# Patient Record
Sex: Female | Born: 1965
Health system: Southern US, Community
[De-identification: ages and names within clinical notes are randomized; demographics above are authoritative.]

## PROBLEM LIST (undated history)

## (undated) DIAGNOSIS — Z8489 Family history of other specified conditions: Secondary | ICD-10-CM

## (undated) DIAGNOSIS — I2510 Atherosclerotic heart disease of native coronary artery without angina pectoris: Secondary | ICD-10-CM

## (undated) DIAGNOSIS — F32A Depression, unspecified: Secondary | ICD-10-CM

## (undated) DIAGNOSIS — E119 Type 2 diabetes mellitus without complications: Secondary | ICD-10-CM

## (undated) DIAGNOSIS — E785 Hyperlipidemia, unspecified: Secondary | ICD-10-CM

## (undated) DIAGNOSIS — F419 Anxiety disorder, unspecified: Secondary | ICD-10-CM

## (undated) DIAGNOSIS — I10 Essential (primary) hypertension: Secondary | ICD-10-CM

## (undated) DIAGNOSIS — I255 Ischemic cardiomyopathy: Secondary | ICD-10-CM

## (undated) DIAGNOSIS — I219 Acute myocardial infarction, unspecified: Secondary | ICD-10-CM

## (undated) DIAGNOSIS — M797 Fibromyalgia: Secondary | ICD-10-CM

## (undated) DIAGNOSIS — F329 Major depressive disorder, single episode, unspecified: Secondary | ICD-10-CM

## (undated) DIAGNOSIS — K219 Gastro-esophageal reflux disease without esophagitis: Secondary | ICD-10-CM

## (undated) HISTORY — DX: Essential (primary) hypertension: I10

## (undated) HISTORY — PX: TONSILLECTOMY: SUR1361

## (undated) HISTORY — DX: Fibromyalgia: M79.7

## (undated) HISTORY — DX: Type 2 diabetes mellitus without complications: E11.9

## (undated) HISTORY — PX: TUBAL LIGATION: SHX77

## (undated) HISTORY — PX: HERNIA REPAIR: SHX51

---

## 2013-06-13 DIAGNOSIS — R42 Dizziness and giddiness: Secondary | ICD-10-CM

## 2013-09-28 ENCOUNTER — Ambulatory Visit (HOSPITAL_COMMUNITY): Payer: Commercial Indemnity | Admitting: Psychiatry

## 2013-10-10 ENCOUNTER — Ambulatory Visit (INDEPENDENT_AMBULATORY_CARE_PROVIDER_SITE_OTHER): Payer: 59 | Admitting: Psychiatry

## 2013-10-10 ENCOUNTER — Encounter (HOSPITAL_COMMUNITY): Payer: Self-pay | Admitting: Psychiatry

## 2013-10-10 VITALS — BP 130/80 | Ht 58.75 in | Wt 160.0 lb

## 2013-10-10 DIAGNOSIS — F418 Other specified anxiety disorders: Secondary | ICD-10-CM | POA: Insufficient documentation

## 2013-10-10 DIAGNOSIS — F332 Major depressive disorder, recurrent severe without psychotic features: Secondary | ICD-10-CM

## 2013-10-10 DIAGNOSIS — F411 Generalized anxiety disorder: Secondary | ICD-10-CM

## 2013-10-10 MED ORDER — CLONAZEPAM 0.5 MG PO TABS: 0.5000 mg | ORAL_TABLET | Freq: Every day | ORAL | Status: DC

## 2013-10-10 MED ORDER — DULOXETINE HCL 60 MG PO CPEP: 60.0000 mg | ORAL_CAPSULE | Freq: Every day | ORAL | Status: DC

## 2013-10-10 MED ORDER — DULOXETINE HCL 30 MG PO CPEP: 30.0000 mg | ORAL_CAPSULE | Freq: Every day | ORAL | Status: DC

## 2013-10-10 MED ORDER — TOPIRAMATE 25 MG PO CPSP: ORAL_CAPSULE | ORAL | Status: DC

## 2013-10-10 MED ORDER — TRAZODONE HCL 150 MG PO TABS: 150.0000 mg | ORAL_TABLET | Freq: Every day | ORAL | Status: DC
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
## 2013-10-17 ENCOUNTER — Telehealth (HOSPITAL_COMMUNITY): Payer: Self-pay

## 2013-10-17 NOTE — Telephone Encounter (Signed)
Left message 10:30 am

## 2013-10-30 ENCOUNTER — Ambulatory Visit (HOSPITAL_COMMUNITY): Payer: Self-pay | Admitting: Psychiatry

## 2013-11-09 ENCOUNTER — Encounter (HOSPITAL_COMMUNITY): Payer: Self-pay | Admitting: Psychiatry

## 2013-11-09 ENCOUNTER — Ambulatory Visit (INDEPENDENT_AMBULATORY_CARE_PROVIDER_SITE_OTHER): Payer: 59 | Admitting: Psychiatry

## 2013-11-09 VITALS — BP 120/80 | Ht <= 58 in | Wt 156.0 lb

## 2013-11-09 DIAGNOSIS — F418 Other specified anxiety disorders: Secondary | ICD-10-CM

## 2013-11-09 DIAGNOSIS — F332 Major depressive disorder, recurrent severe without psychotic features: Secondary | ICD-10-CM

## 2013-11-09 DIAGNOSIS — F411 Generalized anxiety disorder: Secondary | ICD-10-CM

## 2013-11-09 MED ORDER — ALPRAZOLAM 1 MG PO TABS: 1.0000 mg | ORAL_TABLET | Freq: Three times a day (TID) | ORAL | Status: AC
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
## 2013-12-07 ENCOUNTER — Ambulatory Visit (HOSPITAL_COMMUNITY): Payer: Self-pay | Admitting: Psychiatry

## 2013-12-14 ENCOUNTER — Ambulatory Visit (HOSPITAL_COMMUNITY): Payer: Self-pay | Admitting: Psychiatry

## 2014-06-17 ENCOUNTER — Telehealth (HOSPITAL_COMMUNITY): Payer: Self-pay | Admitting: *Deleted

## 2014-06-17 NOTE — Telephone Encounter (Signed)
Refill for Xanax cannot be sent. Last appt 11/09/13. Nothing scheduled. Needs to be seen to receive medication

## 2014-08-29 ENCOUNTER — Other Ambulatory Visit (HOSPITAL_COMMUNITY): Payer: Self-pay | Admitting: *Deleted

## 2014-08-29 NOTE — Telephone Encounter (Signed)
Called Amy Snow and lmtcb to call our office due to her pharmacy requesting her Klonopin 0.5 mg. Amy Snow last saw Dr. Harrington Challenger 11-09-2013. Pharmacy is aware that Amy Snow have to make an appt to get more refills.

## 2015-07-05 ENCOUNTER — Inpatient Hospital Stay (HOSPITAL_COMMUNITY)
Admission: RE | Admit: 2015-07-05 | Discharge: 2015-07-09 | DRG: 246 | Disposition: A | Discharge status: Home / Self Care | Payer: Self-pay | Source: Ambulatory Visit | Attending: Cardiovascular Disease | Admitting: Cardiovascular Disease

## 2015-07-05 ENCOUNTER — Encounter (HOSPITAL_COMMUNITY): Payer: Self-pay

## 2015-07-05 ENCOUNTER — Encounter (HOSPITAL_COMMUNITY)
Admission: RE | Disposition: A | Discharge status: Home / Self Care | Payer: Self-pay | Source: Ambulatory Visit | Attending: Cardiovascular Disease

## 2015-07-05 DIAGNOSIS — I2129 ST elevation (STEMI) myocardial infarction involving other sites: Principal | ICD-10-CM | POA: Diagnosis present

## 2015-07-05 DIAGNOSIS — M797 Fibromyalgia: Secondary | ICD-10-CM

## 2015-07-05 DIAGNOSIS — E781 Pure hyperglyceridemia: Secondary | ICD-10-CM | POA: Diagnosis present

## 2015-07-05 DIAGNOSIS — I10 Essential (primary) hypertension: Secondary | ICD-10-CM

## 2015-07-05 DIAGNOSIS — E119 Type 2 diabetes mellitus without complications: Secondary | ICD-10-CM

## 2015-07-05 DIAGNOSIS — I2109 ST elevation (STEMI) myocardial infarction involving other coronary artery of anterior wall: Secondary | ICD-10-CM

## 2015-07-05 DIAGNOSIS — E1165 Type 2 diabetes mellitus with hyperglycemia: Secondary | ICD-10-CM | POA: Diagnosis present

## 2015-07-05 DIAGNOSIS — F418 Other specified anxiety disorders: Secondary | ICD-10-CM | POA: Diagnosis present

## 2015-07-05 DIAGNOSIS — I509 Heart failure, unspecified: Secondary | ICD-10-CM

## 2015-07-05 DIAGNOSIS — I5021 Acute systolic (congestive) heart failure: Secondary | ICD-10-CM

## 2015-07-05 DIAGNOSIS — E785 Hyperlipidemia, unspecified: Secondary | ICD-10-CM

## 2015-07-05 DIAGNOSIS — I2119 ST elevation (STEMI) myocardial infarction involving other coronary artery of inferior wall: Secondary | ICD-10-CM | POA: Diagnosis present

## 2015-07-05 DIAGNOSIS — Z79899 Other long term (current) drug therapy: Secondary | ICD-10-CM

## 2015-07-05 DIAGNOSIS — K219 Gastro-esophageal reflux disease without esophagitis: Secondary | ICD-10-CM | POA: Diagnosis present

## 2015-07-05 DIAGNOSIS — I2510 Atherosclerotic heart disease of native coronary artery without angina pectoris: Secondary | ICD-10-CM

## 2015-07-05 DIAGNOSIS — I5181 Takotsubo syndrome: Secondary | ICD-10-CM | POA: Diagnosis present

## 2015-07-05 DIAGNOSIS — I2111 ST elevation (STEMI) myocardial infarction involving right coronary artery: Secondary | ICD-10-CM

## 2015-07-05 HISTORY — DX: Anxiety disorder, unspecified: F41.9

## 2015-07-05 HISTORY — DX: Gastro-esophageal reflux disease without esophagitis: K21.9

## 2015-07-05 HISTORY — DX: Hyperlipidemia, unspecified: E78.5

## 2015-07-05 HISTORY — DX: Depression, unspecified: F32.A

## 2015-07-05 HISTORY — PX: CARDIAC CATHETERIZATION: SHX172

## 2015-07-05 HISTORY — DX: Major depressive disorder, single episode, unspecified: F32.9

## 2015-07-05 SURGERY — LEFT HEART CATH AND CORONARY ANGIOGRAPHY

## 2015-07-05 MED ORDER — ONDANSETRON HCL 4 MG/2ML IJ SOLN
INTRAMUSCULAR | Status: DC | PRN
  Administered 2015-07-05: 4 mg via INTRAVENOUS

## 2015-07-05 MED ORDER — HEPARIN (PORCINE) IN NACL 2-0.9 UNIT/ML-% IJ SOLN
INTRAMUSCULAR | Status: AC
  Filled 2015-07-05: qty 1000

## 2015-07-05 MED ORDER — VERAPAMIL HCL 2.5 MG/ML IV SOLN
INTRAVENOUS | Status: AC
  Filled 2015-07-05: qty 2

## 2015-07-05 MED ORDER — GEMFIBROZIL 600 MG PO TABS
600.0000 mg | ORAL_TABLET | Freq: Two times a day (BID) | ORAL | Status: DC
  Filled 2015-07-05: qty 1

## 2015-07-05 MED ORDER — TICAGRELOR 90 MG PO TABS
ORAL_TABLET | ORAL | Status: AC
  Filled 2015-07-05: qty 1

## 2015-07-05 MED ORDER — SODIUM CHLORIDE 0.9 % IJ SOLN
3.0000 mL | Freq: Two times a day (BID) | INTRAMUSCULAR | Status: DC
  Administered 2015-07-06 – 2015-07-08 (×6): 3 mL via INTRAVENOUS

## 2015-07-05 MED ORDER — NITROGLYCERIN 1 MG/10 ML FOR IR/CATH LAB
INTRA_ARTERIAL | Status: DC | PRN
  Administered 2015-07-05: 5 mL

## 2015-07-05 MED ORDER — TICAGRELOR 90 MG PO TABS
ORAL_TABLET | ORAL | Status: DC | PRN
  Administered 2015-07-05: 180 mg via ORAL

## 2015-07-05 MED ORDER — DULOXETINE HCL 60 MG PO CPEP: 60.0000 mg | ORAL_CAPSULE | Freq: Every day | ORAL | Status: DC

## 2015-07-05 MED ORDER — SODIUM CHLORIDE 0.9 % IV SOLN: 250.0000 mL | INTRAVENOUS | Status: DC | PRN

## 2015-07-05 MED ORDER — HYDROCHLOROTHIAZIDE 25 MG PO TABS: 25.0000 mg | ORAL_TABLET | Freq: Every day | ORAL | Status: DC

## 2015-07-05 MED ORDER — RADIAL COCKTAIL (HEPARIN/VERAPAMIL/LIDOCAINE/NITRO)
Status: DC | PRN
  Administered 2015-07-05: 1 via INTRA_ARTERIAL

## 2015-07-05 MED ORDER — ONDANSETRON HCL 4 MG/2ML IJ SOLN
INTRAMUSCULAR | Status: AC
  Filled 2015-07-05: qty 2

## 2015-07-05 MED ORDER — DULOXETINE HCL 30 MG PO CPEP: 30.0000 mg | ORAL_CAPSULE | Freq: Every day | ORAL | Status: DC

## 2015-07-05 MED ORDER — TRAZODONE HCL 150 MG PO TABS
150.0000 mg | ORAL_TABLET | Freq: Every day | ORAL | Status: DC
  Administered 2015-07-06 – 2015-07-09 (×3): 150 mg via ORAL
  Filled 2015-07-05 (×5): qty 1

## 2015-07-05 MED ORDER — PNEUMOCOCCAL VAC POLYVALENT 25 MCG/0.5ML IJ INJ
0.5000 mL | INJECTION | INTRAMUSCULAR | Status: DC
  Filled 2015-07-05: qty 0.5

## 2015-07-05 MED ORDER — SODIUM CHLORIDE 0.9 % IV SOLN
1.0000 mg/kg/h | INTRAVENOUS | Status: AC
  Administered 2015-07-05: 1 mg/kg/h via INTRAVENOUS
  Filled 2015-07-05: qty 250

## 2015-07-05 MED ORDER — SODIUM CHLORIDE 0.9 % WEIGHT BASED INFUSION
1.0000 mL/kg/h | INTRAVENOUS | Status: AC
  Administered 2015-07-05: 1 mL/kg/h via INTRAVENOUS

## 2015-07-05 MED ORDER — TRAMADOL HCL 50 MG PO TABS: 50.0000 mg | ORAL_TABLET | Freq: Two times a day (BID) | ORAL | Status: DC | PRN

## 2015-07-05 MED ORDER — TICAGRELOR 90 MG PO TABS
90.0000 mg | ORAL_TABLET | Freq: Two times a day (BID) | ORAL | Status: DC
  Administered 2015-07-06 – 2015-07-09 (×7): 90 mg via ORAL
  Filled 2015-07-05 (×8): qty 1

## 2015-07-05 MED ORDER — ATORVASTATIN CALCIUM 20 MG PO TABS: 20.0000 mg | ORAL_TABLET | Freq: Every day | ORAL | Status: DC

## 2015-07-05 MED ORDER — HEPARIN SODIUM (PORCINE) 1000 UNIT/ML IJ SOLN
INTRAMUSCULAR | Status: AC
  Filled 2015-07-05: qty 1

## 2015-07-05 MED ORDER — LIDOCAINE HCL (PF) 1 % IJ SOLN
INTRAMUSCULAR | Status: AC
  Filled 2015-07-05: qty 30

## 2015-07-05 MED ORDER — BIVALIRUDIN BOLUS VIA INFUSION - CUPID
INTRAVENOUS | Status: DC | PRN
  Administered 2015-07-05: 53.25 mg via INTRAVENOUS
  Administered 2015-07-05: 124.25 mg via INTRAVENOUS

## 2015-07-05 MED ORDER — ASPIRIN 81 MG PO CHEW
81.0000 mg | CHEWABLE_TABLET | Freq: Every day | ORAL | Status: DC
  Administered 2015-07-06 – 2015-07-09 (×4): 81 mg via ORAL
  Filled 2015-07-05 (×4): qty 1

## 2015-07-05 MED ORDER — SODIUM CHLORIDE 0.9 % IJ SOLN: 3.0000 mL | INTRAMUSCULAR | Status: DC | PRN

## 2015-07-05 MED ORDER — INSULIN DETEMIR 100 UNIT/ML ~~LOC~~ SOLN
45.0000 [IU] | Freq: Two times a day (BID) | SUBCUTANEOUS | Status: DC
  Administered 2015-07-05 – 2015-07-09 (×7): 45 [IU] via SUBCUTANEOUS
  Filled 2015-07-05 (×9): qty 0.45

## 2015-07-05 MED ORDER — ONDANSETRON HCL 4 MG/2ML IJ SOLN
4.0000 mg | Freq: Four times a day (QID) | INTRAMUSCULAR | Status: DC | PRN
  Administered 2015-07-06 (×3): 4 mg via INTRAVENOUS
  Filled 2015-07-05 (×3): qty 2

## 2015-07-05 MED ORDER — ACETAMINOPHEN 325 MG PO TABS: 650.0000 mg | ORAL_TABLET | ORAL | Status: DC | PRN

## 2015-07-05 MED ORDER — MORPHINE SULFATE 2 MG/ML IJ SOLN
2.0000 mg | INTRAMUSCULAR | Status: DC | PRN
  Administered 2015-07-06: 2 mg via INTRAVENOUS
  Filled 2015-07-05: qty 1

## 2015-07-05 MED ORDER — BIVALIRUDIN 250 MG IV SOLR
INTRAVENOUS | Status: AC
  Filled 2015-07-05: qty 250

## 2015-07-05 MED ORDER — IOHEXOL 350 MG/ML SOLN
INTRAVENOUS | Status: DC | PRN
  Administered 2015-07-05: 135 mL via INTRA_ARTERIAL

## 2015-07-05 SURGICAL SUPPLY — 19 items
BALLN MINITREK RX 2.0X12 (BALLOONS) ×3
BALLOON MINITREK RX 2.0X12 (BALLOONS) ×2 IMPLANT
CATH INFINITI 5FR ANG PIGTAIL (CATHETERS) ×3 IMPLANT
CATH INFINITI JR4 5F (CATHETERS) ×3 IMPLANT
CATH OPTITORQUE TIG 4.0 5F (CATHETERS) ×3 IMPLANT
CATH VISTA GUIDE 6FR JR4 (CATHETERS) ×3 IMPLANT
DEVICE RAD COMP TR BAND LRG (VASCULAR PRODUCTS) ×3 IMPLANT
ELECT DEFIB PAD ADLT CADENCE (PAD) ×3 IMPLANT
GLIDESHEATH SLEND A-KIT 6F 22G (SHEATH) ×3 IMPLANT
KIT ENCORE 26 ADVANTAGE (KITS) ×3 IMPLANT
KIT HEART LEFT (KITS) ×3 IMPLANT
PACK CARDIAC CATHETERIZATION (CUSTOM PROCEDURE TRAY) ×3 IMPLANT
STENT SYNERGY DES 2.25X16 (Permanent Stent) ×3 IMPLANT
SYR MEDRAD MARK V 150ML (SYRINGE) ×3 IMPLANT
TRANSDUCER W/STOPCOCK (MISCELLANEOUS) ×3 IMPLANT
TUBING CIL FLEX 10 FLL-RA (TUBING) ×3 IMPLANT
WIRE ASAHI PROWATER 180CM (WIRE) ×3 IMPLANT
WIRE HI TORQ VERSACORE-J 145CM (WIRE) ×3 IMPLANT
WIRE SAFE-T 1.5MM-J .035X260CM (WIRE) ×3 IMPLANT

## 2015-07-06 ENCOUNTER — Inpatient Hospital Stay (HOSPITAL_COMMUNITY): Payer: Commercial Indemnity

## 2015-07-06 ENCOUNTER — Encounter (HOSPITAL_COMMUNITY): Payer: Self-pay | Admitting: Internal Medicine

## 2015-07-06 DIAGNOSIS — R1013 Epigastric pain: Secondary | ICD-10-CM

## 2015-07-06 DIAGNOSIS — E785 Hyperlipidemia, unspecified: Secondary | ICD-10-CM | POA: Diagnosis present

## 2015-07-06 DIAGNOSIS — E781 Pure hyperglyceridemia: Secondary | ICD-10-CM

## 2015-07-06 DIAGNOSIS — I10 Essential (primary) hypertension: Secondary | ICD-10-CM | POA: Diagnosis present

## 2015-07-06 DIAGNOSIS — I2510 Atherosclerotic heart disease of native coronary artery without angina pectoris: Secondary | ICD-10-CM

## 2015-07-06 DIAGNOSIS — I509 Heart failure, unspecified: Secondary | ICD-10-CM

## 2015-07-06 DIAGNOSIS — E119 Type 2 diabetes mellitus without complications: Secondary | ICD-10-CM

## 2015-07-06 DIAGNOSIS — M797 Fibromyalgia: Secondary | ICD-10-CM | POA: Diagnosis present

## 2015-07-06 DIAGNOSIS — E1159 Type 2 diabetes mellitus with other circulatory complications: Secondary | ICD-10-CM

## 2015-07-06 LAB — GLUCOSE, CAPILLARY
Glucose-Capillary: 338 mg/dL — ABNORMAL HIGH (ref 65–99)
Glucose-Capillary: 201 mg/dL — ABNORMAL HIGH (ref 65–99)
Glucose-Capillary: 187 mg/dL — ABNORMAL HIGH (ref 65–99)
Glucose-Capillary: 178 mg/dL — ABNORMAL HIGH (ref 65–99)
Glucose-Capillary: 269 mg/dL — ABNORMAL HIGH (ref 65–99)

## 2015-07-06 LAB — LIPID PANEL
Cholesterol: 423 mg/dL — ABNORMAL HIGH (ref 0–200)
LDL Cholesterol: UNDETERMINED mg/dL (ref 0–99)
Triglycerides: 1303 mg/dL — ABNORMAL HIGH (ref <150)
VLDL: UNDETERMINED mg/dL (ref 0–40)

## 2015-07-06 LAB — HEPATIC FUNCTION PANEL
ALT: 12 U/L — ABNORMAL LOW (ref 14–54)
AST: 59 U/L — ABNORMAL HIGH (ref 15–41)
Albumin: 3.6 g/dL (ref 3.5–5.0)
Alkaline Phosphatase: 85 U/L (ref 38–126)
Bilirubin, Direct: <0.1 mg/dL — ABNORMAL LOW (ref 0.1–0.5)
Total Bilirubin: 0.4 mg/dL (ref 0.3–1.2)
Total Protein: 6.4 g/dL — ABNORMAL LOW (ref 6.5–8.1)

## 2015-07-06 LAB — BASIC METABOLIC PANEL
Anion gap: 11 (ref 5–15)
BUN: 12 mg/dL (ref 6–20)
CO2: 25 mmol/L (ref 22–32)
Calcium: 9 mg/dL (ref 8.9–10.3)
Chloride: 96 mmol/L — ABNORMAL LOW (ref 101–111)
Creatinine, Ser: 0.45 mg/dL (ref 0.44–1.00)
GFR calc Af Amer: >60 mL/min (ref >60)
GFR calc non Af Amer: >60 mL/min (ref >60)
Glucose, Bld: 268 mg/dL — ABNORMAL HIGH (ref 65–99)
Potassium: 4.1 mmol/L (ref 3.5–5.1)
Sodium: 132 mmol/L — ABNORMAL LOW (ref 135–145)

## 2015-07-06 LAB — TROPONIN I
Troponin I: 6.72 ng/mL (ref <0.031)
Troponin I: 18.31 ng/mL (ref <0.031)
Troponin I: 17.78 ng/mL (ref <0.031)

## 2015-07-06 LAB — CBC
HCT: 38.9 % (ref 36.0–46.0)
Hemoglobin: 13.1 g/dL (ref 12.0–15.0)
MCH: 28.9 pg (ref 26.0–34.0)
MCHC: 33.7 g/dL (ref 30.0–36.0)
MCV: 85.9 fL (ref 78.0–100.0)
Platelets: 207 10*3/uL (ref 150–400)
RBC: 4.53 MIL/uL (ref 3.87–5.11)
RDW: 14.2 % (ref 11.5–15.5)
WBC: 7.8 10*3/uL (ref 4.0–10.5)

## 2015-07-06 LAB — AMYLASE: Amylase: 28 U/L (ref 28–100)

## 2015-07-06 LAB — LIPASE, BLOOD: Lipase: 17 U/L — ABNORMAL LOW (ref 22–51)

## 2015-07-06 LAB — SALICYLATE LEVEL: Salicylate Lvl: <4 mg/dL (ref 2.8–30.0)

## 2015-07-06 LAB — MRSA PCR SCREENING: MRSA by PCR: NEGATIVE

## 2015-07-06 LAB — MAGNESIUM: Magnesium: 1.9 mg/dL (ref 1.7–2.4)

## 2015-07-06 LAB — BRAIN NATRIURETIC PEPTIDE: B Natriuretic Peptide: 274 pg/mL — ABNORMAL HIGH (ref 0.0–100.0)

## 2015-07-06 MED ORDER — TOPIRAMATE 25 MG PO TABS
25.0000 mg | ORAL_TABLET | Freq: Every day | ORAL | Status: DC
  Administered 2015-07-06 – 2015-07-09 (×4): 25 mg via ORAL
  Filled 2015-07-06 (×4): qty 1

## 2015-07-06 MED ORDER — SODIUM CHLORIDE 0.9 % IJ SOLN
3.0000 mL | Freq: Two times a day (BID) | INTRAMUSCULAR | Status: DC
Start: 2015-07-06 — End: 2015-07-09
  Administered 2015-07-06 (×2): 3 mL via INTRAVENOUS
  Administered 2015-07-07: 6 mL via INTRAVENOUS
  Administered 2015-07-07 – 2015-07-08 (×3): 3 mL via INTRAVENOUS

## 2015-07-06 MED ORDER — FENOFIBRATE 160 MG PO TABS
160.0000 mg | ORAL_TABLET | Freq: Every day | ORAL | Status: DC
  Administered 2015-07-06 – 2015-07-09 (×4): 160 mg via ORAL
  Filled 2015-07-06 (×4): qty 1

## 2015-07-06 MED ORDER — ASPIRIN EC 81 MG PO TBEC: 81.0000 mg | DELAYED_RELEASE_TABLET | Freq: Every day | ORAL | Status: DC

## 2015-07-06 MED ORDER — ACETAMINOPHEN 650 MG RE SUPP: 650.0000 mg | Freq: Four times a day (QID) | RECTAL | Status: DC | PRN

## 2015-07-06 MED ORDER — INSULIN ASPART 100 UNIT/ML ~~LOC~~ SOLN
0.0000 [IU] | Freq: Three times a day (TID) | SUBCUTANEOUS | Status: DC
  Administered 2015-07-06 – 2015-07-07 (×4): 3 [IU] via SUBCUTANEOUS
  Administered 2015-07-08: 5 [IU] via SUBCUTANEOUS
  Administered 2015-07-09 (×2): 3 [IU] via SUBCUTANEOUS

## 2015-07-06 MED ORDER — PANTOPRAZOLE SODIUM 40 MG PO TBEC
40.0000 mg | DELAYED_RELEASE_TABLET | Freq: Every day | ORAL | Status: DC
  Administered 2015-07-06 – 2015-07-09 (×4): 40 mg via ORAL
  Filled 2015-07-06 (×4): qty 1

## 2015-07-06 MED ORDER — ATORVASTATIN CALCIUM 80 MG PO TABS
80.0000 mg | ORAL_TABLET | Freq: Every day | ORAL | Status: DC
  Administered 2015-07-06 – 2015-07-08 (×3): 80 mg via ORAL
  Filled 2015-07-06 (×4): qty 1

## 2015-07-06 MED ORDER — SUCRALFATE 1 GM/10ML PO SUSP
1.0000 g | Freq: Three times a day (TID) | ORAL | Status: DC
  Administered 2015-07-06 – 2015-07-09 (×12): 1 g via ORAL
  Filled 2015-07-06 (×16): qty 10

## 2015-07-06 MED ORDER — SPIRONOLACTONE 12.5 MG HALF TABLET
12.5000 mg | ORAL_TABLET | Freq: Every day | ORAL | Status: DC
  Administered 2015-07-06 – 2015-07-08 (×3): 12.5 mg via ORAL
  Filled 2015-07-06 (×3): qty 1

## 2015-07-06 MED ORDER — PANTOPRAZOLE SODIUM 40 MG IV SOLR: 40.0000 mg | Freq: Every day | INTRAVENOUS | Status: DC

## 2015-07-06 MED ORDER — ACETAMINOPHEN 325 MG PO TABS
650.0000 mg | ORAL_TABLET | Freq: Four times a day (QID) | ORAL | Status: DC | PRN
  Administered 2015-07-07: 650 mg via ORAL
  Filled 2015-07-06: qty 2

## 2015-07-06 MED ORDER — TOPIRAMATE 25 MG PO CPSP: 25.0000 mg | ORAL_CAPSULE | Freq: Every day | ORAL | Status: DC

## 2015-07-06 MED ORDER — CARVEDILOL 3.125 MG PO TABS
3.1250 mg | ORAL_TABLET | Freq: Two times a day (BID) | ORAL | Status: DC
  Administered 2015-07-07 – 2015-07-09 (×4): 3.125 mg via ORAL
  Filled 2015-07-06 (×6): qty 1

## 2015-07-06 MED ORDER — LOSARTAN POTASSIUM 25 MG PO TABS
12.5000 mg | ORAL_TABLET | Freq: Every day | ORAL | Status: DC
  Administered 2015-07-07 – 2015-07-08 (×2): 12.5 mg via ORAL
  Filled 2015-07-06: qty 0.5
  Filled 2015-07-06: qty 1
  Filled 2015-07-06: qty 0.5

## 2015-07-06 MED ORDER — OMEGA-3-ACID ETHYL ESTERS 1 G PO CAPS
1.0000 g | ORAL_CAPSULE | Freq: Two times a day (BID) | ORAL | Status: DC
  Administered 2015-07-06 – 2015-07-09 (×7): 1 g via ORAL
  Filled 2015-07-06 (×8): qty 1

## 2015-07-06 MED ORDER — INSULIN ASPART 100 UNIT/ML ~~LOC~~ SOLN
0.0000 [IU] | Freq: Every day | SUBCUTANEOUS | Status: DC
  Administered 2015-07-06: 3 [IU] via SUBCUTANEOUS
  Administered 2015-07-07 – 2015-07-08 (×2): 2 [IU] via SUBCUTANEOUS

## 2015-07-06 NOTE — Progress Notes (Signed)
  Echocardiogram 2D Echocardiogram has been performed.  Amy Snow 07/06/2015, 9:34 AM

## 2015-07-06 NOTE — Progress Notes (Signed)
LCSW aware of social work consult for assistance with medications. Will defer to case management for assistance.  If SW needs arise, please reconsult.  Lane Hacker, MSW Clinical Social Work: Emergency Room [phone]

## 2015-07-06 NOTE — Progress Notes (Signed)
CRITICAL VALUE ALERT  Critical value received:  Trop 6.72  Date of notification:  07/06/2015  Time of notification:  0040  Critical value read back: Yes  Nurse who received alert: Diannia Ruder RN  MD notified (1st page):  Rosanna Randy  Time of first page:  0045  MD notified (2nd page):  Time of second page:  Responding MD:  Dr. Herbie Baltimore  Time MD responded: [phone]

## 2015-07-06 NOTE — H&P (Addendum)
Patient ID: Amy Snow MRN: [id number], DOB/AGE: 1966-08-04   Admit date: 07/05/2015  Primary Physician:  Dr. Nadara Mustard in [place], [place]  Primary Cardiologist:  NA  CC:  CP  HPI:  Amy Snow is a 49 yo F w a h/o DM type 2, HTN, HLD, & Fibromyalgia who was transferred from [facility] this evening where she presented at 18:00 with 4 hours of nearly constant chest pain that radiated into her back & arms bilaterally associated with dyspnea.  She was resting at the time that it started.  She had never experienced similar pain previously with her GERD much different in nature.  She attempted self-treatment with antacids, a bath, & a heating pad without improvement. At the OSH, she was noted to have 1 mm inferior ST elevation.  Prior to transfer, she was she was treated with ASA 324, Heparin 5000 IV, Ativan 1 mgIV, Morphine 4 mg IV, Nitro-Bid 2% 1", & Zofran 4 mg IV.  With direct transfer to the cardiac cath lab, her culprit vessel was identified as the PDA for which she received a 2.25x16 Synergy DES.    Of note, the pt has been without medical insurance for the past year due to separating from her husband.  She makes too much for Medicaid but could not afford Obamacare.  She was recently made eligible for insurance through her work, which will be pending in the next week.  Accordingly, she has not seen her PCP Dr. Nadara Mustard in [place] recently & has been without ALL medications for the past year & has not been checking her blood glucose in the preceding couple of months.  For control of her Fibromyalgia, which causes her significant shoulder pain, she has been taking Tylenol 2 g BID & Ibuprofen 800 mg TID.  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  She has mild LE swelling at the end of her work shifts but denied dyspnea, orthopnea, paroxysmal nocturnal dyspnea, lightheadedness, or palpitations.    Relevant Data - Cardiac catheterization  (tonight):  mLAD to dLAD 80%, D2 90%, OM1 90%, RPDA 100%, RPLB-1 90%, RPLB-2 90%, EF < 25% - EKG (OSH):  NSR, with 2 mm inferior ST elevation - CXR (OSH):  Unremarkable - Labs (OSH):  CBC, INR wnl.  Other labs not visible.    Problem List  Patient Active Problem List   Diagnosis Date Noted  . Diabetes mellitus 07/06/2015  . Acute heart failure 07/06/2015  . Essential hypertension 07/06/2015  . Hyperlipidemia 07/06/2015  . Fibromyalgia 07/06/2015  . Acute ST elevation myocardial infarction (STEMI) involving other coronary artery of anterior wall 07/05/2015  . ST elevation myocardial infarction (STEMI) of inferior wall   . Depression with anxiety 10/10/2013   Past Medical History  Diagnosis Date  . Diabetes mellitus, type II   . Fibromyalgia   . Hypertension   . Hyperlipidemia   . Anxiety and depression   . GERD (gastroesophageal reflux disease)     Past Surgical History  Procedure Laterality Date  . Hernia repair    . Tonsillectomy    . Cesarean section    . Tubal ligation      Allergies  No Known Allergies  Home Medications  Prior to Admission medications   Medication Sig Start Date End Date Taking? Authorizing Provider  omeprazole (PRILOSEC) 20 MG capsule Take 40 mg by mouth 2 (two) times daily before a meal.   Yes Historical Provider,  MD  atorvastatin (LIPITOR) 20 MG tablet Take 20 mg by mouth daily.    Historical Provider, MD  DULoxetine (CYMBALTA) 30 MG capsule Take 1 capsule (30 mg total) by mouth daily. 10/10/13   Cloria Spring, MD  DULoxetine (CYMBALTA) 60 MG capsule Take 1 capsule (60 mg total) by mouth daily. 10/10/13   Cloria Spring, MD  gemfibrozil (LOPID) 600 MG tablet Take 600 mg by mouth 2 (two) times daily before a meal.    Historical Provider, MD  hydrochlorothiazide (HYDRODIURIL) 25 MG tablet Take 25 mg by mouth daily.    Historical Provider, MD  insulin detemir (LEVEMIR) 100 UNIT/ML injection Inject 45 Units into the skin 2 (two) times daily.     Historical Provider, MD  metFORMIN (GLUCOPHAGE) 1000 MG tablet Take 1,000 mg by mouth 2 (two) times daily with a meal.    Historical Provider, MD  topiramate (TOPAMAX) 25 MG capsule Take two tablets at bedtime 10/10/13   Cloria Spring, MD  traMADol (ULTRAM) 50 MG tablet Take 50 mg by mouth every 12 (twelve) hours as needed.    Historical Provider, MD  traZODone (DESYREL) 150 MG tablet Take 1 tablet (150 mg total) by mouth at bedtime. 10/10/13   Cloria Spring, MD   Family History  Family History  Problem Relation Age of Onset  . Alcohol abuse Mother   . Anxiety disorder Father   . Depression Father   . Alcohol abuse Father   . Anxiety disorder Sister   . Depression Sister   . Depression Maternal Aunt   . Depression Cousin    Social History  Social History   Social History  . Marital Status: Married    Spouse Name: N/A  . Number of Children: 2  . 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 Smoking status: Never Smoker   . Smokeless tobacco: Not on file  . Alcohol Use: No  . Drug Use: No  . Sexual Activity: Yes   Other Topics Concern  . Not on file   Social History Narrative   She is separated from her husband & lives with her boyfriend.  She has two children 19 & 26 as well as three grandchildren.  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      Review of Systems General:  No chills, fever, night sweats or weight changes.  Cardiovascular:  Positive for chest pain & dyspnea.  No edema, orthopnea, palpitations, paroxysmal nocturnal dyspnea. Dermatological: No rash, lesions/masses Respiratory: No cough Urologic: No hematuria, dysuria Abdominal:   No nausea, vomiting, diarrhea, bright red blood per rectum, melena, or hematemesis Neurologic:  No visual changes, wkns, changes in mental status. All other systems reviewed and are otherwise negative except as noted above.  Physical Exam  Blood pressure 115/84,  pulse 0, temperature 97.5 F (36.4 C), temperature source Oral, resp. rate 18, height 4\' 11"  (1.499 m), weight 158 lb 8.2 oz (71.9 kg), SpO2 99 %.  General: Pleasant, NAD Psych: Normal affect. Neuro: Alert and oriented X 3. Moves all extremities spontaneously. HEENT: Normal  Neck: Supple without bruits or JVD. Lungs:  Resp regular and unlabored, CTA. Heart: RRR no s3, s4, or murmurs. Abdomen: Soft, non-tender, non-distended, BS + x 4.  Extremities: No clubbing, cyanosis or edema. DP/PT/Radials 2+ and equal bilaterally.  Labs  Troponin (Point of Care Test) No results for input(s): TROPIPOC in the last 72  hours.  Recent Labs  07/05/15 2259  TROPONINI 6.72*   No results found for: WBC, HGB, HCT, MCV, PLT No results for input(s): NA, K, CL, CO2, BUN, CREATININE, CALCIUM, PROT, BILITOT, ALKPHOS, ALT, AST, GLUCOSE in the last 168 hours.  Invalid input(s): LABALBU No results found for: CHOL, HDL, LDLCALC, TRIG No results found for: DDIMER   Radiology/Studies  No results found.  A/P:  49 yo F w a h/o DM, HTN, HLD, & Fibromyalgia presented this evening with inferior STEMI.    # Inferior STEMI - Appropriately reperfused. - Have initiated ASA & Ticagrelor.   - Based on the severity of her new LV dysfunction, it appears it was Dr. Kennon Holter preference to hold off on BB.   - May consider staged intervention of the LCx, LAD, & Diagonal branches.    # Acute systolic heart failure, likely ischemic in etiology - She appears euvolemic. - Have ordered a BNP & TTE. - As she is not decompensated, can likely initiate BB quickly. - Additionally, based on her BMP, will plan for ACE +/- Spironolactone.    # h/o DM - Presumed to be extremely uncontrolled given her lack of medications in the past year.  BG in the 300's on arrival.   - Check a HgbA1c.   - Her Levemir was resumed at her prior dose of 45 units BID.  This may need to be adjusted.    # h/o HTN - Normotensive.  Her PCP had stopped her  HCTZ prior to her losing her insurance.   - As per above, will plan to initiate BB & Lisinopril ASAP.    # h/o HLD  - Will check LFT's & Lipid profile. - In an attempt to consolidate her prior medications as she is currently without insurance, will d/c her Gemfibrozil & up-titrate her Atorvastatin. She denied previous myalgias with Atorvastatin.    # h/o Fibromyalgia - Educated her on the dangers of such high quantities of OTC analgesics.   - Will check a NAC level & LFT's given concern for her extremely large Acetaminophen. - No further NSAID's with heart failure.   - In an effort to simplify her medication list as she is currently without insurance, will only resume her Topamax as she stated that the Tramadol was not as helpful.    # h/o Depression & Anxiety - Will defer to outpatient management & resumption of SSRI.    # PPX - Once her BMP is available, will plan for Lovenox.  PPI.    # Full code  Signed, Alfonso Ramus

## 2015-07-06 NOTE — Progress Notes (Signed)
07/06/2015 0600 TR band removed and dssg applied. Instructions for care given. Verbalized understanding. Makaylia Hewett, Carolynn Comment

## 2015-07-06 NOTE — Progress Notes (Signed)
Subjective:   49 yo F w a h/o DM type 2, HTN, HLD, & Fibromyalgia who was transferred from [facility] on 8/13 with inferior STEMI in setting of being out of her meds for over 1 year due to lack of insurance.   Cath with 3V CAD:  1st RPLB-1 lesion, 90% stenosed.  1st RPLB-2 lesion, 90% stenosed.  Mid LAD to Dist LAD lesion, 80% stenosed.  Ost 2nd Diag to 2nd Diag lesion, 90% stenosed.  2nd Diag lesion, 90% stenosed.  1st Mrg lesion, 90% stenosed.  RPDA lesion, 100% stenosed. There is a 0% residual stenosis post intervention.  There is severe left ventricular systolic dysfunction.  EF 25%   Treated with PCI/Stentig of RPDA with consideration for staged intervention of the circumflex, LAD and diagonal branches. Feels ok. C/o GERD. No CP or SOB    Intake/Output Summary (Last 24 hours) at 07/06/15 1225 Last data filed at 07/06/15 1202  Gross per 24 hour  Intake 349.66 ml  Output   2400 ml  Net -2050.34 ml    Current meds: . aspirin  81 mg Oral Daily  . atorvastatin  80 mg Oral q1800  . insulin detemir  45 Units Subcutaneous BID  . pantoprazole  40 mg Oral Daily  . pneumococcal 23 valent vaccine  0.5 mL Intramuscular Tomorrow-1000  . sodium chloride  3 mL Intravenous Q12H  . sodium chloride  3 mL Intravenous Q12H  . ticagrelor  90 mg Oral BID  . topiramate  25 mg Oral Daily  . traZODone  150 mg Oral QHS   Infusions:     Objective:  Blood pressure 108/78, pulse 0, temperature 98.2 F (36.8 C), temperature source Oral, resp. rate 25, height 4\' 11"  (1.499 m), weight 71.9 kg (158 lb 8.2 oz), SpO2 100 %. Weight change:   Physical Exam: General:  Fatigued appearing. No resp difficulty HEENT: normal Neck: supple. JVP flat . Carotids 2+ bilat; no bruits. No lymphadenopathy or thryomegaly appreciated. Cor: PMI nondisplaced. Regular rate & rhythm. No rubs, gallops or murmurs. Lungs: clear Abdomen: soft, tender in epigastrum, nondistended. No  hepatosplenomegaly. No bruits or masses. Good bowel sounds. Extremities: no cyanosis, clubbing, rash, edema Neuro: alert & orientedx3, cranial nerves grossly intact. moves all 4 extremities w/o difficulty. Affect pleasant  Telemetry: SR  Lab Results: Basic Metabolic Panel:  Recent Labs Lab 07/06/15 0418  NA 132*  K 4.1  CL 96*  CO2 25  GLUCOSE 268*  BUN 12  CREATININE 0.45  CALCIUM 9.0  MG 1.9   Liver Function Tests:  Recent Labs Lab 07/06/15 0418  AST 59*  ALT 12*  ALKPHOS 85  BILITOT 0.4  PROT 6.4*  ALBUMIN 3.6   No results for input(s): LIPASE, AMYLASE in the last 168 hours. No results for input(s): AMMONIA in the last 168 hours. CBC:  Recent Labs Lab 07/06/15 0418  WBC 7.8  HGB 13.1  HCT 38.9  MCV 85.9  PLT 207   Cardiac Enzymes:  Recent Labs Lab 07/05/15 2259 07/06/15 0418 07/06/15 1030  TROPONINI 6.72* 18.31* 17.78*   BNP: Invalid input(s): POCBNP CBG:  Recent Labs Lab 07/06/15 0117 07/06/15 1004  GLUCAP 338* 201*   Microbiology: No results found for: CULT No results for input(s): CULT, SDES in the last 168 hours.  Imaging: No results found.   ASSESSMENT:  1) Inferior STEMI/with 3vCAD     --peak trop 18.3     --s/p PTCA/stent RPDA 3/66 2) Acute systolic HF EF 44-03%  by echo 07/06/15     - volume status ok. Start HF meds 3) DM2, poorly controlled 4) HTN  5) Fibromyalgia 6) Hyperlipidemia with severe hypertriglyceridemia (TG 1303) 7) Ab pain/GERD - on PPI. Will add carafate. Given hyperTG will check for pnacreatitis  PLAN/DISCUSSION:  Stable post PCI of RCA. Will need staged intervention of other CAD per interventional team. Continue ASA, b-blocker, statin and add ACE due to low EF. Add spiro as tolerated. She has hyperTG in setting of poorly controlled DM2. Insulin started. Check HGBA1c. Get DM2 consult. Says she can't tolerate metformin well.  Treat TGs with high-dose statin, fibrate and fish oil. Check amylase and lipase.     Can go to SDU. CR and case manager consult.   LOS: 1 day  Glori Bickers, MD 07/06/2015, 12:25 PM

## 2015-07-07 ENCOUNTER — Encounter (HOSPITAL_COMMUNITY): Payer: Self-pay | Admitting: Cardiovascular Disease

## 2015-07-07 DIAGNOSIS — I959 Hypotension, unspecified: Secondary | ICD-10-CM

## 2015-07-07 LAB — POCT I-STAT, CHEM 8
BUN: 17 mg/dL (ref 6–20)
Calcium, Ion: 1.22 mmol/L (ref 1.12–1.23)
Chloride: 103 mmol/L (ref 101–111)
Creatinine, Ser: 0.6 mg/dL (ref 0.44–1.00)
Glucose, Bld: 376 mg/dL — ABNORMAL HIGH (ref 65–99)
HCT: 38 % (ref 36.0–46.0)
Hemoglobin: 12.9 g/dL (ref 12.0–15.0)
Potassium: 4.2 mmol/L (ref 3.5–5.1)
Sodium: 134 mmol/L — ABNORMAL LOW (ref 135–145)
TCO2: 21 mmol/L (ref 0–100)

## 2015-07-07 LAB — BASIC METABOLIC PANEL
Anion gap: 11 (ref 5–15)
BUN: 11 mg/dL (ref 6–20)
CO2: 25 mmol/L (ref 22–32)
Calcium: 8.9 mg/dL (ref 8.9–10.3)
Chloride: 99 mmol/L — ABNORMAL LOW (ref 101–111)
Creatinine, Ser: 0.57 mg/dL (ref 0.44–1.00)
GFR calc Af Amer: >60 mL/min (ref >60)
GFR calc non Af Amer: >60 mL/min (ref >60)
Glucose, Bld: 182 mg/dL — ABNORMAL HIGH (ref 65–99)
Potassium: 3.5 mmol/L (ref 3.5–5.1)
Sodium: 135 mmol/L (ref 135–145)

## 2015-07-07 LAB — GLUCOSE, CAPILLARY
Glucose-Capillary: 185 mg/dL — ABNORMAL HIGH (ref 65–99)
Glucose-Capillary: 154 mg/dL — ABNORMAL HIGH (ref 65–99)
Glucose-Capillary: 168 mg/dL — ABNORMAL HIGH (ref 65–99)
Glucose-Capillary: 249 mg/dL — ABNORMAL HIGH (ref 65–99)

## 2015-07-07 LAB — HEMOGLOBIN A1C
Hgb A1c MFr Bld: 11.9 % — ABNORMAL HIGH (ref 4.8–5.6)
Mean Plasma Glucose: 295 mg/dL

## 2015-07-07 MED ORDER — SODIUM CHLORIDE 0.9 % IV SOLN
INTRAVENOUS | Status: DC
  Administered 2015-07-07: 06:00:00 via INTRAVENOUS

## 2015-07-07 MED ORDER — SODIUM CHLORIDE 0.9 % IV BOLUS (SEPSIS)
250.0000 mL | Freq: Once | INTRAVENOUS | Status: AC
  Administered 2015-07-07: 250 mL via INTRAVENOUS

## 2015-07-07 NOTE — Progress Notes (Signed)
Manual blood pressure 82/58 after 250 mL fluid bolus administered.  Automatic blood pressure 88/54 (63).  Paged MD Wynonia Lawman to inform, new orders received.  Vicie Mutters, RN

## 2015-07-07 NOTE — Care Management Note (Signed)
Case Management Note  Patient Details  Name: Amy Snow MRN: [id number] Date of Birth: 12-Jul-1966  Subjective/Objective:    Adm w mi                Action/Plan:pt lives w fam, no pcp at present. States no ins. Works but no ins at present.   Expected Discharge Date:                 Expected Discharge Plan:  Home/Self Care  In-House Referral:     Discharge planning Services  CM Consult, Medication Assistance, [facility]  Post Acute Care Choice:    Choice offered to:     DME Arranged:    DME Agency:     HH Arranged:    HH Agency:     Status of Service:     Medicare Important Message Given:    Date Medicare IM Given:    Medicare IM give by:    Date Additional Medicare IM Given:    Additional Medicare Important Message give by:     If discussed at [facility] of Stay Meetings, dates discussed:    Additional Comments:ur review done. Gave pt 30day free brilinta card. Placed pt assist form on shadow chart for md to sign. Pt aware she will need to send in form w proof of income. Gave her inform on [facility] and [facility] and explained [facility].  Lacretia Leigh, RN 07/07/2015, 9:50 AM

## 2015-07-07 NOTE — Progress Notes (Signed)
CARDIAC REHAB PHASE I   PRE:  Rate/Rhythm: 87 SR  BP:  Supine:   Sitting: 120/75  Standing:    SaO2:   MODE:  Ambulation: 350 ft   POST:  Rate/Rhythm: 103 ST  BP:  Supine:   Sitting: 102/65  Standing:    SaO2:  1045-1203 Pt walked 350 ft on RA with hand held asst with steady gait. No CP or dizziness. Discussed with pt watching carbs, sodium and fat/cholesterol. Pt stated due to finances, she does eat canned foods and less expensive foods. Discussed healthy choices. Reviewed importance of brilinta with stent. Reviewed NTG use and risk factors and MI restrictions. To recliner after walk. Pt likes salt on her foods. Gave low sodium handouts and discussed 2000 mg restriction. Is limited with exercise due to fibromyalgia pt states. Has good days and bad days.    Graylon Good, RN BSN  07/07/2015 11:59 AM

## 2015-07-07 NOTE — Progress Notes (Signed)
Paged by nurse, Patient's BP of 87/56. Will hold AM dose of Coreg, also hold for SBP less than 90. Will discuss with MD.

## 2015-07-07 NOTE — Progress Notes (Signed)
Inpatient Diabetes Program Recommendations  AACE/ADA: New Consensus Statement on Inpatient Glycemic Control (2013)  Target Ranges:  Prepandial:   less than 140 mg/dL      Peak postprandial:   less than 180 mg/dL (1-2 hours)      Critically ill patients:  140 - 180 mg/dL   Elevated A1C and started on insulin this hospitalization.  Currently ordered Levemir 45 units BID.  If new to insulin a weight based dose is recommended.  Levemir 20 units once a day is a recommended starting dose for basal insulin.   Will follow for education needs and order bedside education as appropriate.  Case management has been consulted regarding medication assistance. Thank you  Raoul Pitch BSN, RN,CDE Inpatient Diabetes Coordinator [phone] (team pager)

## 2015-07-07 NOTE — Progress Notes (Signed)
   SUBJECTIVE:  No CP. Some hypotension.  OBJECTIVE:   Vitals:   Filed Vitals:   07/07/15 0418 07/07/15 0600 07/07/15 0722 07/07/15 0920  BP: 84/62 87/43 94/53  87/56  Pulse:   88   Temp: 97.6 F (36.4 C)  98.1 F (36.7 C)   TempSrc: Axillary  Oral   Resp: 15 25 18 24   Height: 4\' 11"  (1.499 m)     Weight: 148 lb 6.4 oz (67.314 kg)     SpO2: 98% 100% 100% 100%   I&O's:   Intake/Output Summary (Last 24 hours) at 07/07/15 1033 Last data filed at 07/07/15 8527  Gross per 24 hour  Intake 1893.5 ml  Output    950 ml  Net  943.5 ml   TELEMETRY: Reviewed telemetry pt in NSR:     PHYSICAL EXAM General: Well developed, well nourished, in no acute distress Head:   Normal cephalic and atramatic  Lungs:   Clear bilaterally to auscultation. Heart:   HRRR S1 S2  No JVD.   Abdomen: abdomen soft and non-tender Msk:  Back normal,  Normal strength and tone for age. Extremities:   No edema.   Neuro: Alert and oriented. Psych:  Normal affect, responds appropriately Skin: No rash   LABS: Basic Metabolic Panel:  Recent Labs  07/06/15 0418 07/07/15 0233  NA 132* 135  K 4.1 3.5  CL 96* 99*  CO2 25 25  GLUCOSE 268* 182*  BUN 12 11  CREATININE 0.45 0.57  CALCIUM 9.0 8.9  MG 1.9  --    Liver Function Tests:  Recent Labs  07/06/15 0418  AST 59*  ALT 12*  ALKPHOS 85  BILITOT 0.4  PROT 6.4*  ALBUMIN 3.6    Recent Labs  07/06/15 1030  LIPASE 17*  AMYLASE 28   CBC:  Recent Labs  07/06/15 0418  WBC 7.8  HGB 13.1  HCT 38.9  MCV 85.9  PLT 207   Cardiac Enzymes:  Recent Labs  07/05/15 2259 07/06/15 0418 07/06/15 1030  TROPONINI 6.72* 18.31* 17.78*   BNP: Invalid input(s): POCBNP D-Dimer: No results for input(s): DDIMER in the last 72 hours. Hemoglobin A1C: No results for input(s): HGBA1C in the last 72 hours. Fasting Lipid Panel:  Recent Labs  07/06/15 0418  CHOL 423*  HDL NOT REPORTED DUE TO HIGH TRIGLYCERIDES  LDLCALC UNABLE TO CALCULATE IF  TRIGLYCERIDE OVER 400 mg/dL  TRIG 1303*  CHOLHDL NOT REPORTED DUE TO HIGH TRIGLYCERIDES   Thyroid Function Tests: No results for input(s): TSH, T4TOTAL, T3FREE, THYROIDAB in the last 72 hours.  Invalid input(s): FREET3 Anemia Panel: No results for input(s): VITAMINB12, FOLATE, FERRITIN, TIBC, IRON, RETICCTPCT in the last 72 hours. Coag Panel:   No results found for: INR, PROTIME  RADIOLOGY: No results found.    ASSESSMENT: s/p inferolateral STEMI; multiple RF for CAD  PLAN:  Continue DAPT for at least a year.  I personally reviewed her cath films.  SHe has severe LVdysfunction in what looks like a Takatsubo cardiomyopathy pattern.  BP too low for CHF meds this AM.  COntinue to watch on monitor.  Transfer to stepdown today.   Needs aggressive lipid lowering therapy.  Aggressive DM management.   Explained results to patient and family in the room.  Will discuss withDr. Gwenlyn Found.  I think it may be better to wait for some LV EF improvement before pursuing PCI of the LAD.  Jettie Booze, MD  07/07/2015  10:33 AM

## 2015-07-07 NOTE — Progress Notes (Signed)
Patient's blood pressure has been 70's/50's (MAP 59-62).  Patient asymptomatic with no complaints.  Paged MD Wynonia Lawman to inform, new orders received and MD requested blood pressure be verified manually post fluid bolus. Vicie Mutters, RN

## 2015-07-08 LAB — BASIC METABOLIC PANEL
Anion gap: 10 (ref 5–15)
BUN: 16 mg/dL (ref 6–20)
CO2: 25 mmol/L (ref 22–32)
Calcium: 9 mg/dL (ref 8.9–10.3)
Chloride: 104 mmol/L (ref 101–111)
Creatinine, Ser: 0.56 mg/dL (ref 0.44–1.00)
GFR calc Af Amer: >60 mL/min (ref >60)
GFR calc non Af Amer: >60 mL/min (ref >60)
Glucose, Bld: 146 mg/dL — ABNORMAL HIGH (ref 65–99)
Potassium: 3.6 mmol/L (ref 3.5–5.1)
Sodium: 139 mmol/L (ref 135–145)

## 2015-07-08 LAB — GLUCOSE, CAPILLARY
Glucose-Capillary: 96 mg/dL (ref 65–99)
Glucose-Capillary: 117 mg/dL — ABNORMAL HIGH (ref 65–99)
Glucose-Capillary: 217 mg/dL — ABNORMAL HIGH (ref 65–99)
Glucose-Capillary: 205 mg/dL — ABNORMAL HIGH (ref 65–99)

## 2015-07-08 LAB — POCT ACTIVATED CLOTTING TIME: Activated Clotting Time: 331 seconds

## 2015-07-08 MED ORDER — LIVING WELL WITH DIABETES BOOK
Freq: Once | Status: AC
  Administered 2015-07-08: 1
  Filled 2015-07-08: qty 1

## 2015-07-08 MED ORDER — PANTOPRAZOLE SODIUM 40 MG PO TBEC
40.0000 mg | DELAYED_RELEASE_TABLET | Freq: Every day | ORAL | Status: DC
Start: 2015-07-08 — End: 2015-07-08

## 2015-07-08 MED ORDER — INSULIN STARTER KIT- SYRINGES (ENGLISH)
1.0000 | Freq: Once | Status: DC
  Filled 2015-07-08: qty 1

## 2015-07-08 NOTE — Progress Notes (Signed)
SUBJECTIVE:  No chest pain..  Did well with walk.  BP stabilizing , less low readings.  OBJECTIVE:   Vitals:   Filed Vitals:   07/08/15 0007 07/08/15 0400 07/08/15 0424 07/08/15 0740  BP: 82/48 100/69  117/73  Pulse:    87  Temp:   98.3 F (36.8 C) 97.7 F (36.5 C)  TempSrc:   Oral Oral  Resp: 22 25  20   Height:      Weight:   151 lb 14.4 oz (68.9 kg)   SpO2: 98% 97%  100%   I&O's:   Intake/Output Summary (Last 24 hours) at 07/08/15 1000 Last data filed at 07/08/15 0740  Gross per 24 hour  Intake   1033 ml  Output    300 ml  Net    733 ml   TELEMETRY: Reviewed telemetry pt in NSR:     PHYSICAL EXAM General: Well developed, well nourished, in no acute distress Head:   Normal cephalic and atramatic  Lungs:   Clear bilaterally to auscultation. Heart:   HRRR S1 S2  No JVD.   Abdomen: abdomen soft and non-tender Msk:  Back normal,  Normal strength and tone for age. Extremities:   No edema.  2+ right radial pulse Neuro: Alert and oriented. Psych:  Normal affect, responds appropriately Skin: No rash   LABS: Basic Metabolic Panel:  Recent Labs  07/06/15 0418 07/07/15 0233 07/08/15 0320  NA 132* 135 139  K 4.1 3.5 3.6  CL 96* 99* 104  CO2 25 25 25   GLUCOSE 268* 182* 146*  BUN 12 11 16   CREATININE 0.45 0.57 0.56  CALCIUM 9.0 8.9 9.0  MG 1.9  --   --    Liver Function Tests:  Recent Labs  07/06/15 0418  AST 59*  ALT 12*  ALKPHOS 85  BILITOT 0.4  PROT 6.4*  ALBUMIN 3.6    Recent Labs  07/06/15 1030  LIPASE 17*  AMYLASE 28   CBC:  Recent Labs  07/05/15 2127 07/06/15 0418  WBC  --  7.8  HGB 12.9 13.1  HCT 38.0 38.9  MCV  --  85.9  PLT  --  207   Cardiac Enzymes:  Recent Labs  07/05/15 2259 07/06/15 0418 07/06/15 1030  TROPONINI 6.72* 18.31* 17.78*   BNP: Invalid input(s): POCBNP D-Dimer: No results for input(s): DDIMER in the last 72 hours. Hemoglobin A1C:  Recent Labs  07/06/15 0418  HGBA1C 11.9*   Fasting Lipid  Panel:  Recent Labs  07/06/15 0418  CHOL 423*  HDL NOT REPORTED DUE TO HIGH TRIGLYCERIDES  LDLCALC UNABLE TO CALCULATE IF TRIGLYCERIDE OVER 400 mg/dL  TRIG 1303*  CHOLHDL NOT REPORTED DUE TO HIGH TRIGLYCERIDES   Thyroid Function Tests: No results for input(s): TSH, T4TOTAL, T3FREE, THYROIDAB in the last 72 hours.  Invalid input(s): FREET3 Anemia Panel: No results for input(s): VITAMINB12, FOLATE, FERRITIN, TIBC, IRON, RETICCTPCT in the last 72 hours. Coag Panel:   No results found for: INR, PROTIME  RADIOLOGY: No results found.    ASSESSMENT:   PLAN:  Continue DAPT for at least a year. I personally reviewed her cath films. SHe has severe LVdysfunction in what looks like a Takatsubo cardiomyopathy pattern. BP better.  Stop low dose losartan and aldactone due to low BP. COntinue only carvedilol.  May increase to 6.25 BID. COntinue to watch on monitor. Transfer to tele today.  Trying to simplify med regimen.  If BP increases as outpatient, could restart ARB.  Needs aggressive  lipid lowering therapy.  Aggressive DM management.   Explained results to patient and family in the room. Discussed with Dr. Gwenlyn Found.He agrees it may be better to wait for some LV EF improvement before pursuing PCI of the LAD.    Anticipate d/c tomorrow with f/u with Dr. Gwenlyn Found.  Jettie Booze, MD  07/08/2015  10:00 AM

## 2015-07-08 NOTE — Progress Notes (Signed)
CARDIAC REHAB PHASE I   PRE:  Rate/Rhythm: 86 SR  BP:  Supine:   Sitting:   Standing: 102/79   SaO2:   MODE:  Ambulation: 700 ft   POST:  Rate/Rhythm: 104 ST  BP:  Supine:   Sitting: 112/61  Standing:    SaO2:  1115-1140 Pt walked 700 ft with steady gait. Stopped once to rest. Encouraged pt to do light walking at home and notify cardiologist of any CP. Will discuss CRP 2 when pt returns for staged PCI. Wanted to bathe so I got towels and clothes for her. Will follow up tomorrow.   Graylon Good, RN BSN  07/08/2015 11:37 AM   '

## 2015-07-08 NOTE — Progress Notes (Signed)
Inpatient Diabetes Program Recommendations  AACE/ADA: New Consensus Statement on Inpatient Glycemic Control (2013)  Target Ranges:  Prepandial:   less than 140 mg/dL      Peak postprandial:   less than 180 mg/dL (1-2 hours)      Critically ill patients:  140 - 180 mg/dL   This coordinator met with patient and significant other to discuss diabetes management at home. Pt reports she was previously on Levemir pens but then lost insurance and could no longer afford.  Recommend discharging patient on [place] 70/30 for affordability.  Even if patient can get 30 day assistance she will still need to buy insulin out-of-pocket after 30 days.  Discussed onset and duration of premix insulin, monitoring CBGs and consistent diet.  Also discussed hypoglycemia and how to treat (15:15 rule).  Pt was appreciative of visit and willing to make necessary lifestyle modifications for glycemic control at home.  DM videos have been ordered for pt to view, and Living Well with Diabetes patient education workbook.  Diabetes Meal Planning Guide was given to patient.  No further questions/concerns at the end of our visit. Thank you  Raoul Pitch BSN, RN,CDE Inpatient Diabetes Coordinator [phone] (team pager)

## 2015-07-08 NOTE — Progress Notes (Signed)
Transferred to [facility] room 29 by wheelchair, stable, report given to RN,  Belongings with pt.

## 2015-07-09 DIAGNOSIS — I2119 ST elevation (STEMI) myocardial infarction involving other coronary artery of inferior wall: Secondary | ICD-10-CM

## 2015-07-09 DIAGNOSIS — R931 Abnormal findings on diagnostic imaging of heart and coronary circulation: Secondary | ICD-10-CM

## 2015-07-09 LAB — BASIC METABOLIC PANEL
Anion gap: 9 (ref 5–15)
BUN: 20 mg/dL (ref 6–20)
CO2: 23 mmol/L (ref 22–32)
Calcium: 8.4 mg/dL — ABNORMAL LOW (ref 8.9–10.3)
Chloride: 102 mmol/L (ref 101–111)
Creatinine, Ser: 0.6 mg/dL (ref 0.44–1.00)
GFR calc Af Amer: >60 mL/min (ref >60)
GFR calc non Af Amer: >60 mL/min (ref >60)
Glucose, Bld: 161 mg/dL — ABNORMAL HIGH (ref 65–99)
Potassium: 3.6 mmol/L (ref 3.5–5.1)
Sodium: 134 mmol/L — ABNORMAL LOW (ref 135–145)

## 2015-07-09 LAB — GLUCOSE, CAPILLARY
Glucose-Capillary: 157 mg/dL — ABNORMAL HIGH (ref 65–99)
Glucose-Capillary: 180 mg/dL — ABNORMAL HIGH (ref 65–99)

## 2015-07-09 MED ORDER — TICAGRELOR 90 MG PO TABS: 90.0000 mg | ORAL_TABLET | Freq: Two times a day (BID) | ORAL | Status: DC

## 2015-07-09 MED ORDER — ATORVASTATIN CALCIUM 80 MG PO TABS: 80.0000 mg | ORAL_TABLET | Freq: Every day | ORAL | Status: DC

## 2015-07-09 MED ORDER — ASPIRIN 81 MG PO CHEW: 81.0000 mg | CHEWABLE_TABLET | Freq: Every day | ORAL | Status: DC

## 2015-07-09 MED ORDER — CARVEDILOL 3.125 MG PO TABS: 3.1250 mg | ORAL_TABLET | Freq: Two times a day (BID) | ORAL | Status: DC

## 2015-07-09 MED ORDER — FENOFIBRATE 160 MG PO TABS: 160.0000 mg | ORAL_TABLET | Freq: Every day | ORAL | Status: DC

## 2015-07-09 MED ORDER — OMEGA-3-ACID ETHYL ESTERS 1 G PO CAPS: 1.0000 g | ORAL_CAPSULE | Freq: Two times a day (BID) | ORAL | Status: DC

## 2015-07-09 MED ORDER — INSULIN ASPART 100 UNIT/ML ~~LOC~~ SOLN: 0.0000 [IU] | Freq: Three times a day (TID) | SUBCUTANEOUS | Status: DC

## 2015-07-09 MED ORDER — NITROGLYCERIN 0.4 MG SL SUBL: 0.4000 mg | SUBLINGUAL_TABLET | SUBLINGUAL | Status: DC | PRN

## 2015-07-09 MED ORDER — INSULIN NPH ISOPHANE & REGULAR (70-30) 100 UNIT/ML ~~LOC~~ SUSP: 35.0000 [IU] | Freq: Two times a day (BID) | SUBCUTANEOUS | Status: DC

## 2015-07-09 NOTE — Care Management Note (Signed)
Case Management Note Note started by Elissa Hefty RNCM  Patient Details  Name: Amy Snow MRN: [id number] Date of Birth: August 30, 1966  Subjective/Objective:    Adm w mi                Action/Plan:pt lives w fam, no pcp at present. States no ins. Works but no ins at present.   Expected Discharge Date:    07/09/15             Expected Discharge Plan:  Home/Self Care  In-House Referral:     Discharge planning Services  CM Consult, Medication Assistance, [facility]  Post Acute Care Choice:    Choice offered to:     DME Arranged:    DME Agency:     HH Arranged:    HH Agency:     Status of Service:  Completed, signed off  Medicare Important Message Given:    Date Medicare IM Given:    Medicare IM give by:    Date Additional Medicare IM Given:    Additional Medicare Important Message give by:     If discussed at [place] of Stay Meetings, dates discussed:    07/09/15- f/u done with pt - d/c for today- pt reports that she has info on clinics and has 30 day free card- pt assist form given to pt to f/u with on her follow up appointment- pt to get needed paperwork together and take with her to appointment. No other CM needs noted.   Additional Comments:ur review done. Gave pt 30day free brilinta card. Placed pt assist form on shadow chart for md to sign. Pt aware she will need to send in form w proof of income. Gave her inform on [facility] and [facility] and explained  and [facility] clinic.  Amy Patricia, RN 07/09/2015, 2:29 PM

## 2015-07-09 NOTE — Progress Notes (Signed)
Patient Profile: 49 yo F w a h/o DM, HTN, HLD, & Fibromyalgia admitted with inferior STEMI.    Subjective: Doing well. Denies any recurrent CP or dyspnea. Ambulating w/o difficulty.   Objective: Vital signs in last 24 hours: Temp:  [97.7 F (36.5 C)-98.3 F (36.8 C)] 98.3 F (36.8 C) (08/17 0524) Pulse Rate:  [77-85] 82 (08/17 0524) Resp:  [19-23] 19 (08/17 0524) BP: (90-111)/(56-74) 95/59 mmHg (08/17 0524) SpO2:  [96 %-100 %] 98 % (08/17 0524) Weight:  [151 lb 6.4 oz (68.675 kg)-152 lb (68.947 kg)] 151 lb 6.4 oz (68.675 kg) (08/17 0524) Last BM Date: 07/05/15  Intake/Output from previous day: 08/16 0701 - 08/17 0700 In: 710 [P.O.:710] Out: -  Intake/Output this shift: Total I/O In: 240 [P.O.:240] Out: -   Medications Current Facility-Administered Medications  Medication Dose Route Frequency Provider Last Rate Last Dose  . 0.9 %  sodium chloride infusion  250 mL Intravenous PRN Runell Gess, MD      . acetaminophen (TYLENOL) tablet 650 mg  650 mg Oral Q6H PRN Julaine Hua, MD   650 mg at 07/07/15 1336   Or  . acetaminophen (TYLENOL) suppository 650 mg  650 mg Rectal Q6H PRN Julaine Hua, MD      . aspirin chewable tablet 81 mg  81 mg Oral Daily Runell Gess, MD   81 mg at 07/08/15 0910  . atorvastatin (LIPITOR) tablet 80 mg  80 mg Oral q1800 Julaine Hua, MD   80 mg at 07/08/15 1646  . carvedilol (COREG) tablet 3.125 mg  3.125 mg Oral BID WC Bhavinkumar Bhagat, PA   3.125 mg at 07/08/15 1646  . fenofibrate tablet 160 mg  160 mg Oral Daily Dolores Patty, MD   160 mg at 07/08/15 0911  . insulin aspart (novoLOG) injection 0-15 Units  0-15 Units Subcutaneous TID WC Dolores Patty, MD   3 Units at 07/09/15 [phone]  . insulin aspart (novoLOG) injection 0-5 Units  0-5 Units Subcutaneous QHS Dolores Patty, MD   2 Units at 07/08/15 2217  . insulin detemir (LEVEMIR) injection 45 Units  45 Units Subcutaneous BID Runell Gess, MD   45 Units at  07/08/15 2216  . insulin starter kit- syringes (English) 1 kit  1 kit Other Once Runell Gess, MD   1 kit at 07/08/15 1000  . morphine 2 MG/ML injection 2 mg  2 mg Intravenous Q1H PRN Runell Gess, MD   2 mg at 07/06/15 0402  . omega-3 acid ethyl esters (LOVAZA) capsule 1 g  1 g Oral BID Dolores Patty, MD   1 g at 07/08/15 2215  . ondansetron (ZOFRAN) injection 4 mg  4 mg Intravenous Q6H PRN Runell Gess, MD   4 mg at 07/06/15 2212  . pantoprazole (PROTONIX) EC tablet 40 mg  40 mg Oral Daily Julaine Hua, MD   40 mg at 07/08/15 0910  . pneumococcal 23 valent vaccine (PNU-IMMUNE) injection 0.5 mL  0.5 mL Intramuscular Tomorrow-1000 Runell Gess, MD   0.5 mL at 07/06/15 1000  . sodium chloride 0.9 % injection 3 mL  3 mL Intravenous Q12H Runell Gess, MD   3 mL at 07/08/15 2217  . sodium chloride 0.9 % injection 3 mL  3 mL Intravenous PRN Runell Gess, MD      . sodium chloride 0.9 % injection 3 mL  3 mL Intravenous Q12H Julaine Hua, MD  3 mL at 07/08/15 2217  . sucralfate (CARAFATE) 1 GM/10ML suspension 1 g  1 g Oral TID WC & HS Jolaine Artist, MD   1 g at 07/08/15 2215  . ticagrelor (BRILINTA) tablet 90 mg  90 mg Oral BID Lorretta Harp, MD   90 mg at 07/08/15 2215  . topiramate (TOPAMAX) tablet 25 mg  25 mg Oral Daily Lorretta Harp, MD   25 mg at 07/08/15 0914  . traZODone (DESYREL) tablet 150 mg  150 mg Oral QHS Lorretta Harp, MD   150 mg at 07/09/15 0108    PE: General appearance: alert, cooperative and no distress Neck: no carotid bruit and no JVD Lungs: clear to auscultation bilaterally Heart: regular rate and rhythm, S1, S2 normal, no murmur, click, rub or gallop Extremities: no LEE Pulses: 2+ and symmetric Skin: warm and dry Neurologic: Grossly normal  Lab Results:  No results for input(s): WBC, HGB, HCT, PLT in the last 72 hours. BMET  Recent Labs  07/07/15 0233 07/08/15 0320 07/09/15 0442  NA 135 139 134*  K 3.5 3.6 3.6    CL 99* 104 102  CO2 $Re'25 25 23  'BUe$ GLUCOSE 182* 146* 161*  BUN $Re'11 16 20  'Hxu$ CREATININE 0.57 0.56 0.60  CALCIUM 8.9 9.0 8.4*     Assessment/Plan  Active Problems:   ST elevation myocardial infarction (STEMI) of inferior wall   Acute ST elevation myocardial infarction (STEMI) involving other coronary artery of anterior wall   Diabetes mellitus   Acute heart failure   Essential hypertension   Hyperlipidemia   Fibromyalgia   1. CAD/STEMI:  Recent cath showed three-vessel disease with severe LV dysfunction. Her infarct vessel was the PDA which was stented with a drug-eluting stent. She has residual disease in the LAD, diagonal branch and circumflex marginal branch. It appears that her LV dysfunction is out of proportion to the degree of CAD. No further CP. Ambulating w/o difficulty. Continue DAPT with ASA + Brilinta, Coreg and high dose Lipitor.  2. Cardiomyopathy: EF 35-40% on echo. It appears that her LV dysfunction is out of proportion to the degree of CAD.  Looks like a Takatsubo cardiomyopathy pattern. However, she does have residual LAD disease 80% mid to distal lesion. Plan is to recheck LVF as an OP in several weeks. If no improvement in EF, will consider LAD PCI. For now, continue medical therapy. Continue BB. Losartan and spironolactone on hold for soft BP.   3. HLD: total cholesterol 423. Triglycerides 1303. LDL could not be calculated. Continue high dose statin therapy with Lipitor. Recheck FLP and LFTs in 6-8 weeks.   4. DM: poorly controlled. Hgb A1c 11.9. On insulin. Will need close f/u with PCP.    LOS: 4 days    Brittainy M. Ladoris Gene 07/09/2015 10:22 AM  I have examined the patient and reviewed assessment and plan and discussed with patient.  Agree with above as stated.  Plan for repeat echo in 4 weeks to see if anterior wall motion has improved.  Out of work for 2 weeks until seen by Dr. Gwenlyn Found in the office for [facility] visit.  Would determine her return to work status at  that appt.  She potentially could need light duty.  Plan LAD diagonal PCI based on echo results. Coreg only.  No ACE or spironolactone due to low BP. OK for discharge today.    Flavia Bruss S.

## 2015-07-09 NOTE — Progress Notes (Signed)
Inpatient Diabetes Program Recommendations  AACE/ADA: New Consensus Statement on Inpatient Glycemic Control (2013)  Target Ranges:  Prepandial:   less than 140 mg/dL      Peak postprandial:   less than 180 mg/dL (1-2 hours)      Critically ill patients:  140 - 180 mg/dL   Spoke With [place] with [facility] regarding d/c insulin. Pt 68 kg, but had been on levemir 45 units bid, which is high dose basal. To use ReliOn Novolin 70/30 and recommended 35 units bid for total of 70 units insulin per day. Thank you Rosita Kea, RN, MSN, CDE  Diabetes Inpatient Program Office: [phone] Pager: [phone] 8:00 am to 5:00 pm

## 2015-07-09 NOTE — Discharge Summary (Signed)
Physician Discharge Summary  Patient ID: Amy Snow MRN: [id number] DOB/AGE: Dec 11, 1965 49 y.o.   Primary Cardiologist: Dr. Gwenlyn Snow  Admit date: 07/05/2015 Discharge date: 07/09/2015  Admission Diagnoses: STEMI  Discharge Diagnoses:  Active Problems:   ST elevation myocardial infarction (STEMI) of inferior wall   Acute ST elevation myocardial infarction (STEMI) involving other coronary artery of anterior wall   Diabetes mellitus   Acute heart failure   Essential hypertension   Hyperlipidemia   Fibromyalgia   Discharged Condition: stable  Hospital Course: Amy Snow is a 49 yo F w a h/o poorly controlled DM type 2 (Hgb A1c of 11.9) HTN, HLD (triglycerides 1300), & Fibromyalgia who was transferred from [facility] on 07/05/15 after presenting with 4 hours of nearly constant chest pain that radiated into her back & arms bilaterally associated with dyspnea. At the OSH, she was noted to have 1 mm inferior ST elevation. Prior to transfer, she was she was treated with ASA 324, Heparin 5000 IV, Ativan 1 mgIV, Morphine 4 mg IV, Nitro-Bid 2% 1", & Zofran 4 mg IV. With direct transfer to the cardiac cath lab, her culprit vessel was identified as the PDA for which she received a 2.25x16 Synergy DES. She was also noted to have residual disease in her LAD with an 80% mid to distal lesion, 90% diagonal and 90% OM branch of the left circumflex, for which medical therapy was elected. EF by cath was estimated at 20%. She left the cath lab in stable condition and was placed on DAPT with ASA + Brilinta. She was also placed on high dose statin therapy, Coreg, losartan and spironolactone. She had no further CP. Her LVF was reassessed by 2D echo, post intervention of PDA and was improved at 35-40%. There also appeared to be a pattern consistent with Takatsubo. Since she had had no recurrent CP, decision was made to wait and reassess LVF by repeat 2D echo in 4 weeks to see if any improvement in  systolic function. If no improvement, PCI of the LAD could be considered. Decision was made to continue medical therapy. However, due to borderline hypotension, her losartan and spironolactone were discontinued. She was continued on Coreg and BP remained stable. She had no difficulty ambulating with cardiac rehab. She was last seen and examined by Dr. Irish Lack who determined she was stable for discharge home. Post hospital TOC f/u has been arranged in [place] with Amy Husk, PA-C, on 07/23/15, however she wishes to continue long term f/u with Dr. Gwenlyn Snow in [place]. This has been arranged for 08/13/15. She will get a repeat 2D echo, in 4 weeks on 08/11/15, prior to her appointment with Dr. Gwenlyn Snow. She will also need a repeat FLP and HFTs in 6-8 weeks. She has been instructed to f/u with her PCP regarding management of her diabetes (Hgb A1C 11.9). Diabetes coordinator recommended discharging on Novolin 70/30, 35 units BID WC. She has been instructed to remain out of work for at least 2 weeks. Will reassess at her TOC f/u. Could potentially return to light duty if stable.    Consults: None  Significant Diagnostic Studies:  LHC 07/05/15 Coronary Findings    Dominance: Right   Left Anterior Descending   . Mid LAD to Dist LAD lesion, 80% stenosed.   . Second Diagonal Branch   . Ost 2nd Diag to 2nd Diag lesion, 90% stenosed.   . 2nd Diag lesion, 90% stenosed.     Left Circumflex   . First Obtuse Marginal Branch   .  1st Mrg lesion, 90% stenosed.     Right Coronary Artery   . Right Posterior Descending Artery   . RPDA lesion, 100% stenosed.   Marland Kitchen PCI: An unspecified stent was placed.  . There is no residual stenosis post intervention.     . First Right Posterolateral   . 1st RPLB-1 lesion, 90% stenosed.   . 1st RPLB-2 lesion, 90% stenosed.     Left Heart    Left Ventricle The left ventricle is enlarged. There is severe left ventricular systolic dysfunction. The left ventricular ejection  fraction is less than 25% by visual estimate. There are wall motion abnormalities in the left ventricle.    2D echo 07/06/15  Study Conclusions  - Left ventricle: The cavity size was normal. Wall thickness was normal. Systolic function was moderately reduced. The estimated ejection fraction was in the range of 35% to 40%. Akinesis of the apicalanteroseptal, inferior, and apical myocardium. Doppler parameters are consistent with abnormal left ventricular relaxation (grade 1 diastolic dysfunction).   Treatments:  See Hospital Course  Discharge Exam: Blood pressure 95/59, pulse 82, temperature 98.3 F (36.8 C), temperature source Oral, resp. rate 19, height 4\' 11"  (1.499 m), weight 151 lb 6.4 oz (68.675 kg), SpO2 98 %.   Disposition: Final discharge disposition not confirmed      Discharge Instructions    Diet - low sodium heart healthy    Complete by:  As directed      Increase activity slowly    Complete by:  As directed             Medication List    TAKE these medications        acetaminophen 500 MG tablet  Commonly known as:  TYLENOL  Take 1,000-2,000 mg by mouth 3 (three) times daily as needed for moderate pain.     aspirin 81 MG chewable tablet  Chew 1 tablet (81 mg total) by mouth daily.     atorvastatin 80 MG tablet  Commonly known as:  LIPITOR  Take 1 tablet (80 mg total) by mouth daily at 6 PM.     carvedilol 3.125 MG tablet  Commonly known as:  COREG  Take 1 tablet (3.125 mg total) by mouth 2 (two) times daily with a meal.     fenofibrate 160 MG tablet  Take 1 tablet (160 mg total) by mouth daily.     insulin NPH-regular Human (70-30) 100 UNIT/ML injection  Commonly known as:  NOVOLIN 70/30 RELION  Inject 35 Units into the skin 2 (two) times daily with a meal.     Magnesium 250 MG Tabs  Take 250 mg by mouth daily.     nitroGLYCERIN 0.4 MG SL tablet  Commonly known as:  NITROSTAT  Place 1 tablet (0.4 mg total) under the tongue every 5  (five) minutes as needed for chest pain.     omega-3 acid ethyl esters 1 G capsule  Commonly known as:  LOVAZA  Take 1 capsule (1 g total) by mouth 2 (two) times daily.     omeprazole 20 MG capsule  Commonly known as:  PRILOSEC  Take 40 mg by mouth 2 (two) times daily before a meal.     ticagrelor 90 MG Tabs tablet  Commonly known as:  BRILINTA  Take 1 tablet (90 mg total) by mouth 2 (two) times daily.     ticagrelor 90 MG Tabs tablet  Commonly known as:  BRILINTA  Take 1 tablet (90 mg total) by mouth  2 (two) times daily.     VITAMIN B-1 PO  Take 1 tablet by mouth daily.       Follow-up Information    Follow up with Ermalinda Barrios, PA-C On 07/23/2015.   Specialty:  Cardiology   Why:  11:20 (cardiology f/u)   Contact information:   [place] [address] [phone]       Follow up with Quay Burow, MD On 08/13/2015.   Specialties:  Cardiology, Radiology   Why:  2:00 pm    Contact information:   [address] [place] [address] [phone]       Follow up with [facility] On 08/11/2015.   Why:  10:30 am (for ultrasound of heart)    Contact information:   [place] [phone]       TIME SPENT ON DISCHARGE, INCLUDING PHYSICIAN TIME: > 30 MINUTES   Signed: Lyda Jester 07/09/2015, 12:29 PM  I have examined the patient and reviewed assessment and plan and discussed with patient.  Agree with above as stated.  Low dose beta blocker only. No ACE-I.  Out of work until seen in the office.  Repeat revascularization by Dr. Gwenlyn Snow to be determined by LV function improvement.  Hans Rusher S.

## 2015-07-09 NOTE — Progress Notes (Signed)
Dc  Preparations made, explained to pt and family.

## 2015-07-09 NOTE — Progress Notes (Signed)
CARDIAC REHAB PHASE I   PRE:  Rate/Rhythm: 86 SR    BP: sitting 110/70    SaO2:   MODE:  Ambulation: 550 ft   POST:  Rate/Rhythm: 95 SR    BP: sitting 106/80     SaO2:   Tolerated well with slow pace. Has been walking independently. Discussed HF, daily wts, low sodium diet, counting carbs, and light walking at home. Voiced understanding although admits she is overwhelmed. Encouraged pt to read her information sheets thoroughly. Pt declined watching videos. Reminded pt to take NTG if angina.  [phone]   Amy Snow [place] CES, ACSM 07/09/2015 11:23 AM

## 2015-07-10 ENCOUNTER — Telehealth: Payer: Self-pay | Admitting: Cardiovascular Disease

## 2015-07-10 NOTE — Telephone Encounter (Signed)
D/C phone call .Mar[facility] Kitchen Appt on 07/23/15 at 11:20am w/ Estella Husk in [place]   Thanks

## 2015-07-10 NOTE — Telephone Encounter (Signed)
Patient contacted regarding discharge from [facility] on 07/09/2015.  Patient understands to follow up with provider Estella Husk, PA on 07/23/2015 at 11:20am at the [facility] office. Patient understands discharge instructions? yes Patient understands medications and regiment? yes Patient understands to bring all medications to this visit? yes  She has pressure at the base of her throat off and on since 2am.  Due to financial reasons she has not gotten any of her meds filled.  Family is on their way now to pick up several of her meds including Brilinta.  States she has been very emotional since being home.  Will try NTG.  States the discomfort is not the same as what she has experienced in the past.  If it returns and not relieved with NTG instructed to go to the ER or call EMS.  Patient voiced understanding.

## 2015-07-17 ENCOUNTER — Telehealth: Payer: Self-pay | Admitting: Physician Assistant

## 2015-07-17 ENCOUNTER — Encounter: Payer: Self-pay | Admitting: Physician Assistant

## 2015-07-17 NOTE — Telephone Encounter (Signed)
pls call pt concerning weight gain

## 2015-07-17 NOTE — Telephone Encounter (Signed)
PT called about weight gain  148 for 3 days  Then crept up to155. Legs, feet or hands not swelling that she could tell- clothes not tight, but feels that she is tight  in top portion of stomach. Please advise

## 2015-07-20 NOTE — Telephone Encounter (Signed)
[facility] never seen this patient and this should have been reviewed by someone in the office on Friday. I work part time and don't check my in box daily. This patient was just in the hospital with an MI and has a low EF. Please add her onto the flex schedule on Monday ASAP. She had low BP and isn't on a diuretic.   Thanks, Ermalinda Barrios

## 2015-07-21 NOTE — Telephone Encounter (Signed)
Talked to PT this AM, she says her weight is 151. She says she has not had any swelling over the weekend. She has an appointment on Wed. I told her to call us back before Wednesday if she has any more problems.

## 2015-07-23 ENCOUNTER — Encounter: Payer: Self-pay | Admitting: Physician Assistant

## 2015-07-25 ENCOUNTER — Ambulatory Visit (INDEPENDENT_AMBULATORY_CARE_PROVIDER_SITE_OTHER): Payer: Self-pay | Admitting: Adult Health

## 2015-07-25 ENCOUNTER — Encounter: Payer: Self-pay | Admitting: Adult Health

## 2015-07-25 VITALS — BP 122/78 | HR 84 | Ht 60.0 in | Wt 155.0 lb

## 2015-07-25 DIAGNOSIS — I2510 Atherosclerotic heart disease of native coronary artery without angina pectoris: Secondary | ICD-10-CM

## 2015-07-25 DIAGNOSIS — I255 Ischemic cardiomyopathy: Secondary | ICD-10-CM

## 2015-07-25 DIAGNOSIS — Z79899 Other long term (current) drug therapy: Secondary | ICD-10-CM

## 2015-07-25 MED ORDER — NITROGLYCERIN 0.4 MG SL SUBL: 0.4000 mg | SUBLINGUAL_TABLET | SUBLINGUAL | Status: DC | PRN

## 2015-07-25 MED ORDER — CARVEDILOL 3.125 MG PO TABS: ORAL_TABLET | ORAL | Status: DC

## 2015-07-25 MED ORDER — HYDROCHLOROTHIAZIDE 12.5 MG PO CAPS: ORAL_CAPSULE | ORAL | Status: DC

## 2015-07-25 NOTE — Progress Notes (Signed)
Name: Amy Snow    DOB: 06-28-1966  Age: 49 y.o.  MR#: [id number]       PCP:  No primary care provider on file.      Insurance: Payor: MEDICAID POTENTIAL / Plan: MEDICAID POTENTIAL / Product Type: *No Product type* /   CC:   No chief complaint on file.   VS Filed Vitals:   07/25/15 1521  BP: 122/78  Pulse: 84  Height: 5' (1.524 m)  Weight: 155 lb (70.308 kg)  SpO2: 98%    Weights Current Weight  07/25/15 155 lb (70.308 kg)  07/09/15 151 lb 6.4 oz (68.675 kg)  11/09/13 156 lb (70.761 kg)    Blood Pressure  BP Readings from Last 3 Encounters:  07/25/15 122/78  07/09/15 95/59  11/09/13 120/80     Admit date:  (Not on file) Last encounter with RMR:  Visit date not found   Allergy Review of patient's allergies indicates no known allergies.  Current Outpatient Prescriptions  Medication Sig Dispense Refill  . acetaminophen (TYLENOL) 500 MG tablet Take 1,000-2,000 mg by mouth 3 (three) times daily as needed for moderate pain.    Marland Kitchen aspirin 81 MG chewable tablet Chew 1 tablet (81 mg total) by mouth daily.    Marland Kitchen atorvastatin (LIPITOR) 80 MG tablet Take 1 tablet (80 mg total) by mouth daily at 6 PM. 30 tablet 5  . carvedilol (COREG) 3.125 MG tablet Take 1 tablet (3.125 mg total) by mouth 2 (two) times daily with a meal. 60 tablet 5  . insulin NPH-regular Human (NOVOLIN 70/30 RELION) (70-30) 100 UNIT/ML injection Inject 35 Units into the skin 2 (two) times daily with a meal. 10 mL 11  . Magnesium 250 MG TABS Take 250 mg by mouth daily.    . ranitidine (ZANTAC) 150 MG tablet Take 150 mg by mouth 2 (two) times daily.    . Thiamine HCl (VITAMIN B-1 PO) Take 1 tablet by mouth daily.    . ticagrelor (BRILINTA) 90 MG TABS tablet Take 1 tablet (90 mg total) by mouth 2 (two) times daily. 60 tablet 10  . fenofibrate 160 MG tablet Take 1 tablet (160 mg total) by mouth daily. (Patient not taking: Reported on 07/25/2015) 30 tablet 5  . nitroGLYCERIN (NITROSTAT) 0.4 MG SL tablet Place 1 tablet  (0.4 mg total) under the tongue every 5 (five) minutes as needed for chest pain. (Patient not taking: Reported on 07/25/2015) 25 tablet 2  . omega-3 acid ethyl esters (LOVAZA) 1 G capsule Take 1 capsule (1 g total) by mouth 2 (two) times daily. (Patient not taking: Reported on 07/25/2015) 60 capsule 5   No current facility-administered medications for this visit.    Discontinued Meds:    Medications Discontinued During This Encounter  Medication Reason  . omeprazole (PRILOSEC) 20 MG capsule Error  . ticagrelor (BRILINTA) 90 MG TABS tablet Error    Patient Active Problem List   Diagnosis Date Noted  . Diabetes mellitus 07/06/2015  . Acute heart failure 07/06/2015  . Essential hypertension 07/06/2015  . Hyperlipidemia 07/06/2015  . Fibromyalgia 07/06/2015  . Acute ST elevation myocardial infarction (STEMI) involving other coronary artery of anterior wall 07/05/2015  . ST elevation myocardial infarction (STEMI) of inferior wall   . Depression with anxiety 10/10/2013    LABS    Component Value Date/Time   NA 134* 07/09/2015 0442   NA 139 07/08/2015 0320   NA 135 07/07/2015 0233   K 3.6 07/09/2015 0442   K  3.6 07/08/2015 0320   K 3.5 07/07/2015 0233   CL 102 07/09/2015 0442   CL 104 07/08/2015 0320   CL 99* 07/07/2015 0233   CO2 23 07/09/2015 0442   CO2 25 07/08/2015 0320   CO2 25 07/07/2015 0233   GLUCOSE 161* 07/09/2015 0442   GLUCOSE 146* 07/08/2015 0320   GLUCOSE 182* 07/07/2015 0233   BUN 20 07/09/2015 0442   BUN 16 07/08/2015 0320   BUN 11 07/07/2015 0233   CREATININE 0.60 07/09/2015 0442   CREATININE 0.56 07/08/2015 0320   CREATININE 0.57 07/07/2015 0233   CALCIUM 8.4* 07/09/2015 0442   CALCIUM 9.0 07/08/2015 0320   CALCIUM 8.9 07/07/2015 0233   GFRNONAA >60 07/09/2015 0442   GFRNONAA >60 07/08/2015 0320   GFRNONAA >60 07/07/2015 0233   GFRAA >60 07/09/2015 0442   GFRAA >60 07/08/2015 0320   GFRAA >60 07/07/2015 0233   CMP     Component Value Date/Time    NA 134* 07/09/2015 0442   K 3.6 07/09/2015 0442   CL 102 07/09/2015 0442   CO2 23 07/09/2015 0442   GLUCOSE 161* 07/09/2015 0442   BUN 20 07/09/2015 0442   CREATININE 0.60 07/09/2015 0442   CALCIUM 8.4* 07/09/2015 0442   PROT 6.4* 07/06/2015 0418   ALBUMIN 3.6 07/06/2015 0418   AST 59* 07/06/2015 0418   ALT 12* 07/06/2015 0418   ALKPHOS 85 07/06/2015 0418   BILITOT 0.4 07/06/2015 0418   GFRNONAA >60 07/09/2015 0442   GFRAA >60 07/09/2015 0442       Component Value Date/Time   WBC 7.8 07/06/2015 0418   HGB 13.1 07/06/2015 0418   HGB 12.9 07/05/2015 2127   HCT 38.9 07/06/2015 0418   HCT 38.0 07/05/2015 2127   MCV 85.9 07/06/2015 0418    Lipid Panel     Component Value Date/Time   CHOL 423* 07/06/2015 0418   TRIG 1303* 07/06/2015 0418   HDL NOT REPORTED DUE TO HIGH TRIGLYCERIDES 07/06/2015 0418   CHOLHDL NOT REPORTED DUE TO HIGH TRIGLYCERIDES 07/06/2015 0418   VLDL UNABLE TO CALCULATE IF TRIGLYCERIDE OVER 400 mg/dL 07/06/2015 0418   LDLCALC UNABLE TO CALCULATE IF TRIGLYCERIDE OVER 400 mg/dL 07/06/2015 0418    ABG    Component Value Date/Time   TCO2 21 07/05/2015 2127     No results found for: TSH BNP (last 3 results)  Recent Labs  07/06/15 0418  BNP 274.0*    ProBNP (last 3 results) No results for input(s): PROBNP in the last 8760 hours.  Cardiac Panel (last 3 results) No results for input(s): CKTOTAL, CKMB, TROPONINI, RELINDX in the last 72 hours.  Iron/TIBC/Ferritin/ %Sat No results found for: IRON, TIBC, FERRITIN, IRONPCTSAT   EKG Orders placed or performed during the hospital encounter of 07/05/15  . EKG 12-Lead immediately post procedure  . EKG 12-Lead  . EKG 12-Lead immediately post procedure  . EKG 12-Lead  . EKG 12-Lead  . EKG 12-Lead     Prior Assessment and Plan Problem List as of 07/25/2015      Cardiovascular and Mediastinum   ST elevation myocardial infarction (STEMI) of inferior wall   Acute ST elevation myocardial infarction  (STEMI) involving other coronary artery of anterior wall   Acute heart failure   Essential hypertension     Endocrine   Diabetes mellitus     Musculoskeletal and Integument   Fibromyalgia     Other   Depression with anxiety   Hyperlipidemia       Imaging:  No results found.

## 2015-07-25 NOTE — Progress Notes (Signed)
Cardiology Office Note   Date:  07/25/2015   ID:  MELANNIE METZNER, DOB Apr 02, 1966, MRN [id number]  PCP:  No primary care provider on file.  Cardiologist: Dr. Mathis Dad, NP   No chief complaint on file.     History of Present Illness: Amy Snow is a 49 y.o. female who presents for post hospital follow up for ongoing assessment and management of CAD with cardiac cath culprit vessel was identified as the PDA for which she received a 2.25x16 Synergy DES. She was also noted to have residual disease in her LAD with an 80% mid to distal lesion, 90% diagonal and 90% OM branch of the left circumflex, for which medical therapy was elected. EF by cath was estimated at 20%. There also appeared to be a pattern of Tako-Tsubo CM.    She was placed on DAPT with ASA and Brilinta, high dose statin therapy, coreg,  on discharge. She was boarderline hypotensive and therefore ARB and spironolactone were discontinued. She is to have repeat echo in 4 weeks.. She is here for post-hospital follow up. She wishes to continue with Dr. Gwenlyn Found long term. Wt on discharge 151 lbs.   She comes today fatigued. She states that she sometimes feels a cold burning in her chest that goes to her right arm. This occurs at rest. She does not have a PCP yet. She is weighing daily and medically compliant.   Past Medical History  Diagnosis Date  . Diabetes mellitus, type II   . Fibromyalgia   . Hypertension   . Hyperlipidemia   . Anxiety and depression   . GERD (gastroesophageal reflux disease)     Past Surgical History  Procedure Laterality Date  . Hernia repair    . Tonsillectomy    . Cesarean section    . Tubal ligation    . Cardiac catheterization N/A 07/05/2015    Procedure: Left Heart Cath and Coronary Angiography;  Surgeon: Lorretta Harp, MD;  Location: [facility];  Service: Cardiovascular;  Laterality: N/A;     Current Outpatient Prescriptions  Medication Sig Dispense Refill  .  acetaminophen (TYLENOL) 500 MG tablet Take 1,000-2,000 mg by mouth 3 (three) times daily as needed for moderate pain.    Marland Kitchen aspirin 81 MG chewable tablet Chew 1 tablet (81 mg total) by mouth daily.    Marland Kitchen atorvastatin (LIPITOR) 80 MG tablet Take 1 tablet (80 mg total) by mouth daily at 6 PM. 30 tablet 5  . carvedilol (COREG) 3.125 MG tablet Take 1 tablet (3.125 mg total) by mouth 2 (two) times daily with a meal. 60 tablet 5  . fenofibrate 160 MG tablet Take 1 tablet (160 mg total) by mouth daily. 30 tablet 5  . insulin NPH-regular Human (NOVOLIN 70/30 RELION) (70-30) 100 UNIT/ML injection Inject 35 Units into the skin 2 (two) times daily with a meal. 10 mL 11  . Magnesium 250 MG TABS Take 250 mg by mouth daily.    . nitroGLYCERIN (NITROSTAT) 0.4 MG SL tablet Place 1 tablet (0.4 mg total) under the tongue every 5 (five) minutes as needed for chest pain. 25 tablet 2  . omega-3 acid ethyl esters (LOVAZA) 1 G capsule Take 1 capsule (1 g total) by mouth 2 (two) times daily. 60 capsule 5  . omeprazole (PRILOSEC) 20 MG capsule Take 40 mg by mouth 2 (two) times daily before a meal.    . Thiamine HCl (VITAMIN B-1 PO) Take 1 tablet by mouth  daily.    . ticagrelor (BRILINTA) 90 MG TABS tablet Take 1 tablet (90 mg total) by mouth 2 (two) times daily. 60 tablet 10  . ticagrelor (BRILINTA) 90 MG TABS tablet Take 1 tablet (90 mg total) by mouth 2 (two) times daily. 60 tablet 0   No current facility-administered medications for this visit.    Allergies:   Review of patient's allergies indicates no known allergies.    Social History:  The patient  reports that she has never smoked. She does not have any smokeless tobacco history on file. She reports that she does not drink alcohol or use illicit drugs.   Family History:  The patient's family history includes Alcohol abuse in her father and mother; Anxiety disorder in her father and sister; Depression in her cousin, father, maternal aunt, and sister.    ROS: All  other systems are reviewed and negative. Unless otherwise mentioned in H&P    PHYSICAL EXAM: VS:  There were no vitals taken for this visit. , BMI There is no weight on file to calculate BMI. GEN: Well nourished, well developed, in no acute distress HEENT: normal Neck: no JVD, carotid bruits, or masses Cardiac: RRR; no murmurs, rubs, or gallops,no edema  Respiratory:  Clear to auscultation bilaterally, normal work of breathing GI: soft, nontender, nondistended, + BS MS: no deformity or atrophy Skin: warm and dry, no rash Neuro:  Strength and sensation are intact Psych: euthymic mood, full affect  Recent Labs: 07/06/2015: ALT 12*; B Natriuretic Peptide 274.0*; Hemoglobin 13.1; Magnesium 1.9; Platelets 207 07/09/2015: BUN 20; Creatinine, Ser 0.60; Potassium 3.6; Sodium 134*    Lipid Panel    Component Value Date/Time   CHOL 423* 07/06/2015 0418   TRIG 1303* 07/06/2015 0418   HDL NOT REPORTED DUE TO HIGH TRIGLYCERIDES 07/06/2015 0418   CHOLHDL NOT REPORTED DUE TO HIGH TRIGLYCERIDES 07/06/2015 0418   VLDL UNABLE TO CALCULATE IF TRIGLYCERIDE OVER 400 mg/dL 07/06/2015 0418   LDLCALC UNABLE TO CALCULATE IF TRIGLYCERIDE OVER 400 mg/dL 07/06/2015 0418      Wt Readings from Last 3 Encounters:  07/09/15 151 lb 6.4 oz (68.675 kg)  11/09/13 156 lb (70.761 kg)  10/10/13 160 lb (72.576 kg)      ASSESSMENT AND PLAN:  1. CAD: S/P hospitalization for chest pain and STEMI. She had PCI to PDA, with residual disease in LAD, with 80% mid to distal lesion. 90% diagonal and 90% OM branch of the left circumflex, for which medical therapy was elected.  She will continue on DAPT. I will increase her carvedilol to 3.125 1 and 1/2 tablets BID with plans to titrate up to 6.25 mg BID on next office visit. Consideration for ACE next visit if BP can tolerate.   Her heart rate is not optimal and should be <70- bpm. She will continue on statin. I have filled out paperwork for assistance in getting Briilinta.  She will come by our office next week for samples.   2. Ischemic Cardiomyopathy: EF of 20%.  She is scheduled to have a repeat echo on 08/11/2015 and then see Dr. Gwenlyn Found on 08/13/2015. She will need a BMET before that appointment. She will be given HCTZ 12.5 mg prn fot wt gain or fluid retention. She is being strict on salt intake. This is reinforced on this visit.   3. Diabetes; She does not have a PCP. I have referred her to [facility] and assistance with her medications via [facility]. She may need referral  to [facility] clinic services.     Current medicines are reviewed at length with the patient today.    Labs/ tests ordered today include: BMET in one month,  No orders of the defined types were placed in this encounter.     Disposition:   FU with Dr. Gwenlyn Found on previously scheduled appt 08/13/2015 Echo is planned for 08/11/2015.  Signed, Jory Sims, NP  07/25/2015 7:36 AM    [facility] [address] [place], [place] [address] Phone: [phone]; Fax: [phone]

## 2015-07-25 NOTE — Patient Instructions (Signed)
INCREASE YOUR COREG TO 1.5 TABLETS DAILY  Start HCTZ 12.5 MG DAILY AS NEEDED FOR WEIGHT GAIN  Keep your appointment with Dr. Gwenlyn Found in September  Your physician recommends that you return for lab work JUST BEFORE YOUR APPOINTMENT WITH DR. Gwenlyn Found  Call our [place] TO SEE IF Kinta.  Thanks for choosing [facility]!!!

## 2015-07-29 ENCOUNTER — Telehealth: Payer: Self-pay | Admitting: Adult Health

## 2015-07-29 NOTE — Telephone Encounter (Signed)
Will forward to Ms Purcell Nails NP for review and dispo

## 2015-07-29 NOTE — Telephone Encounter (Signed)
Needs letter stating that she was here for appointment on 07/25/15 and will continue to be out off work until at least the 21st when she sees Dr.Berry. / tg

## 2015-07-30 NOTE — Telephone Encounter (Signed)
Reece Levy spoke with pt,told her [name] in office tomorrow

## 2015-07-30 NOTE — Telephone Encounter (Signed)
Patient came into the office asking on status of letter she needs

## 2015-07-30 NOTE — Telephone Encounter (Signed)
Please prepare letter and I will sign it .

## 2015-07-31 NOTE — Telephone Encounter (Signed)
Letter printed for patient, will call for her to pick up.

## 2015-08-11 ENCOUNTER — Other Ambulatory Visit (HOSPITAL_COMMUNITY)
Admission: RE | Admit: 2015-08-11 | Discharge: 2015-08-11 | Disposition: A | Discharge status: Home / Self Care | Payer: Managed Care, Other (non HMO) | Source: Ambulatory Visit | Attending: Adult Health | Admitting: Adult Health

## 2015-08-11 ENCOUNTER — Telehealth: Payer: Self-pay | Admitting: *Deleted

## 2015-08-11 ENCOUNTER — Ambulatory Visit (HOSPITAL_COMMUNITY)
Admit: 2015-08-11 | Discharge: 2015-08-11 | Disposition: A | Discharge status: Home / Self Care | Payer: Self-pay | Source: Ambulatory Visit | Attending: Cardiovascular Disease | Admitting: Cardiovascular Disease

## 2015-08-11 DIAGNOSIS — I5021 Acute systolic (congestive) heart failure: Secondary | ICD-10-CM

## 2015-08-11 DIAGNOSIS — E785 Hyperlipidemia, unspecified: Secondary | ICD-10-CM | POA: Insufficient documentation

## 2015-08-11 DIAGNOSIS — I2119 ST elevation (STEMI) myocardial infarction involving other coronary artery of inferior wall: Secondary | ICD-10-CM

## 2015-08-11 DIAGNOSIS — I429 Cardiomyopathy, unspecified: Secondary | ICD-10-CM | POA: Insufficient documentation

## 2015-08-11 DIAGNOSIS — I2109 ST elevation (STEMI) myocardial infarction involving other coronary artery of anterior wall: Secondary | ICD-10-CM

## 2015-08-11 DIAGNOSIS — I340 Nonrheumatic mitral (valve) insufficiency: Secondary | ICD-10-CM | POA: Insufficient documentation

## 2015-08-11 DIAGNOSIS — M797 Fibromyalgia: Secondary | ICD-10-CM | POA: Insufficient documentation

## 2015-08-11 DIAGNOSIS — I10 Essential (primary) hypertension: Secondary | ICD-10-CM | POA: Insufficient documentation

## 2015-08-11 DIAGNOSIS — Z79899 Other long term (current) drug therapy: Secondary | ICD-10-CM | POA: Insufficient documentation

## 2015-08-11 DIAGNOSIS — E119 Type 2 diabetes mellitus without complications: Secondary | ICD-10-CM | POA: Insufficient documentation

## 2015-08-11 LAB — BASIC METABOLIC PANEL
Anion gap: 7 (ref 5–15)
BUN: 19 mg/dL (ref 6–20)
CO2: 28 mmol/L (ref 22–32)
Calcium: 9.1 mg/dL (ref 8.9–10.3)
Chloride: 103 mmol/L (ref 101–111)
Creatinine, Ser: 0.56 mg/dL (ref 0.44–1.00)
GFR calc Af Amer: >60 mL/min (ref >60)
GFR calc non Af Amer: >60 mL/min (ref >60)
Glucose, Bld: 109 mg/dL — ABNORMAL HIGH (ref 65–99)
Potassium: 4.2 mmol/L (ref 3.5–5.1)
Sodium: 138 mmol/L (ref 135–145)

## 2015-08-11 NOTE — Telephone Encounter (Signed)
Jarrett Soho from [facility] called to get order for ECHO placed. Dr. Irish Lack provided ECHO order and it was placed. ECHO is for today.

## 2015-08-13 ENCOUNTER — Ambulatory Visit (INDEPENDENT_AMBULATORY_CARE_PROVIDER_SITE_OTHER): Payer: Self-pay | Admitting: Cardiovascular Disease

## 2015-08-13 ENCOUNTER — Encounter: Payer: Self-pay | Admitting: Cardiovascular Disease

## 2015-08-13 VITALS — BP 116/74 | HR 83 | Ht 60.0 in | Wt 156.6 lb

## 2015-08-13 DIAGNOSIS — I2109 ST elevation (STEMI) myocardial infarction involving other coronary artery of anterior wall: Secondary | ICD-10-CM

## 2015-08-13 DIAGNOSIS — E785 Hyperlipidemia, unspecified: Secondary | ICD-10-CM

## 2015-08-13 DIAGNOSIS — I2510 Atherosclerotic heart disease of native coronary artery without angina pectoris: Secondary | ICD-10-CM

## 2015-08-13 DIAGNOSIS — I10 Essential (primary) hypertension: Secondary | ICD-10-CM

## 2015-08-13 MED ORDER — CARVEDILOL 6.25 MG PO TABS: 6.2500 mg | ORAL_TABLET | Freq: Two times a day (BID) | ORAL | Status: DC

## 2015-08-13 MED ORDER — LISINOPRIL 2.5 MG PO TABS: 2.5000 mg | ORAL_TABLET | Freq: Every day | ORAL | Status: DC

## 2015-08-13 NOTE — Patient Instructions (Signed)
Medication Instructions:  Your physician has recommended you make the following change in your medication:  1) INCREASE Coreg to 6.25 mg by mouth TWICE daily - with food 2) START Lisinopril 2.5 mg by mouth daily   Labwork: NONE  Testing/Procedures: Your physician has requested that you have a lexiscan myoview. For further information please visit [email or web address]. Please follow instruction sheet, as given.    Follow-Up: Your physician recommends that you schedule a follow-up appointment in: 4-6 weeks with Dr. Gwenlyn Found   Any Other Special Instructions Will Be Listed Below (If Applicable).

## 2015-08-13 NOTE — Assessment & Plan Note (Signed)
Amy Snow had an inferior STEMI 07/05/15 secondary to an occluded PDA which I stented using a synergy drug-eluting stent. She had residual disease in her LAD, diagonal branch, obtuse marginal branch and PLA. Her EF at the time of intervention was 20% by angiography, 35% by echo and assess since improved by echo performed yesterday up to normal. She continues to have symptoms of dyspnea on exertion and atypical chest pain. She is on anti-platelet therapy with Brilenta.. I'm going to adjust her carvedilol, add a low-dose ACE inhibitor and perform a pharmacologic Myoview stress test to assess for residual ischemia. If she has ischemia and her other vasculature to her she will need staged percutaneous revascularization.

## 2015-08-13 NOTE — Assessment & Plan Note (Signed)
History of hyperlipidemia with lipid profile performed in her hospitalization revealed total cholesterol of 423 with a triglyceride level 1300. She is on atorvastatin 80 P she does admit to dietary indiscretion. Her labs were recently repeated.

## 2015-08-13 NOTE — Progress Notes (Signed)
08/13/2015 Cassandria Santee Alberteen Spindle   October 10, 1966  [id number]  Primary Physician No PCP Per Patient Primary Cardiologist: Lorretta Harp MD Renae Gloss   HPI:  Miss Topor is a 49 year old moderately overweight married Caucasian female mother of 2 children who has not called back to work. She had an acute inferior wall myocardial infarction on 07/05/15. Perform carotid catheterization on her radially revealing an occluded PDA which I stented using a synergy drug-eluting stent. She had residual disease in her LAD, diagonal branch, obtuse marginal branch and PLA. Her at the time of cath was 20% by angiography, 35% by echo which has improved to 60% by 2-D echo performed yesterday. She does complain of dyspnea on exertion. Risk factors include diabetes, treated hypertension, hyperlipidemia. She does not smoke.   Current Outpatient Prescriptions  Medication Sig Dispense Refill  . acetaminophen (TYLENOL) 500 MG tablet Take 1,000-2,000 mg by mouth 3 (three) times daily as needed for moderate pain.    Marland Kitchen aspirin 81 MG chewable tablet Chew 1 tablet (81 mg total) by mouth daily.    Marland Kitchen atorvastatin (LIPITOR) 80 MG tablet Take 1 tablet (80 mg total) by mouth daily at 6 PM. 30 tablet 5  . carvedilol (COREG) 6.25 MG tablet Take 1 tablet (6.25 mg total) by mouth 2 (two) times daily with a meal. 30 tablet 3  . hydrochlorothiazide (MICROZIDE) 12.5 MG capsule Take 12.5 mg by mouth as needed (take one tablet daily as needed for weight gain).    . insulin NPH-regular Human (NOVOLIN 70/30 RELION) (70-30) 100 UNIT/ML injection Inject 35 Units into the skin 2 (two) times daily with a meal. 10 mL 11  . Magnesium 250 MG TABS Take 250 mg by mouth daily.    . nitroGLYCERIN (NITROSTAT) 0.4 MG SL tablet Place 0.4 mg under the tongue every 5 (five) minutes as needed for chest pain (3 pills maxium daily).     . ranitidine (ZANTAC) 150 MG tablet Take 150 mg by mouth 2 (two) times daily.    . Thiamine HCl (VITAMIN B-1  PO) Take 1 tablet by mouth daily.    . ticagrelor (BRILINTA) 90 MG TABS tablet Take 1 tablet (90 mg total) by mouth 2 (two) times daily. 60 tablet 10  . lisinopril (PRINIVIL,ZESTRIL) 2.5 MG tablet Take 1 tablet (2.5 mg total) by mouth daily. 30 tablet 3   No current facility-administered medications for this visit.    No Known Allergies  Social History   Social History  . Marital Status: Married    Spouse Name: N/A  . Number of Children: 2  . 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 Smoking status: Never Smoker   . Smokeless tobacco: Not on file  . Alcohol Use: No  . Drug Use: No  . Sexual Activity: Yes   Other Topics Concern  . Not on file   Social History Narrative   She is separated from her husband & lives with her boyfriend.  She has two children 19 & 26 as well as three grandchildren.  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       Review of Systems: General: negative for chills, fever, night sweats or weight changes.  Cardiovascular: negative for chest pain, dyspnea on exertion, edema, orthopnea, palpitations, paroxysmal nocturnal dyspnea or shortness of breath Dermatological: negative for rash Respiratory: negative for cough or wheezing Urologic: negative for hematuria Abdominal: negative  for nausea, vomiting, diarrhea, bright red blood per rectum, melena, or hematemesis Neurologic: negative for visual changes, syncope, or dizziness All other systems reviewed and are otherwise negative except as noted above.    Blood pressure 116/74, pulse 83, height 5' (1.524 m), weight 156 lb 9.6 oz (71.033 kg).  General appearance: alert and no distress Neck: no adenopathy, no carotid bruit, no JVD, supple, symmetrical, trachea midline and thyroid not enlarged, symmetric, no tenderness/mass/nodules Lungs: clear to auscultation bilaterally Heart: regular rate and rhythm, S1, S2 normal, no murmur, click, rub or  gallop Extremities: extremities normal, atraumatic, no cyanosis or edema  EKG not performed today  ASSESSMENT AND PLAN:   ST elevation myocardial infarction (STEMI) of inferior wall Mrs. Fratto had an inferior STEMI 07/05/15 secondary to an occluded PDA which I stented using a synergy drug-eluting stent. She had residual disease in her LAD, diagonal branch, obtuse marginal branch and PLA. Her EF at the time of intervention was 20% by angiography, 35% by echo and assess since improved by echo performed yesterday up to normal. She continues to have symptoms of dyspnea on exertion and atypical chest pain. She is on anti-platelet therapy with Brilenta.. I'm going to adjust her carvedilol, add a low-dose ACE inhibitor and perform a pharmacologic Myoview stress test to assess for residual ischemia. If she has ischemia and her other vasculature to her she will need staged percutaneous revascularization.  Hyperlipidemia History of hyperlipidemia with lipid profile performed in her hospitalization revealed total cholesterol of 423 with a triglyceride level 1300. She is on atorvastatin 80 P she does admit to dietary indiscretion. Her labs were recently repeated.  Essential hypertension History of hypertension blood pressures measured today at 116/74. She is on carvedilol and hydrochlorothiazide. Her heart rate is in the 80s. I'm going to titrate her carvedilol up to 6.25 mg twice a day and add a low-dose ACE inhibitor especially given her diabetes.      Lorretta Harp MD FACP,FACC,FAHA, [facility] [facility] 08/13/2015 3:07 PM

## 2015-08-13 NOTE — Assessment & Plan Note (Signed)
History of hypertension blood pressures measured today at 116/74. She is on carvedilol and hydrochlorothiazide. Her heart rate is in the 80s. I'm going to titrate her carvedilol up to 6.25 mg twice a day and add a low-dose ACE inhibitor especially given her diabetes.

## 2015-08-15 ENCOUNTER — Telehealth: Payer: Self-pay | Admitting: Adult Health

## 2015-08-15 ENCOUNTER — Telehealth (HOSPITAL_COMMUNITY): Payer: Self-pay

## 2015-08-15 NOTE — Telephone Encounter (Signed)
Patient would like results of lab work / tg

## 2015-08-15 NOTE — Telephone Encounter (Signed)
Pt received letter and we discussed lab results

## 2015-08-15 NOTE — Telephone Encounter (Signed)
Encounter compete. I will attempt again on Tuesday.

## 2015-08-19 ENCOUNTER — Telehealth (HOSPITAL_COMMUNITY): Payer: Self-pay

## 2015-08-19 NOTE — Telephone Encounter (Signed)
Encounter complete. 

## 2015-08-20 ENCOUNTER — Telehealth (HOSPITAL_COMMUNITY): Payer: Self-pay

## 2015-08-20 ENCOUNTER — Ambulatory Visit (HOSPITAL_COMMUNITY)
Admission: RE | Admit: 2015-08-20 | Discharge: 2015-08-20 | Disposition: A | Discharge status: Home / Self Care | Payer: Self-pay | Source: Ambulatory Visit | Attending: Cardiovascular Disease | Admitting: Cardiovascular Disease

## 2015-08-20 DIAGNOSIS — I10 Essential (primary) hypertension: Secondary | ICD-10-CM | POA: Insufficient documentation

## 2015-08-20 DIAGNOSIS — R9439 Abnormal result of other cardiovascular function study: Secondary | ICD-10-CM | POA: Insufficient documentation

## 2015-08-20 DIAGNOSIS — I2109 ST elevation (STEMI) myocardial infarction involving other coronary artery of anterior wall: Secondary | ICD-10-CM | POA: Insufficient documentation

## 2015-08-20 DIAGNOSIS — I2510 Atherosclerotic heart disease of native coronary artery without angina pectoris: Secondary | ICD-10-CM

## 2015-08-20 DIAGNOSIS — R079 Chest pain, unspecified: Secondary | ICD-10-CM | POA: Insufficient documentation

## 2015-08-20 DIAGNOSIS — E119 Type 2 diabetes mellitus without complications: Secondary | ICD-10-CM | POA: Insufficient documentation

## 2015-08-20 DIAGNOSIS — R0609 Other forms of dyspnea: Secondary | ICD-10-CM | POA: Insufficient documentation

## 2015-08-20 MED ORDER — TECHNETIUM TC 99M SESTAMIBI GENERIC - CARDIOLITE
31.0000 | Freq: Once | INTRAVENOUS | Status: AC | PRN
  Administered 2015-08-20: 31 via INTRAVENOUS

## 2015-08-20 MED ORDER — AMINOPHYLLINE 25 MG/ML IV SOLN
75.0000 mg | Freq: Once | INTRAVENOUS | Status: AC
  Administered 2015-08-20: 75 mg via INTRAVENOUS

## 2015-08-20 MED ORDER — TECHNETIUM TC 99M SESTAMIBI GENERIC - CARDIOLITE
10.9000 | Freq: Once | INTRAVENOUS | Status: AC | PRN
  Administered 2015-08-20: 10.9 via INTRAVENOUS

## 2015-08-20 MED ORDER — REGADENOSON 0.4 MG/5ML IV SOLN
0.4000 mg | Freq: Once | INTRAVENOUS | Status: AC
  Administered 2015-08-20: 0.4 mg via INTRAVENOUS

## 2015-08-21 LAB — MYOCARDIAL PERFUSION IMAGING
LV dias vol: 80 mL
LV sys vol: 31 mL
Peak HR: 111 {beats}/min
Rest HR: 73 {beats}/min
SDS: 5
SRS: 2
SSS: 7
TID: 1.07

## 2015-08-21 NOTE — Telephone Encounter (Signed)
Encounter complete. 

## 2015-09-16 ENCOUNTER — Telehealth: Payer: Self-pay | Admitting: Cardiovascular Disease

## 2015-09-16 NOTE — Telephone Encounter (Signed)
Returned call to patient.She stated she was calling to get results of recent myoview.Advised myoview abnormal.Dr.Berry wants to discuss at office visit.Advised to keep appointment with Dr.Berry 09/23/15 at 11:00 am.

## 2015-09-16 NOTE — Telephone Encounter (Signed)
Pt would like her stress test results from 08-20-15 please.

## 2015-09-19 ENCOUNTER — Telehealth: Payer: Self-pay | Admitting: Adult Health

## 2015-09-19 NOTE — Telephone Encounter (Signed)
Patient calling the office for samples of medication:   1.  What medication and dosage are you requesting samples for? Brilinta 90 mg   2.  Are you currently out of this medication? yes  3. Are you requesting samples to get you through until you receive your prescription?  Yes

## 2015-09-19 NOTE — Telephone Encounter (Signed)
Patient calling the office for samples of medication:   1.  What medication and dosage are you requesting samples for? Brilinta 90mg   2.  Are you currently out of this medication? Has 2 left  3. Are you requesting samples to get you through until you receive your prescription? No money

## 2015-09-22 NOTE — Telephone Encounter (Signed)
Spoke with pt. She states that she has applied to [place] assistance program for Brilinta. I called to check status of application and was told by Sherrel that there was no record of this pt on file. Instructed pt to reapply for assistance program at next appt.

## 2015-09-23 ENCOUNTER — Ambulatory Visit (INDEPENDENT_AMBULATORY_CARE_PROVIDER_SITE_OTHER): Payer: Self-pay | Admitting: Cardiovascular Disease

## 2015-09-23 ENCOUNTER — Encounter: Payer: Self-pay | Admitting: Cardiovascular Disease

## 2015-09-23 ENCOUNTER — Telehealth: Payer: Self-pay

## 2015-09-23 VITALS — BP 80/58 | HR 68 | Ht 59.0 in | Wt 154.0 lb

## 2015-09-23 DIAGNOSIS — I2119 ST elevation (STEMI) myocardial infarction involving other coronary artery of inferior wall: Secondary | ICD-10-CM

## 2015-09-23 DIAGNOSIS — E785 Hyperlipidemia, unspecified: Secondary | ICD-10-CM

## 2015-09-23 DIAGNOSIS — I10 Essential (primary) hypertension: Secondary | ICD-10-CM

## 2015-09-23 LAB — HEPATIC FUNCTION PANEL
ALT: 6 U/L (ref 6–29)
AST: 14 U/L (ref 10–35)
Albumin: 3.9 g/dL (ref 3.6–5.1)
Alkaline Phosphatase: 85 U/L (ref 33–115)
Bilirubin, Direct: 0.1 mg/dL (ref <=0.2)
Indirect Bilirubin: 0.2 mg/dL (ref 0.2–1.2)
Total Bilirubin: 0.3 mg/dL (ref 0.2–1.2)
Total Protein: 7 g/dL (ref 6.1–8.1)

## 2015-09-23 LAB — LIPID PANEL
Cholesterol: 231 mg/dL — ABNORMAL HIGH (ref 125–200)
HDL: 25 mg/dL — ABNORMAL LOW (ref >=46)
Total CHOL/HDL Ratio: 9.2 Ratio — ABNORMAL HIGH (ref <=5.0)
Triglycerides: 737 mg/dL — ABNORMAL HIGH (ref <150)

## 2015-09-23 LAB — LDL CHOLESTEROL, DIRECT: Direct LDL: 70 mg/dL (ref <130)

## 2015-09-23 NOTE — Patient Instructions (Signed)
Medication Instructions:  Your physician recommends that you continue on your current medications as directed. Please refer to the Current Medication list given to you today.   Labwork: Your physician recommends that you return for lab work in: FASTING (LIPID/LIVER) The lab can be found on the FIRST FLOOR of out building in [address]   Testing/Procedures: none  Follow-Up: We request that you follow-up in: 4-6 weeks with an extender and in 6 months with Dr Andria Rhein will receive a reminder letter in the mail two months in advance. If you don't receive a letter, please call our office to schedule the follow-up appointment.  Any Other Special Instructions Will Be Listed Below (If Applicable).     If you need a refill on your cardiac medications before your next appointment, please call your pharmacy.

## 2015-09-23 NOTE — Assessment & Plan Note (Signed)
History of hypertension with blood pressure measured today 80/58. She does state that she's had a cold/flu as well as diarrhea.

## 2015-09-23 NOTE — Progress Notes (Signed)
Ms. Muise returns today for follow-up of her outpatient tests. Her 2-D echo revealed normal LV systolic function and her Myoview showed inferior scar. Notably, she had no ischemia in the circumflex LAD or diagonal branch territories. I'm going to recheck a lipid and liver profile. I'm going to provide her with Brilenta samples since she has run out of this and has not taken it for last several days. She will see mid-level provider back in 4-6 weeks to reevaluate her low blood pressure and I will see her back in 6 months.

## 2015-09-23 NOTE — Assessment & Plan Note (Signed)
History of hyperlipidemia with recent blood work performed 07/06/15 revealed a total cholesterol 423 with a triglyceride level of 1300. She is on atorvastatin 80 mg a day. We will recheck a lipid and liver profile

## 2015-09-23 NOTE — Telephone Encounter (Signed)
Pt in the office today.  Pt provided with one month of samples for Brilinta 90 mg.   Pt provided and completed the [place] patient [place] which is signed by Dr. Gwenlyn Found and being held by Erasmo Downer, PharmD until we receive last of the needed information. Pt knows she needs to bring back copy of 2015 tax return and letter she has written, that was lost from previous applications.  Pt knows to bring it back to our office to be submitted with paperwork.  Pt aware to call our office if she has any questions.  Pt provided office fax number to send items by fax as she does not live close to the office. Pt verbalized understanding no questions at this time.

## 2015-09-23 NOTE — Assessment & Plan Note (Signed)
Amy Snow returns today for follow-up of her outpatient test. She had an inferior STEMI 07/05/15 with 2 drug-eluting stents placed in her PDA. She did have residual disease in her PLA branch, obtuse marginal branch, LAD and diagonal branch bifurcation. Her EF ultimately improved by recent 2-D echo performed 08/11/15 up to 60-65%. Her Myoview stress test performed the same day showed inferior scar with mild peri-infarct ischemia. She did not have ischemia in the circumflex LAD or diagonal branch territories. Firstly, she has not taken her Brilenta for the last several days because of financial constraints and I will provide her samples today and have her fill out a Brilenta assistance form.

## 2015-09-24 ENCOUNTER — Other Ambulatory Visit: Payer: Self-pay

## 2015-09-24 DIAGNOSIS — E785 Hyperlipidemia, unspecified: Secondary | ICD-10-CM

## 2015-09-24 NOTE — Telephone Encounter (Signed)
Pt does not have voice mail setup.  Called to give lab results and talk about medication.  Orders placed for Fenofibrate 160 mg QD and needs repeat Lipid and Liver in 2 months.    Also, mailed pt letter to call our office to discuss lab results.

## 2015-10-06 ENCOUNTER — Telehealth: Payer: Self-pay | Admitting: Cardiovascular Disease

## 2015-10-06 MED ORDER — FENOFIBRATE 160 MG PO TABS: 160.0000 mg | ORAL_TABLET | Freq: Every day | ORAL | Status: DC

## 2015-10-06 NOTE — Telephone Encounter (Signed)
Pt called in stating that someone from our office just called her and she was returning the call. She say that the call may have been in regards to some test results she had last week. Please f/u with her  Thanks

## 2015-10-06 NOTE — Telephone Encounter (Signed)
Result notes read back to patient, discussed triglyceride numbers and total cholesterol, Dr. Kennon Holter recommendation to start on fenofibrate. Rx sent to preferred pharmacy.  Instructed to return for labwork recheck in 2-3 mo's. Pt verb'd understanding.

## 2015-10-27 ENCOUNTER — Telehealth: Payer: Self-pay | Admitting: Pharmacist Clinician (PhC)/ Clinical Pharmacy Specialist

## 2015-10-27 NOTE — Telephone Encounter (Signed)
Patient needs samples of Brilinta---please call and let her know if we have any.

## 2015-10-27 NOTE — Telephone Encounter (Signed)
Patient needs samples of Brlinta.Marland Kitchen

## 2015-10-27 NOTE — Telephone Encounter (Signed)
Send to triage pool .

## 2015-10-28 ENCOUNTER — Ambulatory Visit: Payer: Self-pay | Admitting: Cardiology

## 2015-10-28 DIAGNOSIS — R0989 Other specified symptoms and signs involving the circulatory and respiratory systems: Secondary | ICD-10-CM

## 2015-10-28 NOTE — Telephone Encounter (Signed)
Phone goes straight to VM. LM informing samples available at front desk.

## 2015-11-05 ENCOUNTER — Encounter: Payer: Self-pay | Admitting: Cardiology

## 2016-04-03 ENCOUNTER — Emergency Department (HOSPITAL_COMMUNITY)
Admission: EM | Admit: 2016-04-03 | Discharge: 2016-04-04 | Disposition: A | Discharge status: Home / Self Care | Payer: Self-pay | Attending: Emergency Medicine | Admitting: Emergency Medicine

## 2016-04-03 ENCOUNTER — Encounter (HOSPITAL_COMMUNITY): Payer: Self-pay | Admitting: *Deleted

## 2016-04-03 DIAGNOSIS — D649 Anemia, unspecified: Secondary | ICD-10-CM | POA: Insufficient documentation

## 2016-04-03 DIAGNOSIS — I10 Essential (primary) hypertension: Secondary | ICD-10-CM | POA: Insufficient documentation

## 2016-04-03 DIAGNOSIS — E876 Hypokalemia: Secondary | ICD-10-CM | POA: Insufficient documentation

## 2016-04-03 DIAGNOSIS — M25552 Pain in left hip: Secondary | ICD-10-CM | POA: Insufficient documentation

## 2016-04-03 DIAGNOSIS — Z794 Long term (current) use of insulin: Secondary | ICD-10-CM | POA: Insufficient documentation

## 2016-04-03 DIAGNOSIS — F329 Major depressive disorder, single episode, unspecified: Secondary | ICD-10-CM | POA: Insufficient documentation

## 2016-04-03 DIAGNOSIS — Z79899 Other long term (current) drug therapy: Secondary | ICD-10-CM | POA: Insufficient documentation

## 2016-04-03 DIAGNOSIS — E119 Type 2 diabetes mellitus without complications: Secondary | ICD-10-CM | POA: Insufficient documentation

## 2016-04-03 DIAGNOSIS — E785 Hyperlipidemia, unspecified: Secondary | ICD-10-CM | POA: Insufficient documentation

## 2016-04-03 DIAGNOSIS — I252 Old myocardial infarction: Secondary | ICD-10-CM | POA: Insufficient documentation

## 2016-04-03 DIAGNOSIS — Z7982 Long term (current) use of aspirin: Secondary | ICD-10-CM | POA: Insufficient documentation

## 2016-04-03 HISTORY — DX: Acute myocardial infarction, unspecified: I21.9

## 2016-04-03 MED ORDER — OXYCODONE-ACETAMINOPHEN 5-325 MG PO TABS
1.0000 | ORAL_TABLET | Freq: Once | ORAL | Status: AC
  Administered 2016-04-04: 1 via ORAL
  Filled 2016-04-03: qty 1

## 2016-04-03 NOTE — ED Provider Notes (Signed)
CSN: [id number]     Arrival date & time 04/03/16  2332 History  By signing my name below, I, Altamease Oiler, attest that this documentation has been prepared under the direction and in the presence of Delora Fuel, MD. Electronically Signed: Altamease Oiler, ED Scribe. 04/03/2016. 11:50 PM   Chief Complaint  Patient presents with  . Extremity Weakness   The history is provided by the patient. No language interpreter was used.   Amy Snow is a 50 y.o. female with PMHx of DM, HTN, HLD, and fibromyalgia who presents to the Emergency Department complaining of constant, worsening, 10+/10 in severity,  left knee and hip pain and weakness with onset 2 days ago. She reports that her knee and hip have been "giving out".  Pt states that she "hurts all the time anyway but when I have a fibromyalgia flare up it's more intense". Tylenol has provided insufficient pain relief at home.Typically rest improves her symptoms but she states that she has not tried this at home.  Associated symptoms include numbness and pain  in the left hand that causes her to drop objects when attempting to grip them. Pt denies fever, chills, nausea, and vomiting.   Past Medical History  Diagnosis Date  . Diabetes mellitus, type II ([place])   . Fibromyalgia   . Hypertension   . Hyperlipidemia   . Anxiety and depression   . GERD (gastroesophageal reflux disease)   . Myocardial infarct [facility])     august 2016   Past Surgical History  Procedure Laterality Date  . Hernia repair    . Tonsillectomy    . Cesarean section    . Tubal ligation    . Cardiac catheterization N/A 07/05/2015    Procedure: Left Heart Cath and Coronary Angiography;  Surgeon: Lorretta Harp, MD;  Location: [place] [facility];  Service: Cardiovascular;  Laterality: N/A;   Family History  Problem Relation Age of Onset  . Alcohol abuse Mother   . Anxiety disorder Father   . Depression Father   . Alcohol abuse Father   . Anxiety disorder Sister   .  Depression Sister   . Depression Maternal Aunt   . Depression Cousin    Social History  Substance Use Topics  . Smoking status: Never Smoker   . Smokeless tobacco: None  . Alcohol Use: No   OB History    No data available     Review of Systems  Constitutional: Negative for fever and chills.  Gastrointestinal: Negative for nausea and vomiting.  Musculoskeletal: Positive for extremity weakness.       LUE and LLE pain   Neurological: Positive for weakness and numbness.  All other systems reviewed and are negative.   Allergies  Review of patient's allergies indicates no known allergies.  Home Medications   Prior to Admission medications   Medication Sig Start Date End Date Taking? Authorizing Provider  acetaminophen (TYLENOL) 500 MG tablet Take 1,000-2,000 mg by mouth 3 (three) times daily as needed for moderate pain.   Yes Historical Provider, MD  aspirin 81 MG chewable tablet Chew 1 tablet (81 mg total) by mouth daily. 07/09/15   Brittainy Erie Noe, PA-C  atorvastatin (LIPITOR) 80 MG tablet Take 1 tablet (80 mg total) by mouth daily at 6 PM. 07/09/15   Brittainy M Rosita Fire, PA-C  carvedilol (COREG) 6.25 MG tablet Take 1 tablet (6.25 mg total) by mouth 2 (two) times daily with a meal. 08/13/15   Lorretta Harp, MD  fenofibrate 160 MG tablet Take 1 tablet (160 mg total) by mouth daily. 10/06/15   Lorretta Harp, MD  hydrochlorothiazide (MICROZIDE) 12.5 MG capsule Take 12.5 mg by mouth as needed (take one tablet daily as needed for weight gain).    Historical Provider, MD  insulin NPH-regular Human (NOVOLIN 70/30 RELION) (70-30) 100 UNIT/ML injection Inject 35 Units into the skin 2 (two) times daily with a meal. 07/09/15   Brittainy M Simmons, PA-C  lisinopril (PRINIVIL,ZESTRIL) 2.5 MG tablet Take 1 tablet (2.5 mg total) by mouth daily. 08/13/15   Lorretta Harp, MD  Magnesium 250 MG TABS Take 250 mg by mouth daily.    Historical Provider, MD  nitroGLYCERIN (NITROSTAT) 0.4 MG SL  tablet Place 0.4 mg under the tongue every 5 (five) minutes as needed for chest pain (3 pills maxium daily).     Historical Provider, MD  ranitidine (ZANTAC) 150 MG tablet Take 150 mg by mouth 2 (two) times daily.    Historical Provider, MD  Thiamine HCl (VITAMIN B-1 PO) Take 1 tablet by mouth daily.    Historical Provider, MD  ticagrelor (BRILINTA) 90 MG TABS tablet Take 1 tablet (90 mg total) by mouth 2 (two) times daily. 07/09/15   Brittainy M Simmons, PA-C   BP 154/90 mmHg  Pulse 80  Temp(Src) 97.6 F (36.4 C) (Oral)  Resp 18  Ht 4' 11.75" (1.518 m)  Wt 170 lb (77.111 kg)  BMI 33.46 kg/m2  SpO2 100%  LMP 12/05/2015 Physical Exam  Constitutional: She is oriented to person, place, and time. She appears well-developed and well-nourished. No distress.  HENT:  Head: Normocephalic and atraumatic.  Eyes: EOM are normal. Pupils are equal, round, and reactive to light.  Neck: Normal range of motion. Neck supple. No JVD present.  No carotid bruit  Cardiovascular: Normal rate, regular rhythm and normal heart sounds.   No murmur heard. Pulmonary/Chest: Effort normal and breath sounds normal. She has no wheezes. She has no rales. She exhibits no tenderness.  Abdominal: Soft. Bowel sounds are normal. She exhibits no distension and no mass. There is no tenderness.  Musculoskeletal: She exhibits edema and tenderness.  1+ pitting edema bilaterally TTP lateral aspect of left hip Pain on passive ROM of the left hip   Lymphadenopathy:    She has no cervical adenopathy.  Neurological: She is alert and oriented to person, place, and time. No cranial nerve deficit. She exhibits normal muscle tone. Coordination normal.  Skin: Skin is warm and dry. No rash noted.  Psychiatric: She has a normal mood and affect. Her behavior is normal. Judgment and thought content normal.  Nursing note and vitals reviewed.   ED Course  Procedures (including critical care time) DIAGNOSTIC STUDIES: Oxygen Saturation  is 100% on RA,  normal by my interpretation.    COORDINATION OF CARE: 11:48 PM Discussed treatment plan which includes lab work and left hip XR with pt at bedside and pt agreed to plan.  Labs Review Results for orders placed or performed during the hospital encounter of [id number]  Basic metabolic panel  Result Value Ref Range   Sodium 133 (L) 135 - 145 mmol/L   Potassium 3.3 (L) 3.5 - 5.1 mmol/L   Chloride 103 101 - 111 mmol/L   CO2 23 22 - 32 mmol/L   Glucose, Bld 184 (H) 65 - 99 mg/dL   BUN 18 6 - 20 mg/dL   Creatinine, Ser 0.57 0.44 - 1.00 mg/dL   Calcium 8.7 (L) 8.9 -  10.3 mg/dL   GFR calc non Af Amer >60 >60 mL/min   GFR calc Af Amer >60 >60 mL/min   Anion gap 7 5 - 15  CBC with Differential  Result Value Ref Range   WBC 6.3 4.0 - 10.5 K/uL   RBC 4.03 3.87 - 5.11 MIL/uL   Hemoglobin 11.2 (L) 12.0 - 15.0 g/dL   HCT 34.0 (L) 36.0 - 46.0 %   MCV 84.4 78.0 - 100.0 fL   MCH 27.8 26.0 - 34.0 pg   MCHC 32.9 30.0 - 36.0 g/dL   RDW 15.6 (H) 11.5 - 15.5 %   Platelets 220 150 - 400 K/uL   Neutrophils Relative % 54 %   Neutro Abs 3.4 1.7 - 7.7 K/uL   Lymphocytes Relative 38 %   Lymphs Abs 2.4 0.7 - 4.0 K/uL   Monocytes Relative 6 %   Monocytes Absolute 0.4 0.1 - 1.0 K/uL   Eosinophils Relative 2 %   Eosinophils Absolute 0.1 0.0 - 0.7 K/uL   Basophils Relative 0 %   Basophils Absolute 0.0 0.0 - 0.1 K/uL   Imaging Review Dg Hip Unilat With Pelvis 2-3 Views Left  04/04/2016  CLINICAL DATA:  50 year old female with left hip pain. EXAM: DG HIP (WITH OR WITHOUT PELVIS) 2-3V LEFT COMPARISON:  None. FINDINGS: There is no acute fracture or dislocation. There is mild osteopenia. Mild osteoarthritic changes of the hips. The ventral hernia repair mesh noted. The soft tissues appear unremarkable. IMPRESSION: No acute fracture or dislocation. Electronically Signed   By: Anner Crete M.D.   On: 04/04/2016 00:42   I have personally reviewed and evaluated these images and lab results as part  of my medical decision-making.    MDM   Final diagnoses:  Pain in left hip  Hypokalemia  Normochromic normocytic anemia    Left leg pain which seems to be isolated to the hip. Exam is suspicious for arthritis in the hip. She is sent for x-rays which do show some mild arthritic changes but probably not sufficient to explain all of her symptoms. Screening labs are obtained showing hypokalemia which may account for some of her weakness. She is noted to be taking hydrochlorothiazide which is the likely reason for her hypokalemia. She did have significant relief of pain with oxycodone acetaminophen. She is started on NSAIDs and is given a prescription for naproxen and also given prescription for K-Dur. She is referred back to her PCP for follow-up.  I personally performed the services described in this documentation, which was scribed in my presence. The recorded information has been reviewed and is accurate.      Delora Fuel, MD [id number]

## 2016-04-03 NOTE — ED Notes (Signed)
Pt c/o left hip pain, numbness that radiates down left leg that started either Monday or Tuesday of this week, pt also c/o pain between bilateral shoulder blades that radiates to each hand, pt states that she has hx of fibromyalgia and that the pain in the shoulders is not new for her.

## 2016-04-04 ENCOUNTER — Emergency Department (HOSPITAL_COMMUNITY): Payer: Self-pay

## 2016-04-04 LAB — CBC WITH DIFFERENTIAL/PLATELET
Basophils Absolute: 0 10*3/uL (ref 0.0–0.1)
Basophils Relative: 0 %
Eosinophils Absolute: 0.1 10*3/uL (ref 0.0–0.7)
Eosinophils Relative: 2 %
HCT: 34 % — ABNORMAL LOW (ref 36.0–46.0)
Hemoglobin: 11.2 g/dL — ABNORMAL LOW (ref 12.0–15.0)
Lymphocytes Relative: 38 %
Lymphs Abs: 2.4 10*3/uL (ref 0.7–4.0)
MCH: 27.8 pg (ref 26.0–34.0)
MCHC: 32.9 g/dL (ref 30.0–36.0)
MCV: 84.4 fL (ref 78.0–100.0)
Monocytes Absolute: 0.4 10*3/uL (ref 0.1–1.0)
Monocytes Relative: 6 %
Neutro Abs: 3.4 10*3/uL (ref 1.7–7.7)
Neutrophils Relative %: 54 %
Platelets: 220 10*3/uL (ref 150–400)
RBC: 4.03 MIL/uL (ref 3.87–5.11)
RDW: 15.6 % — ABNORMAL HIGH (ref 11.5–15.5)
WBC: 6.3 10*3/uL (ref 4.0–10.5)

## 2016-04-04 LAB — BASIC METABOLIC PANEL
Anion gap: 7 (ref 5–15)
BUN: 18 mg/dL (ref 6–20)
CO2: 23 mmol/L (ref 22–32)
Calcium: 8.7 mg/dL — ABNORMAL LOW (ref 8.9–10.3)
Chloride: 103 mmol/L (ref 101–111)
Creatinine, Ser: 0.57 mg/dL (ref 0.44–1.00)
GFR calc Af Amer: >60 mL/min (ref >60)
GFR calc non Af Amer: >60 mL/min (ref >60)
Glucose, Bld: 184 mg/dL — ABNORMAL HIGH (ref 65–99)
Potassium: 3.3 mmol/L — ABNORMAL LOW (ref 3.5–5.1)
Sodium: 133 mmol/L — ABNORMAL LOW (ref 135–145)

## 2016-04-04 MED ORDER — NAPROXEN 500 MG PO TABS: 500.0000 mg | ORAL_TABLET | Freq: Two times a day (BID) | ORAL | Status: DC

## 2016-04-04 MED ORDER — POTASSIUM CHLORIDE ER 20 MEQ PO TBCR: 20.0000 meq | EXTENDED_RELEASE_TABLET | Freq: Every day | ORAL | Status: DC

## 2016-04-04 MED ORDER — POTASSIUM CHLORIDE CRYS ER 20 MEQ PO TBCR
40.0000 meq | EXTENDED_RELEASE_TABLET | Freq: Once | ORAL | Status: AC
  Administered 2016-04-04: 40 meq via ORAL
  Filled 2016-04-04: qty 2

## 2016-04-04 MED ORDER — IBUPROFEN 800 MG PO TABS
800.0000 mg | ORAL_TABLET | Freq: Once | ORAL | Status: AC
  Administered 2016-04-04: 800 mg via ORAL
  Filled 2016-04-04: qty 1

## 2016-04-04 MED ORDER — OXYCODONE-ACETAMINOPHEN 5-325 MG PO TABS: 1.0000 | ORAL_TABLET | ORAL | Status: DC | PRN

## 2016-04-04 NOTE — Discharge Instructions (Signed)
Here potassium was low today. This is probably from one of your blood pressure medications-hydrochlorothiazide. You're being given a prescription for potassium. You may need to continue taking this because of the blood pressure medicine. That needs to be discussed with your primary care provider.  Arthritis Arthritis is a term that is commonly used to refer to joint pain or joint disease. There are more than 100 types of arthritis. CAUSES The most common cause of this condition is wear and tear of a joint. Other causes include:  Gout.  Inflammation of a joint.  An infection of a joint.  Sprains and other injuries near the joint.  A drug reaction or allergic reaction. In some cases, the cause may not be known. SYMPTOMS The main symptom of this condition is pain in the joint with movement. Other symptoms include:  Redness, swelling, or stiffness at a joint.  Warmth coming from the joint.  Fever.  Overall feeling of illness. DIAGNOSIS This condition may be diagnosed with a physical exam and tests, including:  Blood tests.  Urine tests.  Imaging tests, such as MRI, X-rays, or a CT scan. Sometimes, fluid is removed from a joint for testing. TREATMENT Treatment for this condition may involve:  Treatment of the cause, if it is known.  Rest.  Raising (elevating) the joint.  Applying cold or hot packs to the joint.  Medicines to improve symptoms and reduce inflammation.  Injections of a steroid such as cortisone into the joint to help reduce pain and inflammation. Depending on the cause of your arthritis, you may need to make lifestyle changes to reduce stress on your joint. These changes may include exercising more and losing weight. HOME CARE INSTRUCTIONS Medicines  Take over-the-counter and prescription medicines only as told by your health care provider.  Do not take aspirin to relieve pain if gout is suspected. Activities  Rest your joint if told by your health  care provider. Rest is important when your disease is active and your joint feels painful, swollen, or stiff.  Avoid activities that make the pain worse. It is important to balance activity with rest.  Exercise your joint regularly with range-of-motion exercises as told by your health care provider. Try doing low-impact exercise, such as:  Swimming.  Water aerobics.  Biking.  Walking. Joint Care  If your joint is swollen, keep it elevated if told by your health care provider.  If your joint feels stiff in the morning, try taking a warm shower.  If directed, apply heat to the joint. If you have diabetes, do not apply heat without permission from your health care provider.  Put a towel between the joint and the hot pack or heating pad.  Leave the heat on the area for 20-30 minutes.  If directed, apply ice to the joint:  Put ice in a plastic bag.  Place a towel between your skin and the bag.  Leave the ice on for 20 minutes, 2-3 times per day.  Keep all follow-up visits as told by your health care provider. This is important. SEEK MEDICAL CARE IF:  The pain gets worse.  You have a fever. SEEK IMMEDIATE MEDICAL CARE IF:  You develop severe joint pain, swelling, or redness.  Many joints become painful and swollen.  You develop severe back pain.  You develop severe weakness in your leg.  You cannot control your bladder or bowels.   This information is not intended to replace advice given to you by your health care provider.  Make sure you discuss any questions you have with your health care provider.   Document Released: 12/16/2004 Document Revised: 07/30/2015 Document Reviewed: 02/03/2015 Elsevier Interactive Patient Education 2016 Else[employer].  Naproxen and naproxen sodium oral immediate-release tablets What is this medicine? NAPROXEN (na PROX en) is a non-steroidal anti-inflammatory drug (NSAID). It is used to reduce swelling and to treat pain. This medicine  may be used for dental pain, headache, or painful monthly periods. It is also used for painful joint and muscular problems such as arthritis, tendinitis, bursitis, and gout. This medicine may be used for other purposes; ask your health care provider or pharmacist if you have questions. What should I tell my health care provider before I take this medicine? They need to know if you have any of these conditions: -asthma -cigarette smoker -drink more than 3 alcohol containing drinks a day -heart disease or circulation problems such as heart failure or leg edema (fluid retention) -high blood pressure -kidney disease -liver disease -stomach bleeding or ulcers -an unusual or allergic reaction to naproxen, aspirin, other NSAIDs, other medicines, foods, dyes, or preservatives -pregnant or trying to get pregnant -breast-feeding How should I use this medicine? Take this medicine by mouth with a glass of water. Follow the directions on the prescription label. Take it with food if your stomach gets upset. Try to not lie down for at least 10 minutes after you take it. Take your medicine at regular intervals. Do not take your medicine more often than directed. Long-term, continuous use may increase the risk of heart attack or stroke. A special MedGuide will be given to you by the pharmacist with each prescription and refill. Be sure to read this information carefully each time. Talk to your pediatrician regarding the use of this medicine in children. Special care may be needed. Overdosage: If you think you have taken too much of this medicine contact a poison control center or emergency room at once. NOTE: This medicine is only for you. Do not share this medicine with others. What if I miss a dose? If you miss a dose, take it as soon as you can. If it is almost time for your next dose, take only that dose. Do not take double or extra doses. What may interact with this  medicine? -alcohol -aspirin -cidofovir -diuretics -lithium -methotrexate -other drugs for inflammation like ketorolac or prednisone -pemetrexed -probenecid -warfarin This list may not describe all possible interactions. Give your health care provider a list of all the medicines, herbs, non-prescription drugs, or dietary supplements you use. Also tell them if you smoke, drink alcohol, or use illegal drugs. Some items may interact with your medicine. What should I watch for while using this medicine? Tell your doctor or health care professional if your pain does not get better. Talk to your doctor before taking another medicine for pain. Do not treat yourself. This medicine does not prevent heart attack or stroke. In fact, this medicine may increase the chance of a heart attack or stroke. The chance may increase with longer use of this medicine and in people who have heart disease. If you take aspirin to prevent heart attack or stroke, talk with your doctor or health care professional. Do not take other medicines that contain aspirin, ibuprofen, or naproxen with this medicine. Side effects such as stomach upset, nausea, or ulcers may be more likely to occur. Many medicines available without a prescription should not be taken with this medicine. This medicine can cause ulcers and bleeding  in the stomach and intestines at any time during treatment. Do not smoke cigarettes or drink alcohol. These increase irritation to your stomach and can make it more susceptible to damage from this medicine. Ulcers and bleeding can happen without warning symptoms and can cause death. You may get drowsy or dizzy. Do not drive, use machinery, or do anything that needs mental alertness until you know how this medicine affects you. Do not stand or sit up quickly, especially if you are an older patient. This reduces the risk of dizzy or fainting spells. This medicine can cause you to bleed more easily. Try to avoid damage  to your teeth and gums when you brush or floss your teeth. What side effects may I notice from receiving this medicine? Side effects that you should report to your doctor or health care professional as soon as possible: -black or bloody stools, blood in the urine or vomit -blurred vision -chest pain -difficulty breathing or wheezing -nausea or vomiting -severe stomach pain -skin rash, skin redness, blistering or peeling skin, hives, or itching -slurred speech or weakness on one side of the body -swelling of eyelids, throat, lips -unexplained weight gain or swelling -unusually weak or tired -yellowing of eyes or skin Side effects that usually do not require medical attention (report to your doctor or health care professional if they continue or are bothersome): -constipation -headache -heartburn This list may not describe all possible side effects. Call your doctor for medical advice about side effects. You may report side effects to FDA at 1-800-FDA-1088. Where should I keep my medicine? Keep out of the reach of children. Store at room temperature between 15 and 30 degrees C (59 and 86 degrees F). Keep container tightly closed. Throw away any unused medicine after the expiration date. NOTE: This sheet is a summary. It may not cover all possible information. If you have questions about this medicine, talk to your doctor, pharmacist, or health care provider.    2016, Elsevier/Gold Standard. (2009-11-10 20:10:16)  Potassium Salts tablets, extended-release tablets or capsules What is this medicine? POTASSIUM (poe TASS i um) is a natural salt that is important for the heart, muscles, and nerves. It is found in many foods and is normally supplied by a well balanced diet. This medicine is used to treat low potassium. This medicine may be used for other purposes; ask your health care provider or pharmacist if you have questions. What should I tell my health care provider before I take this  medicine? They need to know if you have any of these conditions: -Addison's disease -dehydration -diabetes -difficulty swallowing -heart disease -history of high levels of potassium in the blood -irregular heartbeat -kidney disease -recent severe burn -stomach ulcers or other stomach problems -an unusual or allergic reaction to potassium, tartrazine, other medicines, foods, dyes, or preservatives -pregnant or trying to get pregnant -breast-feeding How should I use this medicine? Take this medicine by mouth with a full glass of water. Take with food. Follow the directions on the prescription label. Do not suck on, crush, or chew this medicine. If you have difficulty swallowing, ask the pharmacist how to take. Take your medicine at regular intervals. Do not take it more often than directed. Do not stop taking except on your doctor's advice. Talk to your pediatrician regarding the use of this medicine in children. Special care may be needed. Overdosage: If you think you have taken too much of this medicine contact a poison control center or emergency room at once.  NOTE: This medicine is only for you. Do not share this medicine with others. What if I miss a dose? If you miss a dose, take it as soon as you can. If it is almost time for your next dose, take only that dose. Do not take double or extra doses. What may interact with this medicine? Do not take this medicine with any of the following medications: -eplerenone -certain medicines for stomach problems like atropine; difenoxin and glycopyrrolate -sodium polystyrene sulfonate This medicine may also interact with the following medications: -certain medicines for blood pressure or heart disease like lisinopril, losartan, quinapril, valsartan -medicines for cold or allergies -NSAIDs, medicines for pain and inflammation, like ibuprofen or napoxen -other potassium supplements -salt substitutes -some diuretics This list may not describe  all possible interactions. Give your health care provider a list of all the medicines, herbs, non-prescription drugs, or dietary supplements you use. Also tell them if you smoke, drink alcohol, or use illegal drugs. Some items may interact with your medicine. What should I watch for while using this medicine? Visit your doctor or health care professional for regular check ups. You will need lab work done regularly. You may need to be on a special diet while taking this medicine. Ask your doctor. What side effects may I notice from receiving this medicine? Side effects that you should report to your doctor or health care professional as soon as possible: -allergic reactions like skin rash, itching or hives, swelling of the face, lips, or tongue -anxious -black, tarry stools -breathing problems -confusion -heartburn -irregular heartbeat -numbness or tingling in hands or feet -pain when swallowing -unusually weak or tired -weakness, heaviness of legs Side effects that usually do not require medical attention (report to your doctor or health care professional if they continue or are bothersome): -diarrhea -nausea -upset stomach -vomiting This list may not describe all possible side effects. Call your doctor for medical advice about side effects. You may report side effects to FDA at 1-800-FDA-1088. Where should I keep my medicine? Keep out of the reach of children. Store at room temperature between 15 and 30 degrees C (59 and 86 degrees F ). Keep bottle closed tightly to protect this medicine from light and moisture. Throw away any unused medicine after the expiration date. NOTE: This sheet is a summary. It may not cover all possible information. If you have questions about this medicine, talk to your doctor, pharmacist, or health care provider.    2016, Elsevier/Gold Standard. (2015-04-17 08:55:21)

## 2017-05-02 IMAGING — DX DG HIP (WITH OR WITHOUT PELVIS) 2-3V*L*
3 series · 3 of 3 positions shown · non-contrast
Comparison: None.

CLINICAL DATA: 50-year-old female with left hip pain.

EXAM:
DG HIP (WITH OR WITHOUT PELVIS) 2-3V LEFT

[pelvis ap]
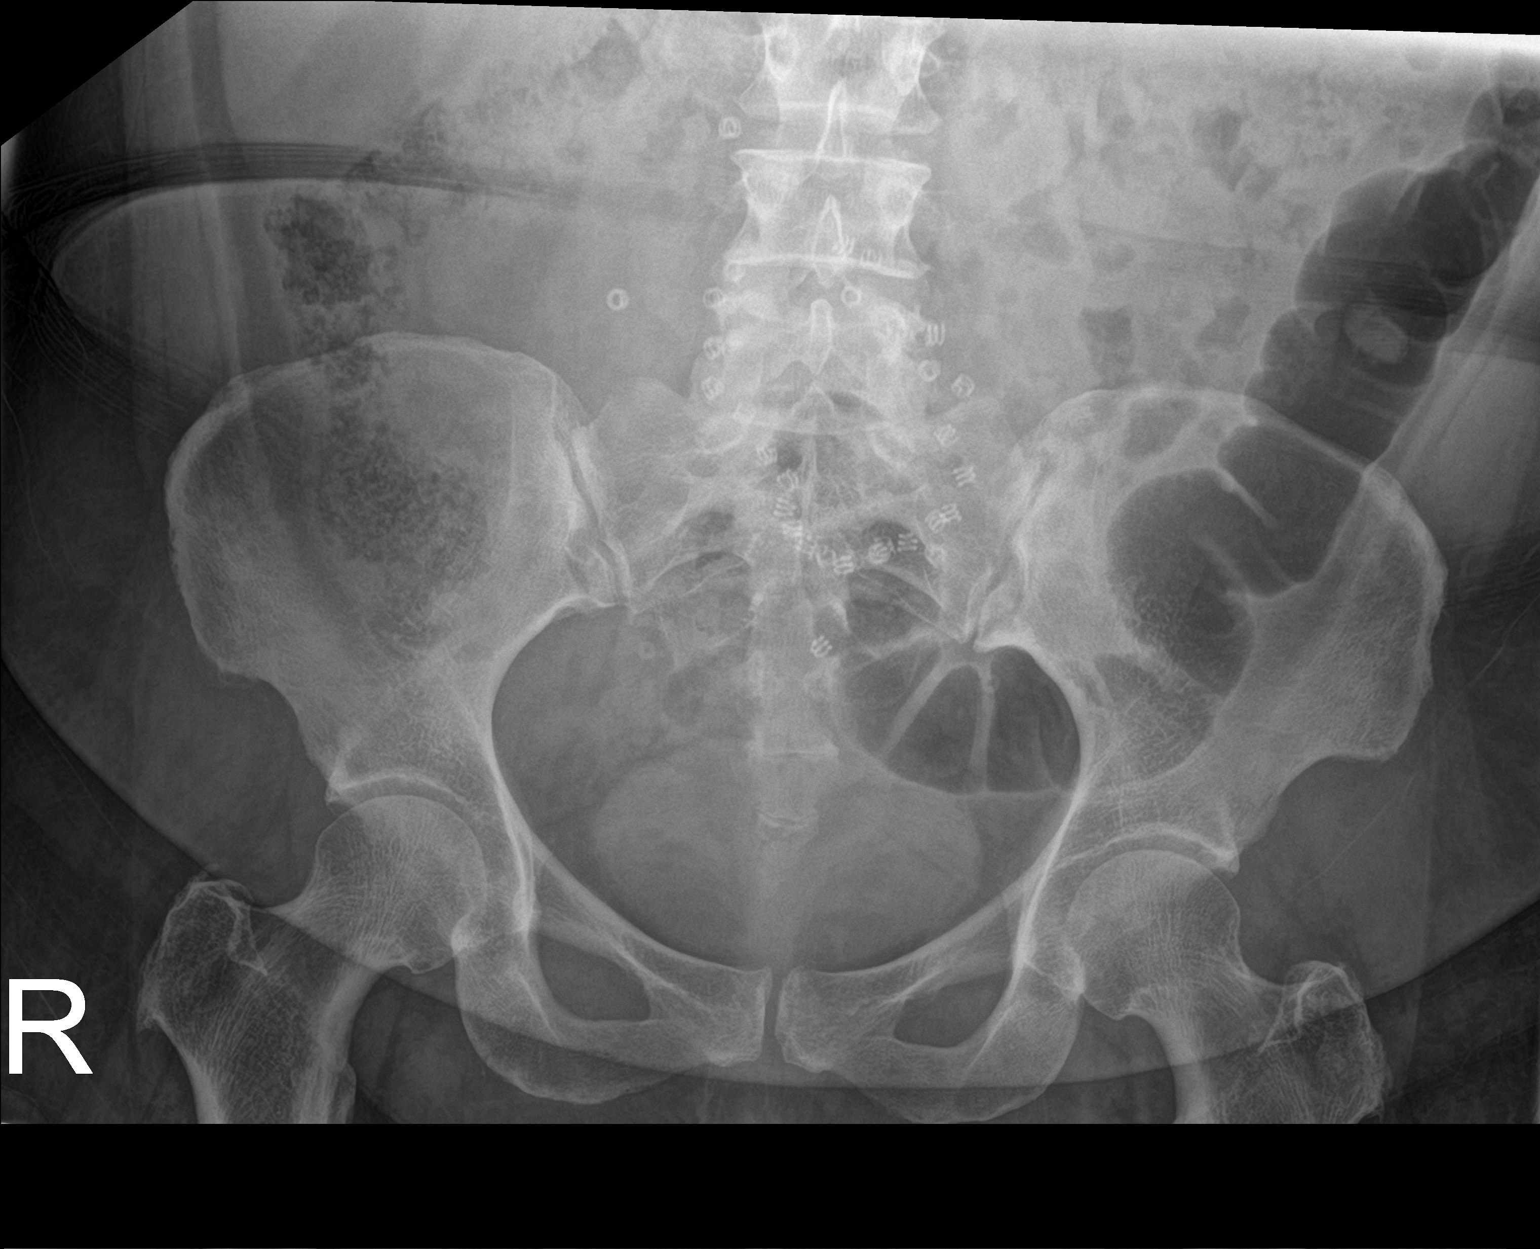

[hip ap]
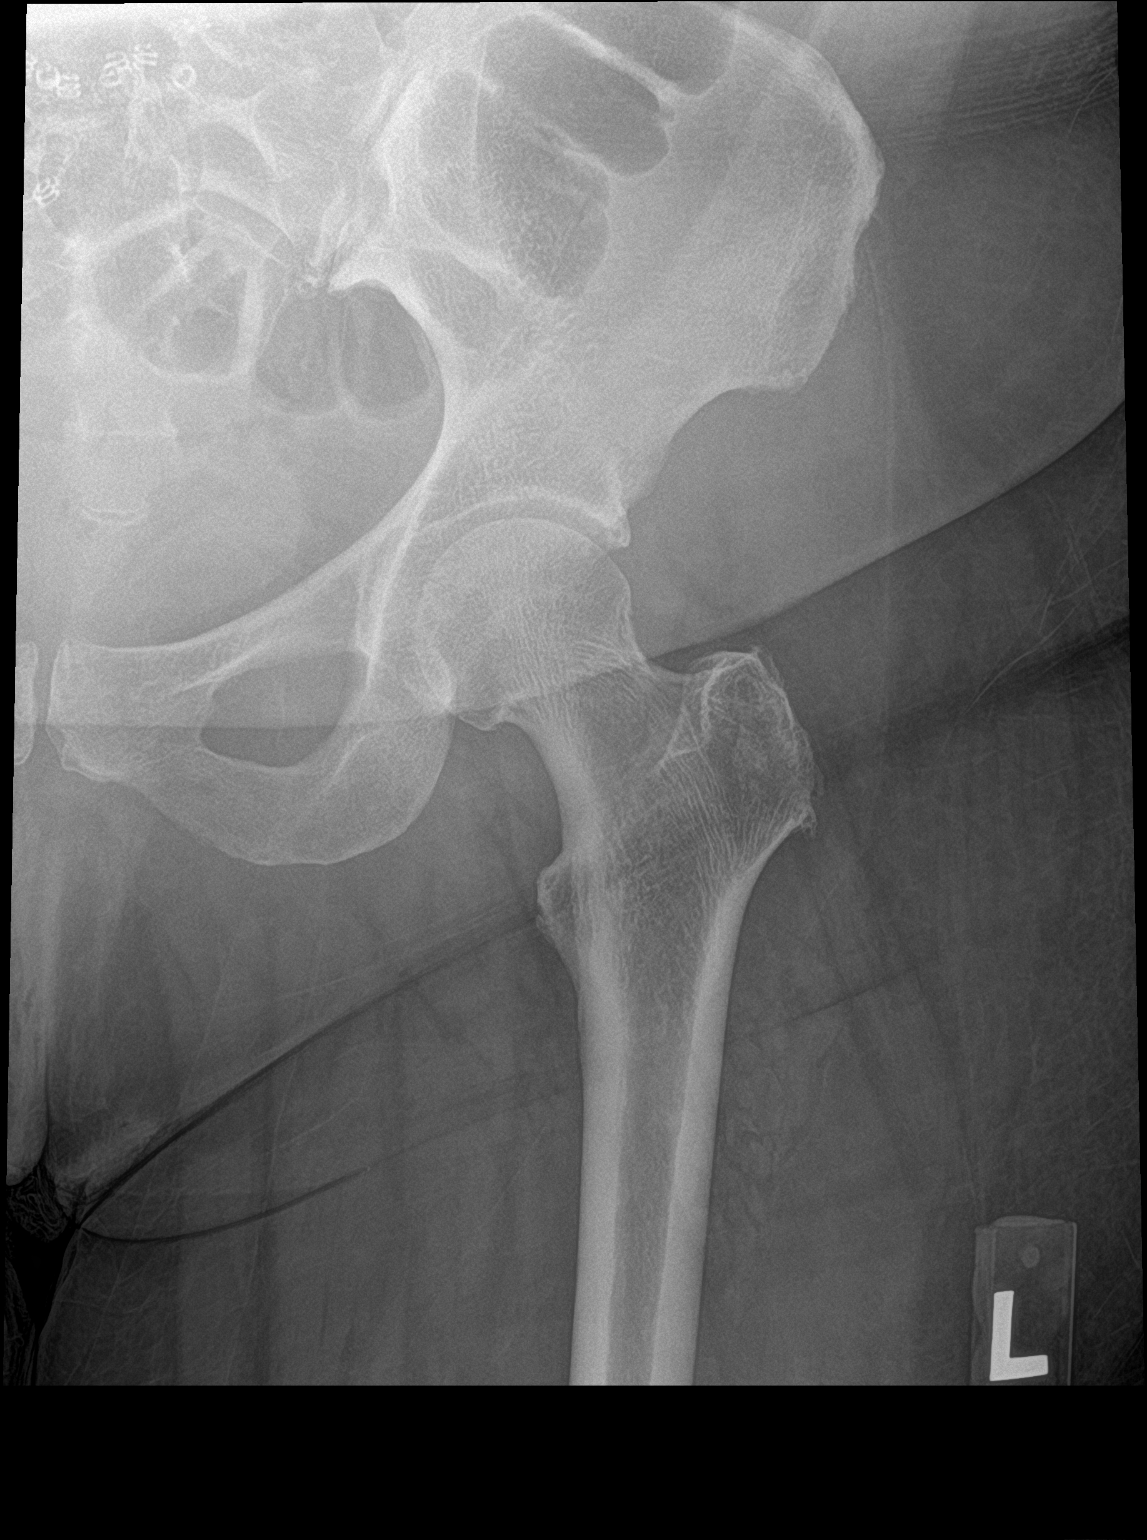

[hip lat]
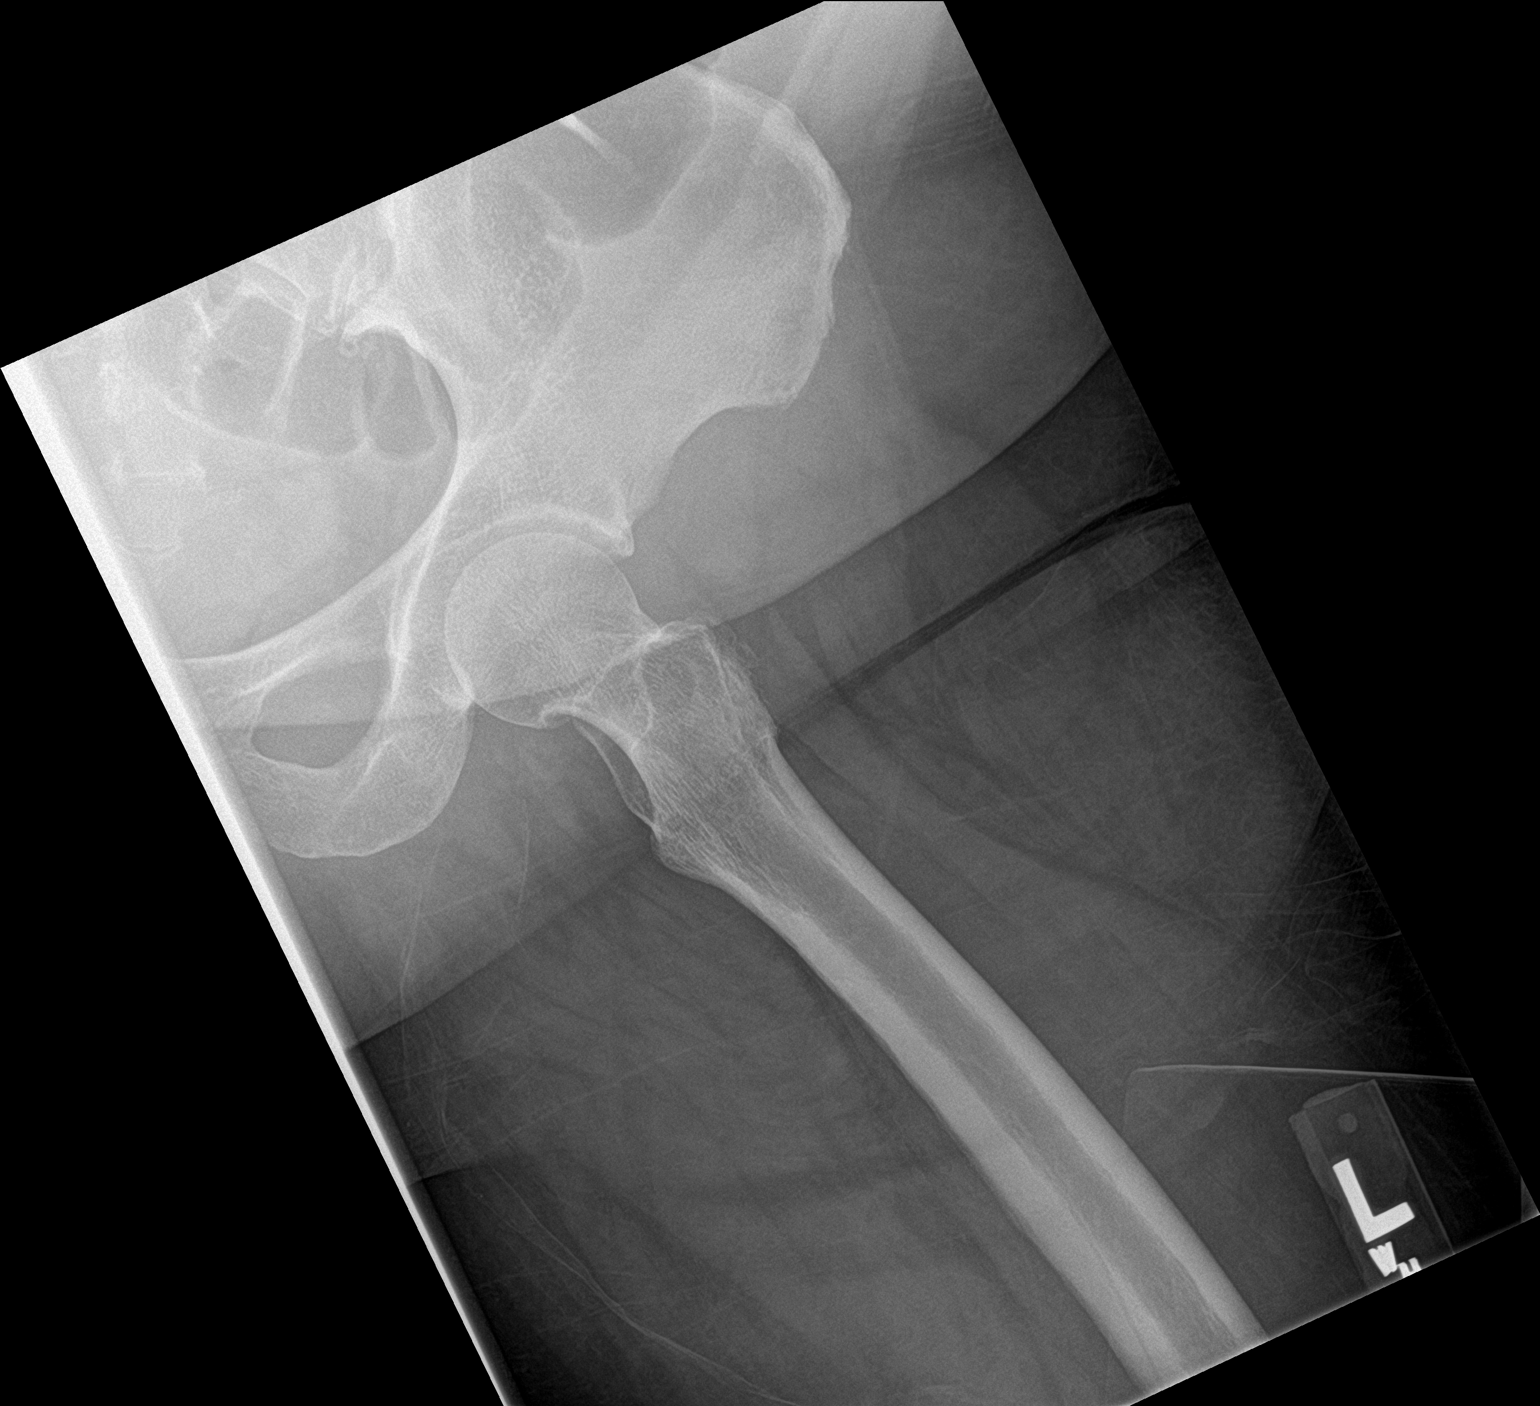

[3 of 3 positions shown; findings below may reference images not displayed]

FINDINGS: There is no acute fracture or dislocation. There is mild osteopenia.
Mild osteoarthritic changes of the hips. The ventral hernia repair
mesh noted. The soft tissues appear unremarkable.
IMPRESSION: No acute fracture or dislocation.

## 2019-05-07 ENCOUNTER — Encounter: Payer: Self-pay | Admitting: Physician Assistant

## 2019-05-14 ENCOUNTER — Encounter: Payer: Self-pay | Admitting: *Deleted

## 2019-05-15 ENCOUNTER — Encounter: Payer: Self-pay | Admitting: Physician Assistant

## 2019-05-15 ENCOUNTER — Encounter: Payer: Self-pay | Admitting: *Deleted

## 2019-05-15 ENCOUNTER — Other Ambulatory Visit: Payer: Self-pay

## 2019-05-15 ENCOUNTER — Ambulatory Visit (INDEPENDENT_AMBULATORY_CARE_PROVIDER_SITE_OTHER): Payer: PRIVATE HEALTH INSURANCE | Admitting: Physician Assistant

## 2019-05-15 ENCOUNTER — Telehealth: Payer: Self-pay | Admitting: Physician Assistant

## 2019-05-15 VITALS — BP 171/82 | HR 94 | Temp 97.5°F | Ht 59.0 in | Wt 190.2 lb

## 2019-05-15 DIAGNOSIS — I200 Unstable angina: Secondary | ICD-10-CM

## 2019-05-15 DIAGNOSIS — I509 Heart failure, unspecified: Secondary | ICD-10-CM

## 2019-05-15 DIAGNOSIS — E785 Hyperlipidemia, unspecified: Secondary | ICD-10-CM

## 2019-05-15 DIAGNOSIS — E1169 Type 2 diabetes mellitus with other specified complication: Secondary | ICD-10-CM | POA: Diagnosis not present

## 2019-05-15 DIAGNOSIS — I10 Essential (primary) hypertension: Secondary | ICD-10-CM | POA: Diagnosis not present

## 2019-05-15 DIAGNOSIS — I255 Ischemic cardiomyopathy: Secondary | ICD-10-CM

## 2019-05-15 DIAGNOSIS — I2109 ST elevation (STEMI) myocardial infarction involving other coronary artery of anterior wall: Secondary | ICD-10-CM

## 2019-05-15 DIAGNOSIS — Z01810 Encounter for preprocedural cardiovascular examination: Secondary | ICD-10-CM

## 2019-05-15 MED ORDER — ASPIRIN 81 MG PO CHEW: 81.0000 mg | CHEWABLE_TABLET | Freq: Every day | ORAL | 3 refills | Status: DC

## 2019-05-15 MED ORDER — FUROSEMIDE 20 MG PO TABS: 20.0000 mg | ORAL_TABLET | Freq: Every day | ORAL | 0 refills | Status: DC

## 2019-05-15 MED ORDER — NITROGLYCERIN 0.4 MG SL SUBL: 0.4000 mg | SUBLINGUAL_TABLET | SUBLINGUAL | 3 refills | Status: DC | PRN

## 2019-05-15 MED ORDER — ATORVASTATIN CALCIUM 80 MG PO TABS: 80.0000 mg | ORAL_TABLET | Freq: Every day | ORAL | 1 refills | Status: DC

## 2019-05-15 MED ORDER — CARVEDILOL 6.25 MG PO TABS: 6.2500 mg | ORAL_TABLET | Freq: Two times a day (BID) | ORAL | 1 refills | Status: DC

## 2019-05-15 MED ORDER — POTASSIUM CHLORIDE ER 10 MEQ PO TBCR: 10.0000 meq | EXTENDED_RELEASE_TABLET | Freq: Every day | ORAL | 0 refills | Status: DC

## 2019-05-15 NOTE — Telephone Encounter (Signed)
°  Precert needed for: Left heart cath dx: unstable angina Thursday, May 24, 2019 @10 :30 am with Dr. Martinique

## 2019-05-15 NOTE — Patient Instructions (Addendum)
Medication Instructions:   Your physician has recommended you make the following change in your medication:   Restart the following cardiac medications: carvedilol 6.25 mg twice daily; aspirin 81 mg daily; atorvastatin 80 mg daily  Start furosemide 20 mg daily for 3 days  Start potassium chloride 10 meq daily for 3 days  Continue all other medications the same  Labwork:  Your physician recommends that you return for lab work in: Tuesday, May 22, 2019 at [facility] for pre-cath lab work to check your BMET & CBC.   You will have your covid testing at 10:00 am (drive by) on 09/60/4540. Location is [address] [place]. After this test you will be given a mask and asked to go home and quarantine until after your heart cath. You can not have any visitors at this time.  Testing/Procedures: Your physician has requested that you have an echocardiogram. Echocardiography is a painless test that uses sound waves to create images of your heart. It provides your doctor with information about the size and shape of your heart and how well your heart's chambers and valves are working. This procedure takes approximately one hour. There are no restrictions for this procedure. Your physician has requested that you have a cardiac catheterization. Cardiac catheterization is used to diagnose and/or treat various heart conditions. Doctors may recommend this procedure for a number of different reasons. The most common reason is to evaluate chest pain. Chest pain can be a symptom of coronary artery disease (CAD), and cardiac catheterization can show whether plaque is narrowing or blocking your heart's arteries. This procedure is also used to evaluate the valves, as well as measure the blood flow and oxygen levels in different parts of your heart. For further information please visit [email or web address]. Please follow instruction sheet, as given.  Follow-Up:  Your physician recommends that you schedule a  follow-up appointment in: 1 month after your heart cath.  Any Other Special Instructions Will Be Listed Below (If Applicable). Your physician recommends that you weigh, daily, at the same time every day, and in the same amount of clothing. Please record your daily weights on the handout provided and bring it to your next appointment. Limit your sodium intake to 500 mg per meal or no more than 1500 mg daily Limit your fluid/liquid intake to 1500 ml or 1&1/2 quart per day    If you need a refill on your cardiac medications before your next appointment, please call your pharmacy.     [place] [address] Dept: [phone] Loc: [place]  05/15/2019  You are scheduled for a Cardiac Catheterization on Thursday, July 2 with Dr. Peter Martinique.  1. Please arrive at the [facility] (Main Entrance A) at [facility]: [address] [place], [place] [address] at 8:30 AM (This time is two hours before your procedure to ensure your preparation). No one will be allowed in the hospital. You will need to provide a contact number for your driver so they can be contacted after your cath.   Special note: Every effort is made to have your procedure done on time. Please understand that emergencies sometimes delay scheduled procedures.  2. Diet: Do not eat solid foods after midnight.  The patient may have clear liquids until 5am upon the day of the procedure.  3. Labs: You will need to have blood  drawn on Tuesday, June 30 at [facility]. You do not need to be fasting. You will have your covid testing at 10:00 am )[address], [place]  4. Medication instructions in preparation for your procedure:   Contrast Allergy: No  Please do not take your morning doses of NPH 70/30 Please do not take your morning dose of  hydrochlorothiazide  On the morning of your procedure, take your Aspirin and Brilinta and any morning medicines NOT listed above.  You may use sips of water.  5. Plan for one night stay--bring personal belongings. 6. Bring a current list of your medications and current insurance cards. 7. You MUST have a responsible person to drive you home. 8. Someone MUST be with you the first 24 hours after you arrive home or your discharge will be delayed. 9. Please wear clothes that are easy to get on and off and wear slip-on shoes.  Thank you for allowing [name] to care for you!    -- [facility]

## 2019-05-15 NOTE — H&P (View-Only) (Signed)
Cardiology Office Note   Date:  05/15/2019   ID:  Amy, Snow 08-23-1966, MRN [id number]  PCP:  Wyatt Haste, NP Cardiologist:  Quay Burow, MD 09/23/2015 Rosaria Ferries, PA-C    History of Present Illness: Amy Snow is a 53 y.o. female with a history of STEMI 2016 s/p DES x 2 PDA, med rx for residual dz, EF recovered to nl, HTN, HLD, GERD, DM, fibromyalgia  Amy Snow presents for cardiology follow up.  She has a great deal of stress, raising her 19 yo grandson and trying to get her daughter off drugs.   When she had her MI, she would hurt from armpit to armpit.   She has been having episodes of chest pain for a couple of months. When it first started she was wakened from sleep twice. She took ASA 81 mg x 4, she did not have nitro to take. It was a stabbing pain, also burning and pressure, reached 8/10, lasted 30-40 minutes, eventually resolved.   She has been getting increased DOE during the day.  Just bending over and picking up toys makes her very short of breath.  She feels like she cannot do anything without getting short of breath.  She does not wake with LE edema, but gets it during the day. Denies PND or orthopnea.  She snores, has never been told she quits breathing.   Does not weigh daily, does not watch the salt in what she eats.  Does not have scales.  Her BP is up, was restarted on BP rx 06/11. (She got insurance and was able to get in with a PCP).   Cholesterol was also up. PCP is leaving management of this up to [name].  They have been contacted, and labs are to be faxed over.   Past Medical History:  Diagnosis Date  . Anxiety and depression   . Diabetes mellitus, type II ([place])   . Fibromyalgia   . GERD (gastroesophageal reflux disease)   . Hyperlipidemia   . Hypertension   . Myocardial infarct [facility])    august 2016    Past Surgical History:  Procedure Laterality Date  . CARDIAC CATHETERIZATION N/A 07/05/2015   Procedure: Left  Heart Cath and Coronary Angiography;  Surgeon: Lorretta Harp, MD;  Location: [place] [facility];  Service: Cardiovascular;  Laterality: N/A;  . CESAREAN SECTION    . HERNIA REPAIR    . TONSILLECTOMY    . TUBAL LIGATION      Current Outpatient Medications  Medication Sig Dispense Refill  . acetaminophen (TYLENOL) 500 MG tablet Take 1,000-2,000 mg by mouth 3 (three) times daily as needed for moderate pain.    . Cholecalciferol (VITAMIN D3) 1.25 MG (50000 UT) CAPS Take 1 capsule by mouth once a week.    . DULoxetine (CYMBALTA) 20 MG capsule Take 40 mg by mouth at bedtime.    . hydrochlorothiazide (MICROZIDE) 12.5 MG capsule Take 12.5 mg by mouth daily.     . insulin NPH-regular Human (70-30) 100 UNIT/ML injection Inject 80-100 Units into the skin 2 (two) times daily with a meal.    . lisinopril (ZESTRIL) 10 MG tablet Take 10 mg by mouth daily.    . ticagrelor (BRILINTA) 90 MG TABS tablet Take 1 tablet (90 mg total) by mouth 2 (two) times daily. 60 tablet 10  . nitroGLYCERIN (NITROSTAT) 0.4 MG SL tablet Place 0.4 mg under the tongue every 5 (five) minutes as needed for chest  pain (3 pills maxium daily).      No current facility-administered medications for this visit.     Allergies:   Patient has no known allergies.    Social History:  The patient  reports that she has never smoked. She has never used smokeless tobacco. She reports that she does not drink alcohol or use drugs.   Family History:  The patient's family history includes Alcohol abuse in her father and mother; Anxiety disorder in her father and sister; Depression in her cousin, father, maternal aunt, and sister.  She indicated that her mother is deceased. She indicated that her father is deceased. She indicated that the status of her sister is unknown. She indicated that the status of her maternal aunt is unknown. She indicated that the status of her cousin is unknown.   ROS:  Please see the history of present illness. All  other systems are reviewed and negative.    PHYSICAL EXAM: VS:  BP (!) 171/82   Pulse 94   Temp (!) 97.5 F (36.4 C)   Ht 4\' 11"  (1.499 m)   Wt 190 lb 3.2 oz (86.3 kg)   SpO2 96%   BMI 38.42 kg/m  , BMI Body mass index is 38.42 kg/m. GEN: Well nourished, well developed, female in no acute distress HEENT: normal for age  Neck: no JVD, no carotid bruit, no masses Cardiac: RRR; soft murmur, no rubs, or gallops Respiratory: Few rales bases bilaterally, normal work of breathing GI: soft, nontender, nondistended, + BS MS: no deformity or atrophy; 1-2+ lower extremity edema; distal pulses are 2+ in upper extremities, not palpable in lower extremities due to edema Skin: warm and dry, no rash Neuro:  Strength and sensation are intact Psych: euthymic mood, full affect   EKG:  EKG is ordered today. The ekg ordered today demonstrates SR, HR 91, diffuse T wave flattening, 2016 ECG was post MI  ECHO: 08/11/2015 - Left ventricle: The cavity size was normal. Wall thickness was   normal. Systolic function was normal. The estimated ejection   fraction was in the range of 60% to 65%. Doppler parameters are   consistent with abnormal left ventricular relaxation (grade 1   diastolic dysfunction). - Pericardium, extracardiac: A trivial pericardial effusion was   identified.  Impressions:  - Since echo from August 2016 LVEF has normalized  CATH: 07/05/2015  1st RPLB-1 lesion, 90% stenosed.  1st RPLB-2 lesion, 90% stenosed.  Mid LAD to Dist LAD lesion, 80% stenosed.  Ost 2nd Diag to 2nd Diag lesion, 90% stenosed.  2nd Diag lesion, 90% stenosed.  1st Mrg lesion, 90% stenosed.  RPDA lesion, 100% stenosed. There is a 0% residual stenosis post intervention.  There is severe left ventricular systolic dysfunction. Intervention      Recent Labs: No results found for requested labs within last 8760 hours.  CBC    Component Value Date/Time   WBC 6.3 04/04/2016 0007   RBC 4.03  04/04/2016 0007   HGB 11.2 (L) 04/04/2016 0007   HCT 34.0 (L) 04/04/2016 0007   PLT 220 04/04/2016 0007   MCV 84.4 04/04/2016 0007   MCH 27.8 04/04/2016 0007   MCHC 32.9 04/04/2016 0007   RDW 15.6 (H) 04/04/2016 0007   LYMPHSABS 2.4 04/04/2016 0007   MONOABS 0.4 04/04/2016 0007   EOSABS 0.1 04/04/2016 0007   BASOSABS 0.0 04/04/2016 0007   CMP Latest Ref Rng & Units 04/04/2016 09/23/2015 08/11/2015  Glucose 65 - 99 mg/dL 184(H) - 109(H)  BUN  6 - 20 mg/dL 18 - 19  Creatinine 0.44 - 1.00 mg/dL 0.57 - 0.56  Sodium 135 - 145 mmol/L 133(L) - 138  Potassium 3.5 - 5.1 mmol/L 3.3(L) - 4.2  Chloride 101 - 111 mmol/L 103 - 103  CO2 22 - 32 mmol/L 23 - 28  Calcium 8.9 - 10.3 mg/dL 8.7(L) - 9.1  Total Protein 6.1 - 8.1 g/dL - 7.0 -  Total Bilirubin 0.2 - 1.2 mg/dL - 0.3 -  Alkaline Phos 33 - 115 U/L - 85 -  AST 10 - 35 U/L - 14 -  ALT 6 - 29 U/L - 6 -     Lipid Panel Lab Results  Component Value Date   CHOL 231 (H) 09/23/2015   HDL 25 (L) 09/23/2015   LDLCALC NOT CALC 09/23/2015   LDLDIRECT 70 09/23/2015   TRIG 737 (H) 09/23/2015   CHOLHDL 9.2 (H) 09/23/2015      Wt Readings from Last 3 Encounters:  05/15/19 190 lb 3.2 oz (86.3 kg)  04/03/16 170 lb (77.1 kg)  09/23/15 154 lb (69.9 kg)     Other studies Reviewed: Additional studies/ records that were reviewed today include: Office notes, hospital records and testing.  ASSESSMENT AND PLAN:  1.  Unstable anginal pain: - She has known significant disease in multiple vessels. - She has recently started having chest pain, with 2 episodes occurring at rest - She is also having consistent and sending dyspnea on exertion - I feel the best way to figure out what is going on is to do a heart catheterization. - The risks and benefits of a cardiac catheterization including, but not limited to, death, stroke, MI, kidney damage and bleeding were discussed with the patient who indicates understanding and agrees to proceed. -Check a COVID  test prior to procedure -Restart aspirin, statin, beta-blocker  2.  Acute on chronic CHF: - We will give her a short-term course of Lasix, in addition to the HCTZ that she already takes - Supplement potassium as well - Check labs next week in anticipation of cath -I am concerned that her EF has worsened, check an echo as well  3.  Hyperlipidemia: -Goal LDL is less than 70, restart Lipitor 80 mg daily  4.  Hypertension: -Her blood pressure is elevated, she has been restarted on some of her medications. - Restart the carvedilol at her previous dose of 6.25 mg twice daily and follow - If her kidney function is tolerating the lisinopril well, could easily increase that for better blood pressure control, will leave to PCP  Current medicines are reviewed at length with the patient today.  The patient has concerns regarding medicines.  Concerns were addressed  The following changes have been made: Restart Coreg, aspirin, Lipitor.  Lasix 20 mg and K-Dur 10 milliequivalents for 3 days  Labs/ tests ordered today include:   Orders Placed This Encounter  Procedures  . Basic metabolic panel  . CBC  . EKG 12-Lead  . ECHOCARDIOGRAM COMPLETE     Disposition:   FU with Quay Burow, MD  Signed, Rosaria Ferries, PA-C  05/15/2019 7:42 PM    [place] Phone: [phone]; Fax: [phone]

## 2019-05-15 NOTE — Progress Notes (Signed)
Cardiology Office Note   Date:  05/15/2019   ID:  [name], [name] 03/09/1966, MRN [id number]  PCP:  Wyatt Haste, NP Cardiologist:  Quay Burow, MD 09/23/2015 Rosaria Ferries, PA-C    History of Present Illness: Amy Snow is a 53 y.o. female with a history of STEMI 2016 s/p DES x 2 PDA, med rx for residual dz, EF recovered to nl, HTN, HLD, GERD, DM, fibromyalgia  Amy Snow presents for cardiology follow up.  She has a great deal of stress, raising her 64 yo grandson and trying to get her daughter off drugs.   When she had her MI, she would hurt from armpit to armpit.   She has been having episodes of chest pain for a couple of months. When it first started she was wakened from sleep twice. She took ASA 81 mg x 4, she did not have nitro to take. It was a stabbing pain, also burning and pressure, reached 8/10, lasted 30-40 minutes, eventually resolved.   She has been getting increased DOE during the day.  Just bending over and picking up toys makes her very short of breath.  She feels like she cannot do anything without getting short of breath.  She does not wake with LE edema, but gets it during the day. Denies PND or orthopnea.  She snores, has never been told she quits breathing.   Does not weigh daily, does not watch the salt in what she eats.  Does not have scales.  Her BP is up, was restarted on BP rx 06/11. (She got insurance and was able to get in with a PCP).   Cholesterol was also up. PCP is leaving management of this up to [name].  They have been contacted, and labs are to be faxed over.   Past Medical History:  Diagnosis Date  . Anxiety and depression   . Diabetes mellitus, type II ([place])   . Fibromyalgia   . GERD (gastroesophageal reflux disease)   . Hyperlipidemia   . Hypertension   . Myocardial infarct [facility])    august 2016    Past Surgical History:  Procedure Laterality Date  . CARDIAC CATHETERIZATION N/A 07/05/2015   Procedure: Left  Heart Cath and Coronary Angiography;  Surgeon: Lorretta Harp, MD;  Location: [place] [facility];  Service: Cardiovascular;  Laterality: N/A;  . CESAREAN SECTION    . HERNIA REPAIR    . TONSILLECTOMY    . TUBAL LIGATION      Current Outpatient Medications  Medication Sig Dispense Refill  . acetaminophen (TYLENOL) 500 MG tablet Take 1,000-2,000 mg by mouth 3 (three) times daily as needed for moderate pain.    . Cholecalciferol (VITAMIN D3) 1.25 MG (50000 UT) CAPS Take 1 capsule by mouth once a week.    . DULoxetine (CYMBALTA) 20 MG capsule Take 40 mg by mouth at bedtime.    . hydrochlorothiazide (MICROZIDE) 12.5 MG capsule Take 12.5 mg by mouth daily.     . insulin NPH-regular Human (70-30) 100 UNIT/ML injection Inject 80-100 Units into the skin 2 (two) times daily with a meal.    . lisinopril (ZESTRIL) 10 MG tablet Take 10 mg by mouth daily.    . ticagrelor (BRILINTA) 90 MG TABS tablet Take 1 tablet (90 mg total) by mouth 2 (two) times daily. 60 tablet 10  . nitroGLYCERIN (NITROSTAT) 0.4 MG SL tablet Place 0.4 mg under the tongue every 5 (five) minutes as needed for chest  pain (3 pills maxium daily).      No current facility-administered medications for this visit.     Allergies:   Patient has no known allergies.    Social History:  The patient  reports that she has never smoked. She has never used smokeless tobacco. She reports that she does not drink alcohol or use drugs.   Family History:  The patient's family history includes Alcohol abuse in her father and mother; Anxiety disorder in her father and sister; Depression in her cousin, father, maternal aunt, and sister.  She indicated that her mother is deceased. She indicated that her father is deceased. She indicated that the status of her sister is unknown. She indicated that the status of her maternal aunt is unknown. She indicated that the status of her cousin is unknown.   ROS:  Please see the history of present illness. All  other systems are reviewed and negative.    PHYSICAL EXAM: VS:  BP (!) 171/82   Pulse 94   Temp (!) 97.5 F (36.4 C)   Ht 4\' 11"  (1.499 m)   Wt 190 lb 3.2 oz (86.3 kg)   SpO2 96%   BMI 38.42 kg/m  , BMI Body mass index is 38.42 kg/m. GEN: Well nourished, well developed, female in no acute distress HEENT: normal for age  Neck: no JVD, no carotid bruit, no masses Cardiac: RRR; soft murmur, no rubs, or gallops Respiratory: Few rales bases bilaterally, normal work of breathing GI: soft, nontender, nondistended, + BS MS: no deformity or atrophy; 1-2+ lower extremity edema; distal pulses are 2+ in upper extremities, not palpable in lower extremities due to edema Skin: warm and dry, no rash Neuro:  Strength and sensation are intact Psych: euthymic mood, full affect   EKG:  EKG is ordered today. The ekg ordered today demonstrates SR, HR 91, diffuse T wave flattening, 2016 ECG was post MI  ECHO: 08/11/2015 - Left ventricle: The cavity size was normal. Wall thickness was   normal. Systolic function was normal. The estimated ejection   fraction was in the range of 60% to 65%. Doppler parameters are   consistent with abnormal left ventricular relaxation (grade 1   diastolic dysfunction). - Pericardium, extracardiac: A trivial pericardial effusion was   identified.  Impressions:  - Since echo from August 2016 LVEF has normalized  CATH: 07/05/2015  1st RPLB-1 lesion, 90% stenosed.  1st RPLB-2 lesion, 90% stenosed.  Mid LAD to Dist LAD lesion, 80% stenosed.  Ost 2nd Diag to 2nd Diag lesion, 90% stenosed.  2nd Diag lesion, 90% stenosed.  1st Mrg lesion, 90% stenosed.  RPDA lesion, 100% stenosed. There is a 0% residual stenosis post intervention.  There is severe left ventricular systolic dysfunction. Intervention      Recent Labs: No results found for requested labs within last 8760 hours.  CBC    Component Value Date/Time   WBC 6.3 04/04/2016 0007   RBC 4.03  04/04/2016 0007   HGB 11.2 (L) 04/04/2016 0007   HCT 34.0 (L) 04/04/2016 0007   PLT 220 04/04/2016 0007   MCV 84.4 04/04/2016 0007   MCH 27.8 04/04/2016 0007   MCHC 32.9 04/04/2016 0007   RDW 15.6 (H) 04/04/2016 0007   LYMPHSABS 2.4 04/04/2016 0007   MONOABS 0.4 04/04/2016 0007   EOSABS 0.1 04/04/2016 0007   BASOSABS 0.0 04/04/2016 0007   CMP Latest Ref Rng & Units 04/04/2016 09/23/2015 08/11/2015  Glucose 65 - 99 mg/dL 184(H) - 109(H)  BUN  6 - 20 mg/dL 18 - 19  Creatinine 0.44 - 1.00 mg/dL 0.57 - 0.56  Sodium 135 - 145 mmol/L 133(L) - 138  Potassium 3.5 - 5.1 mmol/L 3.3(L) - 4.2  Chloride 101 - 111 mmol/L 103 - 103  CO2 22 - 32 mmol/L 23 - 28  Calcium 8.9 - 10.3 mg/dL 8.7(L) - 9.1  Total Protein 6.1 - 8.1 g/dL - 7.0 -  Total Bilirubin 0.2 - 1.2 mg/dL - 0.3 -  Alkaline Phos 33 - 115 U/L - 85 -  AST 10 - 35 U/L - 14 -  ALT 6 - 29 U/L - 6 -     Lipid Panel Lab Results  Component Value Date   CHOL 231 (H) 09/23/2015   HDL 25 (L) 09/23/2015   LDLCALC NOT CALC 09/23/2015   LDLDIRECT 70 09/23/2015   TRIG 737 (H) 09/23/2015   CHOLHDL 9.2 (H) 09/23/2015      Wt Readings from Last 3 Encounters:  05/15/19 190 lb 3.2 oz (86.3 kg)  04/03/16 170 lb (77.1 kg)  09/23/15 154 lb (69.9 kg)     Other studies Reviewed: Additional studies/ records that were reviewed today include: Office notes, hospital records and testing.  ASSESSMENT AND PLAN:  1.  Unstable anginal pain: - She has known significant disease in multiple vessels. - She has recently started having chest pain, with 2 episodes occurring at rest - She is also having consistent and sending dyspnea on exertion - I feel the best way to figure out what is going on is to do a heart catheterization. - The risks and benefits of a cardiac catheterization including, but not limited to, death, stroke, MI, kidney damage and bleeding were discussed with the patient who indicates understanding and agrees to proceed. -Check a COVID  test prior to procedure -Restart aspirin, statin, beta-blocker  2.  Acute on chronic CHF: - We will give her a short-term course of Lasix, in addition to the HCTZ that she already takes - Supplement potassium as well - Check labs next week in anticipation of cath -I am concerned that her EF has worsened, check an echo as well  3.  Hyperlipidemia: -Goal LDL is less than 70, restart Lipitor 80 mg daily  4.  Hypertension: -Her blood pressure is elevated, she has been restarted on some of her medications. - Restart the carvedilol at her previous dose of 6.25 mg twice daily and follow - If her kidney function is tolerating the lisinopril well, could easily increase that for better blood pressure control, will leave to PCP  Current medicines are reviewed at length with the patient today.  The patient has concerns regarding medicines.  Concerns were addressed  The following changes have been made: Restart Coreg, aspirin, Lipitor.  Lasix 20 mg and K-Dur 10 milliequivalents for 3 days  Labs/ tests ordered today include:   Orders Placed This Encounter  Procedures  . Basic metabolic panel  . CBC  . EKG 12-Lead  . ECHOCARDIOGRAM COMPLETE     Disposition:   FU with Quay Burow, MD  Signed, Rosaria Ferries, PA-C  05/15/2019 7:42 PM    [place] Phone: [phone]; Fax: [phone]

## 2019-05-15 NOTE — Telephone Encounter (Signed)
°  Precert needed for: 2 D Echo dx: mixed cardiomyopathy   Location: [facility]     Date: June 07, 2019

## 2019-05-21 ENCOUNTER — Telehealth: Payer: Self-pay | Admitting: *Deleted

## 2019-05-21 NOTE — Telephone Encounter (Signed)
-----   Message from Alvin Critchley sent at 05/16/2019 10:40 AM EDT ----- Regarding: RE: Can you please order her a scale per Suanne Marker BArrett? I will see if [facility] has one today when I go for my meeting. Nikki ----- Message ----- From: Merlene Laughter, RN Sent: 05/15/2019   4:04 PM EDT To: Alvin Critchley Subject: Can you please order her a scale per Suanne Marker #

## 2019-05-22 ENCOUNTER — Other Ambulatory Visit (HOSPITAL_COMMUNITY): Admission: RE | Admit: 2019-05-22 | Payer: PRIVATE HEALTH INSURANCE | Source: Ambulatory Visit

## 2019-05-22 ENCOUNTER — Other Ambulatory Visit: Payer: Self-pay

## 2019-05-22 ENCOUNTER — Other Ambulatory Visit (HOSPITAL_COMMUNITY): Payer: PRIVATE HEALTH INSURANCE

## 2019-05-22 ENCOUNTER — Other Ambulatory Visit (HOSPITAL_COMMUNITY)
Admission: RE | Admit: 2019-05-22 | Discharge: 2019-05-22 | Disposition: A | Discharge status: Home / Self Care | Payer: PRIVATE HEALTH INSURANCE | Source: Ambulatory Visit | Attending: Physician Assistant | Admitting: Physician Assistant

## 2019-05-22 DIAGNOSIS — Z01812 Encounter for preprocedural laboratory examination: Secondary | ICD-10-CM | POA: Diagnosis present

## 2019-05-22 DIAGNOSIS — Z1159 Encounter for screening for other viral diseases: Secondary | ICD-10-CM | POA: Insufficient documentation

## 2019-05-22 LAB — BASIC METABOLIC PANEL
Anion gap: 12 (ref 5–15)
BUN: 29 mg/dL — ABNORMAL HIGH (ref 6–20)
CO2: 28 mmol/L (ref 22–32)
Calcium: 9.2 mg/dL (ref 8.9–10.3)
Chloride: 103 mmol/L (ref 98–111)
Creatinine, Ser: 0.87 mg/dL (ref 0.44–1.00)
GFR calc Af Amer: >60 mL/min (ref >60)
GFR calc non Af Amer: >60 mL/min (ref >60)
Glucose, Bld: 176 mg/dL — ABNORMAL HIGH (ref 70–99)
Potassium: 4.4 mmol/L (ref 3.5–5.1)
Sodium: 143 mmol/L (ref 135–145)

## 2019-05-22 LAB — CBC
HCT: 40.3 % (ref 36.0–46.0)
Hemoglobin: 12.1 g/dL (ref 12.0–15.0)
MCH: 24.9 pg — ABNORMAL LOW (ref 26.0–34.0)
MCHC: 30 g/dL (ref 30.0–36.0)
MCV: 82.9 fL (ref 80.0–100.0)
Platelets: 288 10*3/uL (ref 150–400)
RBC: 4.86 MIL/uL (ref 3.87–5.11)
RDW: 16.4 % — ABNORMAL HIGH (ref 11.5–15.5)
WBC: 7.8 10*3/uL (ref 4.0–10.5)
nRBC: 0 % (ref 0.0–0.2)

## 2019-05-22 LAB — SARS CORONAVIRUS 2 BY RT PCR (HOSPITAL ORDER, PERFORMED IN ~~LOC~~ HOSPITAL LAB): SARS Coronavirus 2: NEGATIVE

## 2019-05-22 NOTE — Telephone Encounter (Signed)
Patient informed that scale is available for pick up.

## 2019-05-23 ENCOUNTER — Telehealth: Payer: Self-pay | Admitting: *Deleted

## 2019-05-23 NOTE — Telephone Encounter (Signed)
Pt contacted pre-catheterization scheduled at [facility] for: Thursday May 24, 2019 10:30 AM Verified arrival time and place: [place] Entrance A at: 8:30 AM  Covid-19 test date: 05/21/29  No solid food after midnight prior to cath, clear liquids until 5 AM day of procedure. Contrast allergy: no  Hold: Insulin-AM of procedure. HCTZ-AM of procedure.  AM meds can be  taken pre-cath with sip of water including: ASA 81 mg Brilinta 90 mg   Confirmed patient has responsible person to drive home post procedure and observe 24 hours after arriving home: yes  Due to Covid-19 pandemic no visitors are allowed in the hospital (unless cognitive impairment). Their designated party will be called when their procedure is over for an update and to arrange pick up.  Patients are required to wear a mask when they enter the hospital.      COVID-19 Pre-Screening Questions:  . In the past 7 to 10 days have you had a cough,  shortness of breath, headache, congestion, fever (100 or greater) body aches, chills, sore throat, or sudden loss of taste or sense of smell? no . Have you been around anyone with known Covid 19? no . Have you been around anyone who is awaiting Covid 19 test results in the past 7 to 10 days? no . Have you been around anyone who has been exposed to Covid 19, or has mentioned symptoms of Covid 19 within the past 7 to 10 days? no  I reviewed procedure instructions/mask/visitor/Covid-19 screening questions with patient, she verbalized understanding, thanked me for call.

## 2019-05-24 ENCOUNTER — Encounter (HOSPITAL_COMMUNITY): Payer: Self-pay | Admitting: Cardiovascular Disease

## 2019-05-24 ENCOUNTER — Ambulatory Visit (HOSPITAL_COMMUNITY)
Admission: RE | Admit: 2019-05-24 | Discharge: 2019-05-25 | Disposition: A | Discharge status: Home / Self Care | Payer: PRIVATE HEALTH INSURANCE | Attending: Cardiovascular Disease | Admitting: Cardiovascular Disease

## 2019-05-24 ENCOUNTER — Other Ambulatory Visit: Payer: Self-pay

## 2019-05-24 ENCOUNTER — Encounter (HOSPITAL_COMMUNITY)
Admission: RE | Disposition: A | Discharge status: Home / Self Care | Payer: Self-pay | Source: Home / Self Care | Attending: Cardiovascular Disease

## 2019-05-24 DIAGNOSIS — E663 Overweight: Secondary | ICD-10-CM | POA: Insufficient documentation

## 2019-05-24 DIAGNOSIS — Z955 Presence of coronary angioplasty implant and graft: Secondary | ICD-10-CM | POA: Insufficient documentation

## 2019-05-24 DIAGNOSIS — Z79899 Other long term (current) drug therapy: Secondary | ICD-10-CM | POA: Diagnosis not present

## 2019-05-24 DIAGNOSIS — I252 Old myocardial infarction: Secondary | ICD-10-CM | POA: Insufficient documentation

## 2019-05-24 DIAGNOSIS — I509 Heart failure, unspecified: Secondary | ICD-10-CM | POA: Insufficient documentation

## 2019-05-24 DIAGNOSIS — M797 Fibromyalgia: Secondary | ICD-10-CM | POA: Insufficient documentation

## 2019-05-24 DIAGNOSIS — Z794 Long term (current) use of insulin: Secondary | ICD-10-CM | POA: Diagnosis not present

## 2019-05-24 DIAGNOSIS — E785 Hyperlipidemia, unspecified: Secondary | ICD-10-CM | POA: Diagnosis not present

## 2019-05-24 DIAGNOSIS — Z6838 Body mass index (BMI) 38.0-38.9, adult: Secondary | ICD-10-CM | POA: Diagnosis not present

## 2019-05-24 DIAGNOSIS — I200 Unstable angina: Secondary | ICD-10-CM

## 2019-05-24 DIAGNOSIS — I2510 Atherosclerotic heart disease of native coronary artery without angina pectoris: Secondary | ICD-10-CM | POA: Diagnosis present

## 2019-05-24 DIAGNOSIS — I10 Essential (primary) hypertension: Secondary | ICD-10-CM | POA: Diagnosis present

## 2019-05-24 DIAGNOSIS — K219 Gastro-esophageal reflux disease without esophagitis: Secondary | ICD-10-CM | POA: Diagnosis not present

## 2019-05-24 DIAGNOSIS — I110 Hypertensive heart disease with heart failure: Secondary | ICD-10-CM | POA: Diagnosis not present

## 2019-05-24 DIAGNOSIS — Z9851 Tubal ligation status: Secondary | ICD-10-CM | POA: Diagnosis not present

## 2019-05-24 DIAGNOSIS — E119 Type 2 diabetes mellitus without complications: Secondary | ICD-10-CM | POA: Diagnosis not present

## 2019-05-24 DIAGNOSIS — I25110 Atherosclerotic heart disease of native coronary artery with unstable angina pectoris: Secondary | ICD-10-CM

## 2019-05-24 HISTORY — PX: CORONARY STENT INTERVENTION: CATH118234

## 2019-05-24 HISTORY — DX: Atherosclerotic heart disease of native coronary artery without angina pectoris: I25.10

## 2019-05-24 HISTORY — PX: LEFT HEART CATH AND CORONARY ANGIOGRAPHY: CATH118249

## 2019-05-24 HISTORY — DX: Ischemic cardiomyopathy: I25.5

## 2019-05-24 LAB — GLUCOSE, CAPILLARY
Glucose-Capillary: 200 mg/dL — ABNORMAL HIGH (ref 70–99)
Glucose-Capillary: 170 mg/dL — ABNORMAL HIGH (ref 70–99)
Glucose-Capillary: 190 mg/dL — ABNORMAL HIGH (ref 70–99)
Glucose-Capillary: 237 mg/dL — ABNORMAL HIGH (ref 70–99)

## 2019-05-24 LAB — POCT ACTIVATED CLOTTING TIME
Activated Clotting Time: 257 seconds
Activated Clotting Time: 274 seconds

## 2019-05-24 SURGERY — LEFT HEART CATH AND CORONARY ANGIOGRAPHY
Anesthesia: LOCAL

## 2019-05-24 MED ORDER — ONDANSETRON HCL 4 MG/2ML IJ SOLN
4.0000 mg | Freq: Four times a day (QID) | INTRAMUSCULAR | Status: DC | PRN
  Administered 2019-05-24: 4 mg via INTRAVENOUS
  Filled 2019-05-24: qty 2

## 2019-05-24 MED ORDER — NITROGLYCERIN 1 MG/10 ML FOR IR/CATH LAB
INTRA_ARTERIAL | Status: AC
  Filled 2019-05-24: qty 10

## 2019-05-24 MED ORDER — FENTANYL CITRATE (PF) 100 MCG/2ML IJ SOLN
INTRAMUSCULAR | Status: DC | PRN
  Administered 2019-05-24 (×2): 25 ug via INTRAVENOUS

## 2019-05-24 MED ORDER — LIDOCAINE HCL (PF) 1 % IJ SOLN
INTRAMUSCULAR | Status: AC
  Filled 2019-05-24: qty 30

## 2019-05-24 MED ORDER — SODIUM CHLORIDE 0.9 % IV SOLN: 250.0000 mL | INTRAVENOUS | Status: DC | PRN

## 2019-05-24 MED ORDER — LABETALOL HCL 5 MG/ML IV SOLN: 10.0000 mg | INTRAVENOUS | Status: AC | PRN

## 2019-05-24 MED ORDER — ATORVASTATIN CALCIUM 80 MG PO TABS
80.0000 mg | ORAL_TABLET | Freq: Every day | ORAL | Status: DC
  Administered 2019-05-24: 80 mg via ORAL
  Filled 2019-05-24: qty 1

## 2019-05-24 MED ORDER — MIDAZOLAM HCL 2 MG/2ML IJ SOLN
INTRAMUSCULAR | Status: DC | PRN
  Administered 2019-05-24: 1 mg via INTRAVENOUS

## 2019-05-24 MED ORDER — HEPARIN SODIUM (PORCINE) 1000 UNIT/ML IJ SOLN
INTRAMUSCULAR | Status: DC | PRN
  Administered 2019-05-24: 2500 [IU] via INTRAVENOUS
  Administered 2019-05-24: 3500 [IU] via INTRAVENOUS
  Administered 2019-05-24: 5500 [IU] via INTRAVENOUS

## 2019-05-24 MED ORDER — VERAPAMIL HCL 2.5 MG/ML IV SOLN
INTRAVENOUS | Status: AC
  Filled 2019-05-24: qty 2

## 2019-05-24 MED ORDER — MORPHINE SULFATE (PF) 2 MG/ML IV SOLN: 2.0000 mg | INTRAVENOUS | Status: DC | PRN

## 2019-05-24 MED ORDER — TICAGRELOR 90 MG PO TABS: 90.0000 mg | ORAL_TABLET | Freq: Two times a day (BID) | ORAL | Status: DC

## 2019-05-24 MED ORDER — CARVEDILOL 6.25 MG PO TABS
6.2500 mg | ORAL_TABLET | Freq: Two times a day (BID) | ORAL | Status: DC
  Administered 2019-05-24 – 2019-05-25 (×2): 6.25 mg via ORAL
  Filled 2019-05-24 (×2): qty 1

## 2019-05-24 MED ORDER — ASPIRIN EC 81 MG PO TBEC
81.0000 mg | DELAYED_RELEASE_TABLET | Freq: Every day | ORAL | Status: DC
  Administered 2019-05-25: 81 mg via ORAL
  Filled 2019-05-24 (×3): qty 1

## 2019-05-24 MED ORDER — SODIUM CHLORIDE 0.9% FLUSH: 3.0000 mL | INTRAVENOUS | Status: DC | PRN

## 2019-05-24 MED ORDER — INSULIN ASPART 100 UNIT/ML ~~LOC~~ SOLN
0.0000 [IU] | Freq: Three times a day (TID) | SUBCUTANEOUS | Status: DC
  Administered 2019-05-24: 3 [IU] via SUBCUTANEOUS
  Administered 2019-05-25: 8 [IU] via SUBCUTANEOUS

## 2019-05-24 MED ORDER — TICAGRELOR 90 MG PO TABS
ORAL_TABLET | ORAL | Status: DC | PRN
  Administered 2019-05-24: 90 mg via ORAL

## 2019-05-24 MED ORDER — HYDRALAZINE HCL 20 MG/ML IJ SOLN
10.0000 mg | INTRAMUSCULAR | Status: AC | PRN
  Administered 2019-05-24: 10 mg via INTRAVENOUS
  Filled 2019-05-24: qty 1

## 2019-05-24 MED ORDER — HEPARIN SODIUM (PORCINE) 1000 UNIT/ML IJ SOLN
INTRAMUSCULAR | Status: AC
  Filled 2019-05-24: qty 1

## 2019-05-24 MED ORDER — SODIUM CHLORIDE 0.9% FLUSH
3.0000 mL | Freq: Two times a day (BID) | INTRAVENOUS | Status: DC
  Administered 2019-05-24 (×2): 3 mL via INTRAVENOUS

## 2019-05-24 MED ORDER — HEPARIN (PORCINE) IN NACL 1000-0.9 UT/500ML-% IV SOLN
INTRAVENOUS | Status: AC
  Filled 2019-05-24: qty 1000

## 2019-05-24 MED ORDER — PANTOPRAZOLE SODIUM 40 MG PO TBEC
40.0000 mg | DELAYED_RELEASE_TABLET | Freq: Every day | ORAL | Status: DC
  Administered 2019-05-24 – 2019-05-25 (×2): 40 mg via ORAL
  Filled 2019-05-24 (×2): qty 1

## 2019-05-24 MED ORDER — ASPIRIN 81 MG PO CHEW: 81.0000 mg | CHEWABLE_TABLET | Freq: Every day | ORAL | Status: DC

## 2019-05-24 MED ORDER — SODIUM CHLORIDE 0.9 % IV SOLN
INTRAVENOUS | Status: AC
  Administered 2019-05-24: 12:00:00 via INTRAVENOUS

## 2019-05-24 MED ORDER — VERAPAMIL HCL 2.5 MG/ML IV SOLN
INTRA_ARTERIAL | Status: DC | PRN
  Administered 2019-05-24: 15 mL via INTRA_ARTERIAL
  Administered 2019-05-24 (×2): 7 mL via INTRA_ARTERIAL

## 2019-05-24 MED ORDER — SODIUM CHLORIDE 0.9 % IV SOLN
INTRAVENOUS | Status: DC
  Administered 2019-05-24: 09:00:00 via INTRAVENOUS

## 2019-05-24 MED ORDER — SODIUM CHLORIDE 0.9% FLUSH: 3.0000 mL | Freq: Two times a day (BID) | INTRAVENOUS | Status: DC

## 2019-05-24 MED ORDER — ACETAMINOPHEN 325 MG PO TABS: 650.0000 mg | ORAL_TABLET | ORAL | Status: DC | PRN

## 2019-05-24 MED ORDER — HYDROCHLOROTHIAZIDE 12.5 MG PO CAPS
12.5000 mg | ORAL_CAPSULE | Freq: Every day | ORAL | Status: DC
  Administered 2019-05-25: 12.5 mg via ORAL
  Filled 2019-05-24: qty 1

## 2019-05-24 MED ORDER — DULOXETINE HCL 20 MG PO CPEP
40.0000 mg | ORAL_CAPSULE | Freq: Every day | ORAL | Status: DC
  Administered 2019-05-24: 40 mg via ORAL
  Filled 2019-05-24: qty 2

## 2019-05-24 MED ORDER — MIDAZOLAM HCL 2 MG/2ML IJ SOLN
INTRAMUSCULAR | Status: AC
  Filled 2019-05-24: qty 2

## 2019-05-24 MED ORDER — LISINOPRIL 10 MG PO TABS: 10.0000 mg | ORAL_TABLET | Freq: Every evening | ORAL | Status: DC

## 2019-05-24 MED ORDER — IOHEXOL 350 MG/ML SOLN
INTRAVENOUS | Status: DC | PRN
  Administered 2019-05-24: 11:00:00 175 mL via INTRAVENOUS

## 2019-05-24 MED ORDER — TICAGRELOR 90 MG PO TABS
90.0000 mg | ORAL_TABLET | Freq: Two times a day (BID) | ORAL | Status: DC
  Administered 2019-05-24 – 2019-05-25 (×2): 90 mg via ORAL
  Filled 2019-05-24 (×2): qty 1

## 2019-05-24 MED ORDER — ASPIRIN 81 MG PO CHEW
81.0000 mg | CHEWABLE_TABLET | ORAL | Status: AC
  Administered 2019-05-24: 81 mg via ORAL
  Filled 2019-05-24: qty 1

## 2019-05-24 MED ORDER — HEPARIN (PORCINE) IN NACL 1000-0.9 UT/500ML-% IV SOLN
INTRAVENOUS | Status: DC | PRN
  Administered 2019-05-24 (×2): 500 mL

## 2019-05-24 MED ORDER — FENTANYL CITRATE (PF) 100 MCG/2ML IJ SOLN
INTRAMUSCULAR | Status: AC
  Filled 2019-05-24: qty 2

## 2019-05-24 MED ORDER — LIDOCAINE HCL (PF) 1 % IJ SOLN
INTRAMUSCULAR | Status: DC | PRN
  Administered 2019-05-24: 2 mL

## 2019-05-24 MED ORDER — ATORVASTATIN CALCIUM 80 MG PO TABS: 80.0000 mg | ORAL_TABLET | Freq: Every day | ORAL | Status: DC

## 2019-05-24 MED ORDER — ACETAMINOPHEN 325 MG PO TABS: 650.0000 mg | ORAL_TABLET | Freq: Four times a day (QID) | ORAL | Status: DC | PRN

## 2019-05-24 SURGICAL SUPPLY — 23 items
BALLN SAPPHIRE 2.0X12 (BALLOONS) ×2
BALLN ~~LOC~~ EMERGE MR 2.5X12 (BALLOONS) ×2
BALLOON SAPPHIRE 2.0X12 (BALLOONS) IMPLANT
BALLOON ~~LOC~~ EMERGE MR 2.5X12 (BALLOONS) IMPLANT
CATH INFINITI 5FR ANG PIGTAIL (CATHETERS) ×1 IMPLANT
CATH LAUNCHER 5F EBU3.0 (CATHETERS) IMPLANT
CATH OPTITORQUE TIG 4.0 5F (CATHETERS) ×1 IMPLANT
CATH VISTA GUIDE 6FR XBLAD3.0 (CATHETERS) ×1 IMPLANT
CATH VISTA GUIDE 6FR XBLAD3.5 (CATHETERS) ×1 IMPLANT
CATHETER LAUNCHER 5F EBU3.0 (CATHETERS) ×2
DEVICE RAD COMP TR BAND LRG (VASCULAR PRODUCTS) ×1 IMPLANT
GLIDESHEATH SLEND A-KIT 6F 22G (SHEATH) ×1 IMPLANT
GUIDEWIRE INQWIRE 1.5J.035X260 (WIRE) IMPLANT
INQWIRE 1.5J .035X260CM (WIRE) ×2
KIT ENCORE 26 ADVANTAGE (KITS) ×1 IMPLANT
KIT HEART LEFT (KITS) ×2 IMPLANT
PACK CARDIAC CATHETERIZATION (CUSTOM PROCEDURE TRAY) ×2 IMPLANT
STENT SYNERGY DES 2.25X28 (Permanent Stent) ×1 IMPLANT
SYR MEDRAD MARK 7 150ML (SYRINGE) ×2 IMPLANT
TRANSDUCER W/STOPCOCK (MISCELLANEOUS) ×2 IMPLANT
TUBING CIL FLEX 10 FLL-RA (TUBING) ×2 IMPLANT
WIRE ASAHI PROWATER 180CM (WIRE) ×1 IMPLANT
WIRE HI TORQ VERSACORE-J 145CM (WIRE) ×1 IMPLANT

## 2019-05-24 NOTE — Progress Notes (Addendum)
CARDIAC REHAB PHASE I   Stent education completed with pt. Pt educated on importance of ASA, Brilinta, and NTG. Pt given MI book, heart healthy and diabetic diets. Reviewed restrictions and exercise guidelines. Talked with pt about daily weights, and monitoring salt and water intake. Spoke with pt at length about stress management and taking care of herself. Pt tearful. Emotional support provided. Will refer to CRP II [place], pt agreeable to Virtual Cardiac Rehab.    Pt is interested in participating in Virtual Cardiac Rehab. Pt advised that Virtual Cardiac Rehab is provided at no cost to the patient.  Checklist:  1. Pt has smart device  ie smartphone and/or ipad for downloading an app  Yes 2. Reliable internet/wifi service    Yes 3. Understands how to use their smartphone and navigate within an app.  Yes   Reviewed with pt the scheduling process for virtual cardiac rehab.  Pt verbalized understanding.   [id number] Rufina Falco, RN BSN 05/24/2019 1:55 PM

## 2019-05-24 NOTE — TOC Benefit Eligibility Note (Signed)
Transition of Care [facility]) Benefit Eligibility Note    Patient Details  Name: Amy Snow MRN: [id number] Date of Birth: [birth year]   Medication/Dose: Kary Kos  90 MG BID  Covered?: Yes  Tier: 3 Drug  Prescription Coverage Preferred Pharmacy: [facility] [place] with Person/Company/Phone Number:: URSULA  @ [employer] # [phone]  Co-Pay: $405.63  Prior Approval: No  Deductible: Unmet       [place] Phone Number: 05/24/2019, 4:32 PM

## 2019-05-24 NOTE — Plan of Care (Signed)
  Problem: Education: Goal: Knowledge of General Education information will improve Description: Including pain rating scale, medication(s)/side effects and non-pharmacologic comfort measures Outcome: Progressing   Problem: Clinical Measurements: Goal: Respiratory complications will improve Outcome: Progressing Goal: Cardiovascular complication will be avoided Outcome: Progressing   Problem: Education: Goal: Understanding of CV disease, CV risk reduction, and recovery process will improve Outcome: Progressing   Problem: Cardiovascular: Goal: Ability to achieve and maintain adequate cardiovascular perfusion will improve Outcome: Progressing

## 2019-05-24 NOTE — Progress Notes (Signed)
Rt wrist and hand swollen and tight with TR band at 11 cc. 2 cc removed at this time with no obvious s/s bleed or hematoma. Sat monitor probe on Rt thumb with sats 92 - 96%. Pt is on Room Air. Dr Gwenlyn Found notified of swelling of Rt hand and wrist.

## 2019-05-24 NOTE — Interval H&P Note (Signed)
Cath Lab Visit (complete for each Cath Lab visit)  Clinical Evaluation Leading to the Procedure:   ACS: No.  Non-ACS:    Anginal Classification: CCS II  Anti-ischemic medical therapy: Minimal Therapy (1 class of medications)  Non-Invasive Test Results: No non-invasive testing performed  Prior CABG: No previous CABG      History and Physical Interval Note:  05/24/2019 9:40 AM  Amy Snow  has presented today for surgery, with the diagnosis of unstable angina.  The various methods of treatment have been discussed with the patient and family. After consideration of risks, benefits and other options for treatment, the patient has consented to  Procedure(s): LEFT HEART CATH AND CORONARY ANGIOGRAPHY (N/A) as a surgical intervention.  The patient's history has been reviewed, patient examined, no change in status, stable for surgery.  I have reviewed the patient's chart and labs.  Questions were answered to the patient's satisfaction.     Quay Burow

## 2019-05-25 ENCOUNTER — Encounter (HOSPITAL_COMMUNITY): Payer: Self-pay | Admitting: Physician Assistant

## 2019-05-25 DIAGNOSIS — I200 Unstable angina: Secondary | ICD-10-CM

## 2019-05-25 DIAGNOSIS — I252 Old myocardial infarction: Secondary | ICD-10-CM | POA: Diagnosis not present

## 2019-05-25 DIAGNOSIS — I110 Hypertensive heart disease with heart failure: Secondary | ICD-10-CM | POA: Diagnosis not present

## 2019-05-25 DIAGNOSIS — E785 Hyperlipidemia, unspecified: Secondary | ICD-10-CM | POA: Diagnosis not present

## 2019-05-25 DIAGNOSIS — I509 Heart failure, unspecified: Secondary | ICD-10-CM | POA: Diagnosis not present

## 2019-05-25 DIAGNOSIS — Z9851 Tubal ligation status: Secondary | ICD-10-CM | POA: Diagnosis not present

## 2019-05-25 DIAGNOSIS — I25110 Atherosclerotic heart disease of native coronary artery with unstable angina pectoris: Secondary | ICD-10-CM | POA: Diagnosis not present

## 2019-05-25 DIAGNOSIS — Z955 Presence of coronary angioplasty implant and graft: Secondary | ICD-10-CM | POA: Diagnosis not present

## 2019-05-25 DIAGNOSIS — I10 Essential (primary) hypertension: Secondary | ICD-10-CM | POA: Diagnosis not present

## 2019-05-25 DIAGNOSIS — Z79899 Other long term (current) drug therapy: Secondary | ICD-10-CM | POA: Diagnosis not present

## 2019-05-25 DIAGNOSIS — Z794 Long term (current) use of insulin: Secondary | ICD-10-CM | POA: Diagnosis not present

## 2019-05-25 DIAGNOSIS — E782 Mixed hyperlipidemia: Secondary | ICD-10-CM

## 2019-05-25 DIAGNOSIS — M797 Fibromyalgia: Secondary | ICD-10-CM | POA: Diagnosis not present

## 2019-05-25 DIAGNOSIS — K219 Gastro-esophageal reflux disease without esophagitis: Secondary | ICD-10-CM | POA: Diagnosis not present

## 2019-05-25 DIAGNOSIS — E119 Type 2 diabetes mellitus without complications: Secondary | ICD-10-CM | POA: Diagnosis not present

## 2019-05-25 DIAGNOSIS — E663 Overweight: Secondary | ICD-10-CM | POA: Diagnosis not present

## 2019-05-25 DIAGNOSIS — Z6838 Body mass index (BMI) 38.0-38.9, adult: Secondary | ICD-10-CM | POA: Diagnosis not present

## 2019-05-25 LAB — CBC
HCT: 36.4 % (ref 36.0–46.0)
Hemoglobin: 11.5 g/dL — ABNORMAL LOW (ref 12.0–15.0)
MCH: 25.1 pg — ABNORMAL LOW (ref 26.0–34.0)
MCHC: 31.6 g/dL (ref 30.0–36.0)
MCV: 79.3 fL — ABNORMAL LOW (ref 80.0–100.0)
Platelets: 210 10*3/uL (ref 150–400)
RBC: 4.59 MIL/uL (ref 3.87–5.11)
RDW: 16.1 % — ABNORMAL HIGH (ref 11.5–15.5)
WBC: 6.2 10*3/uL (ref 4.0–10.5)
nRBC: 0 % (ref 0.0–0.2)

## 2019-05-25 LAB — BASIC METABOLIC PANEL
Anion gap: 12 (ref 5–15)
BUN: 22 mg/dL — ABNORMAL HIGH (ref 6–20)
CO2: 23 mmol/L (ref 22–32)
Calcium: 9.1 mg/dL (ref 8.9–10.3)
Chloride: 100 mmol/L (ref 98–111)
Creatinine, Ser: 0.95 mg/dL (ref 0.44–1.00)
GFR calc Af Amer: >60 mL/min (ref >60)
GFR calc non Af Amer: >60 mL/min (ref >60)
Glucose, Bld: 241 mg/dL — ABNORMAL HIGH (ref 70–99)
Potassium: 4.1 mmol/L (ref 3.5–5.1)
Sodium: 135 mmol/L (ref 135–145)

## 2019-05-25 LAB — GLUCOSE, CAPILLARY: Glucose-Capillary: 252 mg/dL — ABNORMAL HIGH (ref 70–99)

## 2019-05-25 MED ORDER — CLOPIDOGREL BISULFATE 75 MG PO TABS: ORAL_TABLET | ORAL | 3 refills | Status: DC

## 2019-05-25 MED ORDER — ACETAMINOPHEN 500 MG PO TABS: 500.0000 mg | ORAL_TABLET | Freq: Four times a day (QID) | ORAL | Status: DC | PRN

## 2019-05-25 MED ORDER — PANTOPRAZOLE SODIUM 40 MG PO TBEC: 40.0000 mg | DELAYED_RELEASE_TABLET | Freq: Every day | ORAL | 1 refills | Status: DC

## 2019-05-25 MED FILL — PANTOPRAZOLE SOD DR 40 MG T: 40 | 30 days supply | Qty: 30 | Fill #0

## 2019-05-25 MED FILL — CLOPIDOGREL 75 MG TABLET: 75 | 85 days supply | Qty: 90 | Fill #0

## 2019-05-25 NOTE — Discharge Summary (Addendum)
Discharge Summary    Patient ID: Amy Snow MRN: [id number]; DOB: [birth year]  Admit date: 05/24/2019 Discharge date: 05/25/2019  Primary Care Provider: Wyatt Haste, NP  Primary Cardiologist: Quay Burow, MD  Primary Electrophysiologist:  None  Discharge Diagnoses    Principal Problem:   Unstable angina [facility]) Active Problems:   Diabetes mellitus ([place])   Essential hypertension   Hyperlipidemia   CAD in native artery   Allergies No Known Allergies  Diagnostic Studies/Procedures    Cath 05/24/19  IMPRESSION: Successful approximately PCI and drug-eluting stenting of a long high-grade lesion probably responsible for her accelerated symptoms over the last several months.  The circumflex was a relatively small vessel with long segmental disease.  The right was a large vessel with a patent PDA stent and some moderate disease in the mid PDA and PLA.  LV function was preserved although there was mild inferoapical hypokinesia with a visual EF estimated at approximate 50%.  Patient will need uninterrupted dual antiplatelet platelet therapy for 1 year along with risk factor modification.  She will be gently hydrated overnight and discharged home in the morning.  Conclusion    Prox LAD to Mid LAD lesion is 95% stenosed.  Mid LAD lesion is 90% stenosed.  Prox Cx lesion is 95% stenosed.  Prox Cx to Mid Cx lesion is 90% stenosed.  Prox RCA lesion is 30% stenosed.  2nd RPL lesion is 80% stenosed.  RPDA-2 lesion is 80% stenosed.  Previously placed RPDA-1 stent (unknown type) is widely patent.  A drug-eluting stent was successfully placed using a STENT SYNERGY DES 2.25X28.  Post intervention, there is a 0% residual stenosis.  Post intervention, there is a 0% residual stenosis.  There is mild left ventricular systolic dysfunction.  LV end diastolic pressure is normal.  The left ventricular ejection fraction is 50-55% by visual estimate.    _____________   History  of Present Illness     Ms. Amy Snow is a 53 y/o F with history of CAD s/p STEMI 2016 s/p DES x 2 PDA, ICM (<25% at time of MI 2016 with subsequent resolution), HTN, HLD, GERD, DM, fibromyalgia, mild chronic appearing anemia, anxiety, depression who presented to the hospital for planned cath. She was seen in the office on 05/15/19 by Rosaria Ferries PA-C reporting increasing chest pain and DOE concerning for unstable angina. She had not been seen since 2016 and had not been taking meds reliably. Her blood pressure was also elevated. Her EKG showed SR, HR 91, diffuse T wave flattening. Her symptoms were felt concerning for unstable angina therefore cardiac cath was recommended. She was also felt to have possible acute on chronic CHF and given a short term course of Lasix. OP cardiac cath was arranged.  Hospital Course     She was brought in for this procedure yesterday with full results above. She ultimately underwent DES placement of a long high grade lesion in her LAD. Per cath note, "The circumflex was a relatively small vessel with long segmental disease.  The right was a large vessel with a patent PDA stent and some moderate disease in the mid PDA and PLA.  LV function was preserved although there was mild inferoapical hypokinesia with a visual EF estimated at approximate 50%.  Patient will need uninterrupted dual antiplatelet platelet therapy for 1 year along with risk factor modification." The residual disease was treated medically for now. Her LVEDP was normal so she was not felt to require further standing diuretics at discharge.  She had Brilinta at home but it was expired. Due to concerns of cost/compliance, Dr. Gwenlyn Found recommended to switch her to Plavix taking a 300mg  loading dose tonight then 75mg  daily tomorrow. Dr. Gwenlyn Found has seen and examined the patient today and feels she is stable for discharge. He feels she may return to work in 1 week. _____________  Discharge Vitals Blood pressure 126/87,  pulse 78, temperature 98.1 F (36.7 C), temperature source Oral, resp. rate 20, height 4\' 11"  (1.499 m), weight 84.1 kg, SpO2 98 %.  Filed Weights   05/24/19 0853 05/25/19 0639  Weight: 86.2 kg 84.1 kg    Labs & Radiologic Studies    CBC Recent Labs    05/25/19 0653  WBC 6.2  HGB 11.5*  HCT 36.4  MCV 79.3*  PLT 295   Basic Metabolic Panel Recent Labs    05/25/19 0653  NA 135  K 4.1  CL 100  CO2 23  GLUCOSE 241*  BUN 22*  CREATININE 0.95  CALCIUM 9.1   _____________  No results found. Disposition   Pt is being discharged home today in good condition.  Follow-up Plans & Appointments    Follow-up Information    Barrett, Evelene Croon, PA-C Follow up.   Specialties: Cardiology, Radiology Why: [facility] - we will keep the appointment with Rosaria Ferries on 8/3 at 1pm that was scheduled before your hospital stay. It appears the office has been trying to reach you about rescheduling your echo, so please call them on Monday when they reopen. Contact information: [place] [address] [phone]          Discharge Instructions    Amb Referral to Cardiac Rehabilitation   Complete by: As directed    [email or web address]   Diagnosis: Coronary Stents   After initial evaluation and assessments completed: Virtual Based Care may be provided alone or in conjunction with Phase 2 Cardiac Rehab based on patient barriers.: Yes   Diet - low sodium heart healthy   Complete by: As directed    Discharge instructions   Complete by: As directed    Dr. Gwenlyn Found recommends you go home on 2 blood thinners: - aspirin - you will take this daily - Plavix/clopidogrel - tonight (05/25/19), you will take 4 tablets at bedtime, tomorrow morning you will start the regular dose of 1 tablet daily   - If you notice any bleeding such as blood in stool, black tarry stools, blood in urine, nosebleeds or any other unusual bleeding, call your doctor immediately. It is not normal to  have this kind of bleeding while on a blood thinner and usually indicates there is an underlying problem with one of your body systems that needs to be checked out.   Your fluid pill Lasix (furosemide) and potassium supplements were stopped since your fluid status looked good by your heart cath. Please see medicine list otherwise for those continued.  Your home medicine list included two medicines that both contain Tylenol - acetaminophen. Just be aware of the content of acetaminophen when taking these medicines as you should not take more than 1000mg  of acetaminophen every 6 hours, and no more than 4000mg  total in a day.   Some studies suggest Prilosec/Omeprazole interacts with Plavix/clopidogrel. We changed your Prilosec/Omeprazole to the equivalent dose of Protonix for less chance of interaction.   Increase activity slowly   Complete by: As directed    No driving for 2 days. No lifting over 5 lbs for 1 week. No  sexual activity for 1 week. You may return to work in 1 week if feeling well. Keep procedure site clean & dry. If you notice increased pain, swelling, bleeding or pus, call/return!  You may shower, but no soaking baths/hot tubs/pools for 1 week.      Discharge Medications   Allergies as of 05/25/2019   No Known Allergies     Medication List    STOP taking these medications   furosemide 20 MG tablet Commonly known as: LASIX   omeprazole 20 MG capsule Commonly known as: PRILOSEC Replaced by: pantoprazole 40 MG tablet   potassium chloride 10 MEQ tablet Commonly known as: K-DUR   ticagrelor 90 MG Tabs tablet Commonly known as: BRILINTA     TAKE these medications   acetaminophen 650 MG CR tablet Commonly known as: TYLENOL Take 1,300 mg by mouth 2 (two) times a day. What changed: Another medication with the same name was changed. Make sure you understand how and when to take each.   acetaminophen 500 MG tablet Commonly known as: TYLENOL Take 1-2 tablets (500-1,000 mg  total) by mouth every 6 (six) hours as needed for moderate pain. What changed:   how much to take  when to take this   aspirin 81 MG chewable tablet Chew 1 tablet (81 mg total) by mouth daily.   atorvastatin 80 MG tablet Commonly known as: LIPITOR Take 1 tablet (80 mg total) by mouth daily at 6 PM.   carvedilol 6.25 MG tablet Commonly known as: COREG Take 1 tablet (6.25 mg total) by mouth 2 (two) times daily with a meal.   clopidogrel 75 MG tablet Commonly known as: Plavix Tonight take 4 tablets (300mg  total) by mouth, then tomorrow begin taking only 1 tablet (75mg ) daily.   DULoxetine 20 MG capsule Commonly known as: CYMBALTA Take 40 mg by mouth at bedtime.   hydrochlorothiazide 12.5 MG capsule Commonly known as: MICROZIDE Take 12.5 mg by mouth daily.   insulin NPH-regular Human (70-30) 100 UNIT/ML injection Inject 80-100 Units into the skin 2 (two) times a day. Sliding Scale 200-300=100 units  Less than 100=80   lisinopril 10 MG tablet Commonly known as: ZESTRIL Take 10 mg by mouth every evening.   nitroGLYCERIN 0.4 MG SL tablet Commonly known as: NITROSTAT Place 1 tablet (0.4 mg total) under the tongue every 5 (five) minutes x 3 doses as needed for chest pain (if no relief after 3rd dose, proceed to the ED for an evaluation or call 911).   pantoprazole 40 MG tablet Commonly known as: PROTONIX Take 1 tablet (40 mg total) by mouth daily. Start taking on: May 26, 2019 Replaces: omeprazole 20 MG capsule   Vitamin D3 1.25 MG (50000 UT) Caps Take 50,000 Units by mouth every Wednesday. In the morning.         Outstanding Labs/Studies   N/A  Duration of Discharge Encounter   Greater than 30 minutes including physician time.  Signed, Charlie Pitter, PA-C 05/25/2019, 11:05 AM   Agree with findings by Melina Copa PA-C  Ms. Scronce is stable for discharge postop day #1 outpatient cardiac catheterization, PCI and drug-eluting stenting of the proximal LAD via the  right radial approach.  She is stable and asymptomatic.  She.denies chest pain.  We will begin her on Plavix instead of her home medication of Brilinta for cost and compliance reasons.  In addition, since she has difficulty in transportation given her poor vision we will arrange for her to follow-up with 1  of our partners in the [facility]  office.   Lorretta Harp, M.D., [place], [facility], [place] [address] [place], [place]  [address]  [phone] 05/27/2019 1:36 PM

## 2019-05-25 NOTE — Plan of Care (Signed)
  Problem: Clinical Measurements: Goal: Diagnostic test results will improve Outcome: Progressing Goal: Cardiovascular complication will be avoided Outcome: Progressing   Problem: Pain Managment: Goal: General experience of comfort will improve Outcome: Progressing   Problem: Safety: Goal: Ability to remain free from injury will improve Outcome: Progressing   Problem: Activity: Goal: Ability to return to baseline activity level will improve Outcome: Progressing   Problem: Cardiovascular: Goal: Vascular access site(s) Level 0-1 will be maintained Outcome: Progressing

## 2019-05-25 NOTE — Progress Notes (Signed)
Progress Note  Patient Name: Amy Snow Date of Encounter: 05/25/2019  Primary Cardiologist: Quay Burow, MD   Subjective   Postop day #1 proximal LAD stenting with a synergy drug-eluting stent.  She was having some swelling in her right hand yesterday with this is resolved.  She denies chest pain or shortness of breath and is stable for discharge.  Inpatient Medications    Scheduled Meds: . aspirin EC  81 mg Oral Daily  . atorvastatin  80 mg Oral q1800  . carvedilol  6.25 mg Oral BID WC  . DULoxetine  40 mg Oral QHS  . hydrochlorothiazide  12.5 mg Oral Daily  . insulin aspart  0-15 Units Subcutaneous TID WC  . lisinopril  10 mg Oral QPM  . pantoprazole  40 mg Oral Daily  . sodium chloride flush  3 mL Intravenous Q12H  . ticagrelor  90 mg Oral BID   Continuous Infusions: . sodium chloride     PRN Meds: sodium chloride, acetaminophen, morphine injection, ondansetron (ZOFRAN) IV, sodium chloride flush   Vital Signs    Vitals:   05/24/19 1511 05/24/19 2139 05/25/19 0053 05/25/19 0639  BP: (!) 142/67 (!) 147/78 (!) 120/56 126/87  Pulse: 79 76  (!) 101  Resp: (!) 22 20  20   Temp:  98.3 F (36.8 C)  98.1 F (36.7 C)  TempSrc:  Oral  Oral  SpO2: 96% 98% 96% 98%  Weight:    84.1 kg  Height:        Intake/Output Summary (Last 24 hours) at 05/25/2019 0844 Last data filed at 05/25/2019 0400 Gross per 24 hour  Intake 0 ml  Output -  Net 0 ml   Last 3 Weights 05/25/2019 05/24/2019 05/15/2019  Weight (lbs) 185 lb 4.8 oz 190 lb 190 lb 3.2 oz  Weight (kg) 84.052 kg 86.183 kg 86.274 kg  Some encounter information is confidential and restricted. Go to Review Flowsheets activity to see all data.      Telemetry    Normal sinus rhythm- Personally Reviewed  ECG    Normal sinus rhythm at 85 with poor R wave progression- Personally Reviewed  Physical Exam   GEN: No acute distress.   Neck: No JVD Cardiac: RRR, no murmurs, rubs, or gallops.  Respiratory: Clear to  auscultation bilaterally. GI: Soft, nontender, non-distended  MS: No edema; No deformity. Neuro:  Nonfocal  Psych: Normal affect  Extremities: Right radial puncture site appears stable.  There is no ecchymosis or tenderness in her forearm.  Labs    High Sensitivity Troponin:  No results for input(s): TROPONINIHS in the last 720 hours.    Cardiac EnzymesNo results for input(s): TROPONINI in the last 168 hours. No results for input(s): TROPIPOC in the last 168 hours.   Chemistry Recent Labs  Lab 05/22/19 0939 05/25/19 0653  NA 143 135  K 4.4 4.1  CL 103 100  CO2 28 23  GLUCOSE 176* 241*  BUN 29* 22*  CREATININE 0.87 0.95  CALCIUM 9.2 9.1  GFRNONAA >60 >60  GFRAA >60 >60  ANIONGAP 12 12     Hematology Recent Labs  Lab 05/22/19 0939 05/25/19 0653  WBC 7.8 6.2  RBC 4.86 4.59  HGB 12.1 11.5*  HCT 40.3 36.4  MCV 82.9 79.3*  MCH 24.9* 25.1*  MCHC 30.0 31.6  RDW 16.4* 16.1*  PLT 288 210    BNPNo results for input(s): BNP, PROBNP in the last 168 hours.   DDimer No results for input(s):  DDIMER in the last 168 hours.   Radiology    No results found.  Cardiac Studies     IMPRESSION: Successful approximately PCI and drug-eluting stenting of a long high-grade lesion probably responsible for her accelerated symptoms over the last several months.  The circumflex was a relatively small vessel with long segmental disease.  The right was a large vessel with a patent PDA stent and some moderate disease in the mid PDA and PLA.  LV function was preserved although there was mild inferoapical hypokinesia with a visual EF estimated at approximate 50%.  Patient will need uninterrupted dual antiplatelet platelet therapy for 1 year along with risk factor modification.  She will be gently hydrated overnight and discharged home in the morning.  Quay Burow. MD, [facility] 05/24/2019 11:24 AM  Diagrams  Diagnostic Dominance: Right  Intervention     Patient Profile     53  y.o. female moderately overweight Caucasian female with history of hypertension, hyperlipidemia, insulin-dependent diabetes and GERD.  She had inferior STEMI in 2016 treated with PCI and drug-eluting stenting of an occluded posterior descending coronary artery.  She did have moderate circumflex and LAD disease at that time which were treated medically.  LV function was severely reduced initially which recovered by 2D echo several months later.  A Myoview stress test showed no ischemia in the LAD or circumflex territory and therefore medical therapy was recommended.  I am not seen her back for 4 years because of insurance issues however she has had several months of progressive angina and dyspnea and presented yesterday for outpatient diagnostic coronary angiography.  Assessment & Plan    1: Coronary artery disease- history of CAD status post PDA PCI and drug-eluting stenting in 2016 with moderate LAD and circumflex disease at that time.  Because of progressive angina she underwent outpatient diagnostic radial coronary angiography yesterday revealing a widely patent PDA stent, severe diffuse nondominant circumflex disease and progression of disease in the proximal LAD which I stented using a synergy drug-eluting stent with an excellent result.  She can be discharged home this morning on Plavix instead of Brilinta for compliance and cost considerations after 300 mg loading dose.  2: Hyperlipidemia- history of hyperlipidemia.  She can be discharged home on high-dose Lipitor  3: Essential hypertension- okay to discharge on carvedilol and lisinopril.  Okay to discharge home this morning on aspirin and Plavix after 300 mg p.o. Plavix load.  She lives in [place] and has difficulty with driving and transportation given her poor eyesight from diabetes.  She can follow-up with Dr. Bronson Ing or [place] in the office.  For questions or updates, please contact [place] Please consult [email or web address] for  contact info under        Signed, Quay Burow, MD  05/25/2019, 8:44 AM

## 2019-06-07 ENCOUNTER — Other Ambulatory Visit: Payer: PRIVATE HEALTH INSURANCE

## 2019-06-07 ENCOUNTER — Telehealth: Payer: Self-pay | Admitting: *Deleted

## 2019-06-07 NOTE — Telephone Encounter (Signed)
Spoke with patient regarding LIPID clinic appointment with the Pharm D.  She wants to wait and speak with Rosaria Ferries, PA on 06/25/19 at her post cath visit regarding the need for this appointment.

## 2019-06-14 ENCOUNTER — Other Ambulatory Visit: Payer: Self-pay

## 2019-06-14 ENCOUNTER — Ambulatory Visit (INDEPENDENT_AMBULATORY_CARE_PROVIDER_SITE_OTHER): Payer: PRIVATE HEALTH INSURANCE

## 2019-06-14 DIAGNOSIS — I255 Ischemic cardiomyopathy: Secondary | ICD-10-CM

## 2019-06-25 ENCOUNTER — Ambulatory Visit: Payer: PRIVATE HEALTH INSURANCE | Admitting: Physician Assistant

## 2019-07-05 ENCOUNTER — Other Ambulatory Visit: Payer: Self-pay

## 2019-07-05 ENCOUNTER — Ambulatory Visit (INDEPENDENT_AMBULATORY_CARE_PROVIDER_SITE_OTHER): Payer: BC Managed Care – PPO | Admitting: Physician Assistant

## 2019-07-05 ENCOUNTER — Encounter: Payer: Self-pay | Admitting: Physician Assistant

## 2019-07-05 VITALS — BP 172/84 | HR 91 | Ht 59.0 in | Wt 192.6 lb

## 2019-07-05 DIAGNOSIS — I10 Essential (primary) hypertension: Secondary | ICD-10-CM | POA: Diagnosis not present

## 2019-07-05 DIAGNOSIS — E8779 Other fluid overload: Secondary | ICD-10-CM

## 2019-07-05 DIAGNOSIS — I200 Unstable angina: Secondary | ICD-10-CM | POA: Diagnosis not present

## 2019-07-05 MED ORDER — CARVEDILOL 12.5 MG PO TABS: 12.5000 mg | ORAL_TABLET | Freq: Two times a day (BID) | ORAL | 2 refills | Status: DC

## 2019-07-05 MED ORDER — HYDROCHLOROTHIAZIDE 12.5 MG PO CAPS: 12.5000 mg | ORAL_CAPSULE | Freq: Every day | ORAL | 2 refills | Status: DC

## 2019-07-05 NOTE — Patient Instructions (Addendum)
Medication Instructions:   Your physician has recommended you make the following change in your medication:   Increase your carvedilol to 12.5 mg by mouth twice daily.   You may take an extra hydrochlorothiazide 12.5 mg capsule daily as needed for weight gain of 3 lbs in one day or 5 lbs in one week.  Continue all other medications the same.  Labwork:  NONE  Testing/Procedures:  NONE  Follow-Up:  Your physician recommends that you schedule a follow-up appointment in: 3 months with Dr. Harl Bowie.  Any Other Special Instructions Will Be Listed Below (If Applicable).  WEIGH DAILY, every am, wearing the same amount of clothing  Record weights, contact Dr. Harl Bowie for weight gain of 3 lbs in a day or 5 lbs in a week  Limit sodium to 500 mg per meal, total 2000 mg per day  Limit all liquids to 1.5-2 liters/quarts per day  Exercise 4-5 times per week with 30 minute sessions. Please start off at 10 minutes and work your way up to 30 minutes.   If you need a refill on your cardiac medications before your next appointment, please call your pharmacy. Low-Sodium Eating Plan Sodium, which is an element that makes up salt, helps you maintain a healthy balance of fluids in your body. Too much sodium can increase your blood pressure and cause fluid and waste to be held in your body. Your health care provider or dietitian may recommend following this plan if you have high blood pressure (hypertension), kidney disease, liver disease, or heart failure. Eating less sodium can help lower your blood pressure, reduce swelling, and protect your heart, liver, and kidneys. What are tips for following this plan? General guidelines  Most people on this plan should limit their sodium intake to 1,500-2,000 mg (milligrams) of sodium each day. Reading food labels   The Nutrition Facts label lists the amount of sodium in one serving of the food. If you eat more than one serving, you must multiply the listed  amount of sodium by the number of servings.  Choose foods with less than 140 mg of sodium per serving.  Avoid foods with 300 mg of sodium or more per serving. Shopping  Look for lower-sodium products, often labeled as "low-sodium" or "no salt added."  Always check the sodium content even if foods are labeled as "unsalted" or "no salt added".  Buy fresh foods. ? Avoid canned foods and premade or frozen meals. ? Avoid canned, cured, or processed meats  Buy breads that have less than 80 mg of sodium per slice. Cooking  Eat more home-cooked food and less restaurant, buffet, and fast food.  Avoid adding salt when cooking. Use salt-free seasonings or herbs instead of table salt or sea salt. Check with your health care provider or pharmacist before using salt substitutes.  Cook with plant-based oils, such as canola, sunflower, or olive oil. Meal planning  When eating at a restaurant, ask that your food be prepared with less salt or no salt, if possible.  Avoid foods that contain MSG (monosodium glutamate). MSG is sometimes added to [place] food, bouillon, and some canned foods. What foods are recommended? The items listed may not be a complete list. Talk with your dietitian about what dietary choices are best for you. Grains Low-sodium cereals, including oats, puffed wheat and rice, and shredded wheat. Low-sodium crackers. Unsalted rice. Unsalted pasta. Low-sodium bread. Whole-grain breads and whole-grain pasta. Vegetables Fresh or frozen vegetables. "No salt added" canned vegetables. "No salt added"  tomato sauce and paste. Low-sodium or reduced-sodium tomato and vegetable juice. Fruits Fresh, frozen, or canned fruit. Fruit juice. Meats and other protein foods Fresh or frozen (no salt added) meat, poultry, seafood, and fish. Low-sodium canned tuna and salmon. Unsalted nuts. Dried peas, beans, and lentils without added salt. Unsalted canned beans. Eggs. Unsalted nut  butters. Dairy Milk. Soy milk. Cheese that is naturally low in sodium, such as ricotta cheese, fresh mozzarella, or Swiss cheese Low-sodium or reduced-sodium cheese. Cream cheese. Yogurt. Fats and oils Unsalted butter. Unsalted margarine with no trans fat. Vegetable oils such as canola or olive oils. Seasonings and other foods Fresh and dried herbs and spices. Salt-free seasonings. Low-sodium mustard and ketchup. Sodium-free salad dressing. Sodium-free light mayonnaise. Fresh or refrigerated horseradish. Lemon juice. Vinegar. Homemade, reduced-sodium, or low-sodium soups. Unsalted popcorn and pretzels. Low-salt or salt-free chips. What foods are not recommended? The items listed may not be a complete list. Talk with your dietitian about what dietary choices are best for you. Grains Instant hot cereals. Bread stuffing, pancake, and biscuit mixes. Croutons. Seasoned rice or pasta mixes. Noodle soup cups. Boxed or frozen macaroni and cheese. Regular salted crackers. Self-rising flour. Vegetables Sauerkraut, pickled vegetables, and relishes. Olives. [place] fries. Onion rings. Regular canned vegetables (not low-sodium or reduced-sodium). Regular canned tomato sauce and paste (not low-sodium or reduced-sodium). Regular tomato and vegetable juice (not low-sodium or reduced-sodium). Frozen vegetables in sauces. Meats and other protein foods Meat or fish that is salted, canned, smoked, spiced, or pickled. Bacon, ham, sausage, hotdogs, corned beef, chipped beef, packaged lunch meats, salt pork, jerky, pickled herring, anchovies, regular canned tuna, sardines, salted nuts. Dairy Processed cheese and cheese spreads. Cheese curds. Blue cheese. Feta cheese. String cheese. Regular cottage cheese. Buttermilk. Canned milk. Fats and oils Salted butter. Regular margarine. Ghee. Bacon fat. Seasonings and other foods Onion salt, garlic salt, seasoned salt, table salt, and sea salt. Canned and packaged gravies.  Worcestershire sauce. Tartar sauce. Barbecue sauce. Teriyaki sauce. Soy sauce, including reduced-sodium. Steak sauce. Fish sauce. Oyster sauce. Cocktail sauce. Horseradish that you find on the shelf. Regular ketchup and mustard. Meat flavorings and tenderizers. Bouillon cubes. Hot sauce and Tabasco sauce. Premade or packaged marinades. Premade or packaged taco seasonings. Relishes. Regular salad dressings. Salsa. Potato and tortilla chips. Corn chips and puffs. Salted popcorn and pretzels. Canned or dried soups. Pizza. Frozen entrees and pot pies. Summary  Eating less sodium can help lower your blood pressure, reduce swelling, and protect your heart, liver, and kidneys.  Most people on this plan should limit their sodium intake to 1,500-2,000 mg (milligrams) of sodium each day.  Canned, boxed, and frozen foods are high in sodium. Restaurant foods, fast foods, and pizza are also very high in sodium. You also get sodium by adding salt to food.  Try to cook at home, eat more fresh fruits and vegetables, and eat less fast food, canned, processed, or prepared foods. This information is not intended to replace advice given to you by your health care provider. Make sure you discuss any questions you have with your health care provider. Document Released: 04/30/2002 Document Revised: 10/21/2017 Document Reviewed: 11/01/2016 Elsevier Patient Education  2020 [place] Heart-healthy meal planning includes:  Eating less unhealthy fats.  Eating more healthy fats.  Making other changes in your diet. Talk with your doctor or a diet specialist (dietitian) to create an eating plan that is right for you. What is my plan? Your doctor may recommend an  eating plan that includes:  Total fat: ______% or less of total calories a day.  Saturated fat: ______% or less of total calories a day.  Cholesterol: less than _________mg a day. What are tips for following this  plan? Cooking Avoid frying your food. Try to bake, boil, grill, or broil it instead. You can also reduce fat by:  Removing the skin from poultry.  Removing all visible fats from meats.  Steaming vegetables in water or broth. Meal planning   At meals, divide your plate into four equal parts: ? Fill one-half of your plate with vegetables and green salads. ? Fill one-fourth of your plate with whole grains. ? Fill one-fourth of your plate with lean protein foods.  Eat 4-5 servings of vegetables per day. A serving of vegetables is: ? 1 cup of raw or cooked vegetables. ? 2 cups of raw leafy greens.  Eat 4-5 servings of fruit per day. A serving of fruit is: ? 1 medium whole fruit. ?  cup of dried fruit. ?  cup of fresh, frozen, or canned fruit. ?  cup of 100% fruit juice.  Eat more foods that have soluble fiber. These are apples, broccoli, carrots, beans, peas, and barley. Try to get 20-30 g of fiber per day.  Eat 4-5 servings of nuts, legumes, and seeds per week: ? 1 serving of dried beans or legumes equals  cup after being cooked. ? 1 serving of nuts is  cup. ? 1 serving of seeds equals 1 tablespoon. General information  Eat more home-cooked food. Eat less restaurant, buffet, and fast food.  Limit or avoid alcohol.  Limit foods that are high in starch and sugar.  Avoid fried foods.  Lose weight if you are overweight.  Keep track of how much salt (sodium) you eat. This is important if you have high blood pressure. Ask your doctor to tell you more about this.  Try to add vegetarian meals each week. Fats  Choose healthy fats. These include olive oil and canola oil, flaxseeds, walnuts, almonds, and seeds.  Eat more omega-3 fats. These include salmon, mackerel, sardines, tuna, flaxseed oil, and ground flaxseeds. Try to eat fish at least 2 times each week.  Check food labels. Avoid foods with trans fats or high amounts of saturated fat.  Limit saturated  fats. ? These are often found in animal products, such as meats, butter, and cream. ? These are also found in plant foods, such as palm oil, palm kernel oil, and coconut oil.  Avoid foods with partially hydrogenated oils in them. These have trans fats. Examples are stick margarine, some tub margarines, cookies, crackers, and other baked goods. What foods can I eat? Fruits All fresh, canned (in natural juice), or frozen fruits. Vegetables Fresh or frozen vegetables (raw, steamed, roasted, or grilled). Green salads. Grains Most grains. Choose whole wheat and whole grains most of the time. Rice and pasta, including brown rice and pastas made with whole wheat. Meats and other proteins Lean, well-trimmed beef, veal, pork, and lamb. Chicken and [place] without skin. All fish and shellfish. Wild duck, rabbit, pheasant, and venison. Egg whites or low-cholesterol egg substitutes. Dried beans, peas, lentils, and tofu. Seeds and most nuts. Dairy Low-fat or nonfat cheeses, including ricotta and mozzarella. Skim or 1% milk that is liquid, powdered, or evaporated. Buttermilk that is made with low-fat milk. Nonfat or low-fat yogurt. Fats and oils Non-hydrogenated (trans-free) margarines. Vegetable oils, including soybean, sesame, sunflower, olive, peanut, safflower, corn, canola, and cottonseed. Salad  dressings or mayonnaise made with a vegetable oil. Beverages Mineral water. Coffee and tea. Diet carbonated beverages. Sweets and desserts Sherbet, gelatin, and fruit ice. Small amounts of dark chocolate. Limit all sweets and desserts. Seasonings and condiments All seasonings and condiments. The items listed above may not be a complete list of foods and drinks you can eat. Contact a dietitian for more options. What foods should I avoid? Fruits Canned fruit in heavy syrup. Fruit in cream or butter sauce. Fried fruit. Limit coconut. Vegetables Vegetables cooked in cheese, cream, or butter sauce. Fried  vegetables. Grains Breads that are made with saturated or trans fats, oils, or whole milk. Croissants. Sweet rolls. Donuts. High-fat crackers, such as cheese crackers. Meats and other proteins Fatty meats, such as hot dogs, ribs, sausage, bacon, rib-eye roast or steak. High-fat deli meats, such as salami and bologna. Caviar. Domestic duck and goose. Organ meats, such as liver. Dairy Cream, sour cream, cream cheese, and creamed cottage cheese. Whole-milk cheeses. Whole or 2% milk that is liquid, evaporated, or condensed. Whole buttermilk. Cream sauce or high-fat cheese sauce. Yogurt that is made from whole milk. Fats and oils Meat fat, or shortening. Cocoa butter, hydrogenated oils, palm oil, coconut oil, palm kernel oil. Solid fats and shortenings, including bacon fat, salt pork, lard, and butter. Nondairy cream substitutes. Salad dressings with cheese or sour cream. Beverages Regular sodas and juice drinks with added sugar. Sweets and desserts Frosting. Pudding. Cookies. Cakes. Pies. Milk chocolate or white chocolate. Buttered syrups. Full-fat ice cream or ice cream drinks. The items listed above may not be a complete list of foods and drinks to avoid. Contact a dietitian for more information. Summary  Heart-healthy meal planning includes eating less unhealthy fats, eating more healthy fats, and making other changes in your diet.  Eat a balanced diet. This includes fruits and vegetables, low-fat or nonfat dairy, lean protein, nuts and legumes, whole grains, and heart-healthy oils and fats. This information is not intended to replace advice given to you by your health care provider. Make sure you discuss any questions you have with your health care provider. Document Released: 05/09/2012 Document Revised: 01/12/2018 Document Reviewed: 12/16/2017 Elsevier Patient Education  2020 [employer].

## 2019-07-05 NOTE — Progress Notes (Signed)
Cardiology Office Note   Date:  07/05/2019   ID:  Amy Snow, DOB 1966-07-16, MRN [id number]  PCP:  Wyatt Haste, NP Cardiologist:  Quay Burow, MD 05/25/2019 in-hospital Rosaria Ferries, PA-C   No chief complaint on file.   History of Present Illness: Amy Snow is a 53 y.o. female with a history of  STEMI 2016 s/p DES x 2 PDA, med rx for residual dz, EF 25%>> recovered to nl, HTN, HLD, GERD, DM, fibromyalgia, mild anemia, anxiety  05/15/2019 office visit w/ sx concerning for progressive angina. Admitted 07/03-07/01/2019  for outpt cath, s/p DES LAD w/ med rx for PDA/PL dz, EF 50-55% at cath, 55-60% by echo  Amy Snow presents for cardiology follow up.  She feels so much better after the PCI. Says she cannot believe what a difference the stenting made.   She is back at work full time. 5 days, 8-11 hr/day.   She has not missed any doses of meds.  Her weight has been 189-190 at home.   She has someone she can turn to for emotional support. She is stressed by all this.   She is doing better with fluid intake, has cut back a lot. She is not adding salt when she eats out. She is eating oatmeal instead of cookies at night when she wants something sweet. She loves salt, but is trying to make permanent changes.   She is not exercising much, but stays busy at work. Will try to work on this.  Was drinking entirely too many liquids, has cut back, much more conscious of what she eats and drinks. Requests info on low Na eating  No LE edema, no orthopnea or PND. DOE is improving.   She has anxiety about her heart, but is trying to work through that. Says can follow up for this.   Past Medical History:  Diagnosis Date  . Anxiety and depression   . CAD (coronary artery disease)    a. s/p STEMI 2016 s/p DES x 2 PDA with residual dz treated medically. b. [place] in 2020 with cath s/p DES to long high grade lesion in LAD, residual disease treated medically, EF 50%.   . Diabetes mellitus, type II ([place])   . Fibromyalgia   . GERD (gastroesophageal reflux disease)   . Hyperlipidemia   . Hypertension   . Ischemic cardiomyopathy    a. EF <25% in 2016, improved to normal thereafter. b. EF 50% in 05/2019.  Marland Kitchen Myocardial infarct [facility])    august 2016    Past Surgical History:  Procedure Laterality Date  . CARDIAC CATHETERIZATION N/A 07/05/2015   Procedure: Left Heart Cath and Coronary Angiography;  Surgeon: Lorretta Harp, MD;  Location: [place] [facility];  Service: Cardiovascular;  Laterality: N/A;  . CESAREAN SECTION    . CORONARY STENT INTERVENTION N/A 05/24/2019   Procedure: CORONARY STENT INTERVENTION;  Surgeon: Lorretta Harp, MD;  Location: [place] [facility];  Service: Cardiovascular;  Laterality: N/A;  . HERNIA REPAIR    . LEFT HEART CATH AND CORONARY ANGIOGRAPHY N/A 05/24/2019   Procedure: LEFT HEART CATH AND CORONARY ANGIOGRAPHY;  Surgeon: Lorretta Harp, MD;  Location: [place] [facility];  Service: Cardiovascular;  Laterality: N/A;  . TONSILLECTOMY    . TUBAL LIGATION      Current Outpatient Medications  Medication Sig Dispense Refill  . acetaminophen (TYLENOL) 650 MG CR tablet Take 1,300 mg by mouth as needed.     Marland Kitchen  amitriptyline (ELAVIL) 25 MG tablet Take 25 mg by mouth at bedtime.    Marland Kitchen aspirin 81 MG chewable tablet Chew 1 tablet (81 mg total) by mouth daily. 90 tablet 3  . atorvastatin (LIPITOR) 80 MG tablet Take 1 tablet (80 mg total) by mouth daily at 6 PM. 90 tablet 1  . carvedilol (COREG) 6.25 MG tablet Take 1 tablet (6.25 mg total) by mouth 2 (two) times daily with a meal. 180 tablet 1  . Cholecalciferol (VITAMIN D3) 1.25 MG (50000 UT) CAPS Take 50,000 Units by mouth every Wednesday. In the morning.    . clopidogrel (PLAVIX) 75 MG tablet Take 75 mg by mouth daily.    . DULoxetine (CYMBALTA) 20 MG capsule Take 40 mg by mouth 2 (two) times daily.     . hydrochlorothiazide (MICROZIDE) 12.5 MG capsule Take 12.5 mg by mouth daily.      . insulin NPH-regular Human (70-30) 100 UNIT/ML injection Inject 80-100 Units into the skin 2 (two) times a day. Sliding Scale 200-300=100 units  Less than 100=80    . lisinopril (ZESTRIL) 10 MG tablet Take 10 mg by mouth every evening.     . nitroGLYCERIN (NITROSTAT) 0.4 MG SL tablet Place 1 tablet (0.4 mg total) under the tongue every 5 (five) minutes x 3 doses as needed for chest pain (if no relief after 3rd dose, proceed to the ED for an evaluation or call 911). 25 tablet 3  . pantoprazole (PROTONIX) 40 MG tablet Take 1 tablet (40 mg total) by mouth daily. 30 tablet 1   No current facility-administered medications for this visit.     Allergies:   Patient has no known allergies.    Social History:  The patient  reports that she has never smoked. She has never used smokeless tobacco. She reports that she does not drink alcohol or use drugs.   Family History:  The patient's family history includes Alcohol abuse in her father and mother; Anxiety disorder in her father and sister; Depression in her cousin, father, maternal aunt, and sister.  She indicated that her mother is deceased. She indicated that her father is deceased. She indicated that the status of her sister is unknown. She indicated that the status of her maternal aunt is unknown. She indicated that the status of her cousin is unknown.   ROS:  Please see the history of present illness. All other systems are reviewed and negative.    PHYSICAL EXAM: VS:  BP (!) 172/84   Pulse 91   Ht 4\' 11"  (1.499 m)   Wt 192 lb 9.6 oz (87.4 kg)   SpO2 94%   BMI 38.90 kg/m  , BMI Body mass index is 38.9 kg/m. GEN: Well nourished, well developed, female in no acute distress HEENT: normal for age  Neck: no JVD, no carotid bruit, no masses Cardiac: RRR; no murmur, no rubs, or gallops Respiratory:  clear to auscultation bilaterally, normal work of breathing GI: soft, nontender, nondistended, + BS MS: no deformity or atrophy; no edema;  distal pulses are 2+ in all 4 extremities, R radial cath site is well-healed Skin: warm and dry, no rash Neuro:  Strength and sensation are intact Psych: euthymic mood, full affect   EKG:  EKG is not ordered today.  ECHO: 06/14/2019  1. The left ventricle has normal systolic function, with an ejection fraction of 55-60%. The cavity size was normal. Left ventricular diastolic parameters were normal.  2. The right ventricle has normal systolic function. The  cavity was normal. There is no increase in right ventricular wall thickness. Right ventricular systolic pressure is normal with an estimated pressure of 10.7 mmHg.  3. The mitral valve is grossly normal.  4. The tricuspid valve is grossly normal.  5. The aortic valve is tricuspid.  6. The aorta is normal in size and structure.  7. The inferior vena cava was normal in size with <50% respiratory variability.   CATH: 05/24/19 IMPRESSION:Successful approximately PCI and drug-eluting stenting of a long high-grade lesion probably responsible for her accelerated symptoms over the last several months. The circumflex was a relatively small vessel with long segmental disease. The right was a large vessel with a patent PDA stent and some moderate disease in the mid PDA and PLA. LV function was preserved although there was mild inferoapical hypokinesia with a visual EF estimated at approximate 50%. Patient will need uninterrupted dual antiplatelet platelet therapy for 1 year along with risk factor modification. She will be gently hydrated overnight and discharged home in the morning. Conclusion   Prox LAD to Mid LAD lesion is 95% stenosed.  Mid LAD lesion is 90% stenosed.  Prox Cx lesion is 95% stenosed.  Prox Cx to Mid Cx lesion is 90% stenosed.  Prox RCA lesion is 30% stenosed.  2nd RPL lesion is 80% stenosed.  RPDA-2 lesion is 80% stenosed.  Previously placed RPDA-1 stent (unknown type) is widely patent.  A drug-eluting stent  was successfully placed using a STENT SYNERGY DES 2.25X28.  Post intervention, there is a 0% residual stenosis.  Post intervention, there is a 0% residual stenosis.  There is mild left ventricular systolic dysfunction.  LV end diastolic pressure is normal.  The left ventricular ejection fraction is 50-55% by visual estimate.   Diagnostic Dominance: Right  Intervention     Recent Labs: 05/25/2019: BUN 22; Creatinine, Ser 0.95; Hemoglobin 11.5; Platelets 210; Potassium 4.1; Sodium 135  CBC    Component Value Date/Time   WBC 6.2 05/25/2019 0653   RBC 4.59 05/25/2019 0653   HGB 11.5 (L) 05/25/2019 0653   HCT 36.4 05/25/2019 0653   PLT 210 05/25/2019 0653   MCV 79.3 (L) 05/25/2019 0653   MCH 25.1 (L) 05/25/2019 0653   MCHC 31.6 05/25/2019 0653   RDW 16.1 (H) 05/25/2019 0653   LYMPHSABS 2.4 04/04/2016 0007   MONOABS 0.4 04/04/2016 0007   EOSABS 0.1 04/04/2016 0007   BASOSABS 0.0 04/04/2016 0007   CMP Latest Ref Rng & Units 05/25/2019 05/22/2019 04/04/2016  Glucose 70 - 99 mg/dL 241(H) 176(H) 184(H)  BUN 6 - 20 mg/dL 22(H) 29(H) 18  Creatinine 0.44 - 1.00 mg/dL 0.95 0.87 0.57  Sodium 135 - 145 mmol/L 135 143 133(L)  Potassium 3.5 - 5.1 mmol/L 4.1 4.4 3.3(L)  Chloride 98 - 111 mmol/L 100 103 103  CO2 22 - 32 mmol/L 23 28 23   Calcium 8.9 - 10.3 mg/dL 9.1 9.2 8.7(L)  Total Protein 6.1 - 8.1 g/dL - - -  Total Bilirubin 0.2 - 1.2 mg/dL - - -  Alkaline Phos 33 - 115 U/L - - -  AST 10 - 35 U/L - - -  ALT 6 - 29 U/L - - -     Lipid Panel Lab Results  Component Value Date   CHOL 231 (H) 09/23/2015   HDL 25 (L) 09/23/2015   LDLCALC NOT CALC 09/23/2015   LDLDIRECT 70 09/23/2015   TRIG 737 (H) 09/23/2015   CHOLHDL 9.2 (H) 09/23/2015  Wt Readings from Last 3 Encounters:  07/05/19 192 lb 9.6 oz (87.4 kg)  05/25/19 185 lb 4.8 oz (84.1 kg)  05/15/19 190 lb 3.2 oz (86.3 kg)     Other studies Reviewed: Additional studies/ records that were reviewed today include:  office notes, hospital records and testing.  ASSESSMENT AND PLAN:  1.  CAD:  - Sx are much improved after stenting - she still has 80-90% CFX>> med rx, but no sx so far - continue ASA, BB, Plavix, statin, nitro - increase BB - no need to add Imdur at this time, follow for sx  2. HTN - increase BB and see if that gets BP to goal < 130/80 - continue rx  3. Volume overload - no systolic or diastolic dysfunction on echo - likely 2nd excess intake fluid, Na - dietary changes - ok to take extra HCTZ prn for wt gain    Current medicines are reviewed at length with the patient today.  The patient does not have concerns regarding medicines.  The following changes have been made:  Increase Coreg  Labs/ tests ordered today include:  No orders of the defined types were placed in this encounter.    Disposition:   FU with Quay Burow, MD  Signed, Rosaria Ferries, PA-C  07/05/2019 10:36 PM    [place] Phone: [phone]; Fax: [phone]

## 2019-07-09 ENCOUNTER — Telehealth: Payer: Self-pay | Admitting: *Deleted

## 2019-07-09 NOTE — Telephone Encounter (Signed)
Spoke with patient regarding [facility] clinic appt with Pharm D that was ordered by Liz Malady, PA on 05/31/19.  Patient saw Rosaria Ferries on 07/05/19 (patient had a follow up visit) and she states she forgot to ask Suanne Marker about the appointment---patient stated she does not want to schedule because she does not have transportation to come to [place] does not drive and has a hard time just getting around town.

## 2019-08-29 DIAGNOSIS — E119 Type 2 diabetes mellitus without complications: Secondary | ICD-10-CM | POA: Diagnosis not present

## 2019-08-29 DIAGNOSIS — J302 Other seasonal allergic rhinitis: Secondary | ICD-10-CM | POA: Diagnosis not present

## 2019-08-29 DIAGNOSIS — F411 Generalized anxiety disorder: Secondary | ICD-10-CM | POA: Diagnosis not present

## 2019-10-12 ENCOUNTER — Ambulatory Visit: Payer: PRIVATE HEALTH INSURANCE | Admitting: Cardiology

## 2019-11-14 ENCOUNTER — Other Ambulatory Visit: Payer: Self-pay | Admitting: Physician Assistant

## 2019-11-29 ENCOUNTER — Ambulatory Visit: Payer: PRIVATE HEALTH INSURANCE | Admitting: Cardiology

## 2019-12-10 DIAGNOSIS — J029 Acute pharyngitis, unspecified: Secondary | ICD-10-CM | POA: Diagnosis not present

## 2019-12-10 DIAGNOSIS — E7800 Pure hypercholesterolemia, unspecified: Secondary | ICD-10-CM | POA: Diagnosis not present

## 2019-12-10 DIAGNOSIS — J020 Streptococcal pharyngitis: Secondary | ICD-10-CM | POA: Diagnosis not present

## 2019-12-10 DIAGNOSIS — Z955 Presence of coronary angioplasty implant and graft: Secondary | ICD-10-CM | POA: Diagnosis not present

## 2019-12-10 DIAGNOSIS — I252 Old myocardial infarction: Secondary | ICD-10-CM | POA: Diagnosis not present

## 2019-12-10 DIAGNOSIS — Z794 Long term (current) use of insulin: Secondary | ICD-10-CM | POA: Diagnosis not present

## 2019-12-10 DIAGNOSIS — I10 Essential (primary) hypertension: Secondary | ICD-10-CM | POA: Diagnosis not present

## 2019-12-10 DIAGNOSIS — E119 Type 2 diabetes mellitus without complications: Secondary | ICD-10-CM | POA: Diagnosis not present

## 2020-01-08 ENCOUNTER — Ambulatory Visit: Payer: PRIVATE HEALTH INSURANCE | Admitting: Cardiology

## 2020-01-15 DIAGNOSIS — K219 Gastro-esophageal reflux disease without esophagitis: Secondary | ICD-10-CM | POA: Diagnosis not present

## 2020-01-15 DIAGNOSIS — E119 Type 2 diabetes mellitus without complications: Secondary | ICD-10-CM | POA: Diagnosis not present

## 2020-01-28 ENCOUNTER — Telehealth: Payer: Self-pay | Admitting: Cardiology

## 2020-01-28 NOTE — Telephone Encounter (Signed)
1. What dental office are you calling from? [facility]   2. What is your office phone number? [phone]  3. What is your fax number  4. What type of procedure is the patient having performed?  Extraction of a tooth   5. What date is procedure scheduled or is the patient there now?  Has not been scheduled yet  (if the patient is at the dentist's office question goes to their cardiologist if he/she is in the office.  If not, question should go to the DOD).   6. What is your question (ex. Antibiotics prior to procedure, holding medication-we need to know how long dentist wants pt to hold med)?  7. Patient is on Plavix - Wanting to send patient to a surgeon but patient is not financially able to afford this.     Patient walked into the office wanting to know what she needs to do.    [phone]) patient would like a telephone call

## 2020-01-28 NOTE — Telephone Encounter (Signed)
Spoke with [facility] dentist office who says pt is being referred to oral surgery which pt declined to be referred at this time due to money issues and transportation - pt will call back once she has scheduled with oral surgeon or her regular dentist

## 2020-01-29 DIAGNOSIS — E119 Type 2 diabetes mellitus without complications: Secondary | ICD-10-CM | POA: Diagnosis not present

## 2020-01-29 DIAGNOSIS — I10 Essential (primary) hypertension: Secondary | ICD-10-CM | POA: Diagnosis not present

## 2020-01-29 DIAGNOSIS — Z794 Long term (current) use of insulin: Secondary | ICD-10-CM | POA: Diagnosis not present

## 2020-02-13 ENCOUNTER — Telehealth: Payer: Self-pay | Admitting: Cardiology

## 2020-02-13 DIAGNOSIS — Z955 Presence of coronary angioplasty implant and graft: Secondary | ICD-10-CM | POA: Diagnosis not present

## 2020-02-13 DIAGNOSIS — Z794 Long term (current) use of insulin: Secondary | ICD-10-CM | POA: Diagnosis not present

## 2020-02-13 DIAGNOSIS — E119 Type 2 diabetes mellitus without complications: Secondary | ICD-10-CM | POA: Diagnosis not present

## 2020-02-13 DIAGNOSIS — E7800 Pure hypercholesterolemia, unspecified: Secondary | ICD-10-CM | POA: Diagnosis not present

## 2020-02-13 DIAGNOSIS — R079 Chest pain, unspecified: Secondary | ICD-10-CM | POA: Diagnosis not present

## 2020-02-13 DIAGNOSIS — I2510 Atherosclerotic heart disease of native coronary artery without angina pectoris: Secondary | ICD-10-CM | POA: Diagnosis not present

## 2020-02-13 DIAGNOSIS — R6884 Jaw pain: Secondary | ICD-10-CM | POA: Diagnosis not present

## 2020-02-13 DIAGNOSIS — I10 Essential (primary) hypertension: Secondary | ICD-10-CM | POA: Diagnosis not present

## 2020-02-13 DIAGNOSIS — E785 Hyperlipidemia, unspecified: Secondary | ICD-10-CM | POA: Diagnosis not present

## 2020-02-13 DIAGNOSIS — R519 Headache, unspecified: Secondary | ICD-10-CM | POA: Diagnosis not present

## 2020-02-13 DIAGNOSIS — Z20822 Contact with and (suspected) exposure to covid-19: Secondary | ICD-10-CM | POA: Diagnosis not present

## 2020-02-13 DIAGNOSIS — I259 Chronic ischemic heart disease, unspecified: Secondary | ICD-10-CM | POA: Diagnosis not present

## 2020-02-13 DIAGNOSIS — I252 Old myocardial infarction: Secondary | ICD-10-CM | POA: Diagnosis not present

## 2020-02-13 DIAGNOSIS — R61 Generalized hyperhidrosis: Secondary | ICD-10-CM | POA: Diagnosis not present

## 2020-02-13 NOTE — Telephone Encounter (Signed)
Pt c/o of Chest Pain: 1. Are you having CP right now?  Tightness in chest  2. Are you experiencing any other symptoms (ex. SOB, nausea, vomiting, sweating)? Shortness of breath ,had nausea and dizziness over weekend. 3. How long have you been experiencing CP? Yesterday  4. Is your CP continuous or coming and going? Chest tightness  5. Have you taken Nitroglycerin? Yes -  Took last evening 8pm  SCALE 1-10  (7)

## 2020-02-13 NOTE — Telephone Encounter (Signed)
Pt c/o SOB dizziness chest tightness starting over the weekend took 1 nitroglycerin last night and this relieved her pain and she was able to sleep - this morning went to work and still c/o chest tightness and squeezing feeling in her chest - says its hard to get a good deep breath - doesn't know what HR/BP has been - pt advised she should report to [facility] for further evaluation - pt will have someone drive her from work

## 2020-02-14 DIAGNOSIS — R079 Chest pain, unspecified: Secondary | ICD-10-CM | POA: Diagnosis not present

## 2020-02-15 ENCOUNTER — Encounter: Payer: Self-pay | Admitting: *Deleted

## 2020-02-15 NOTE — Progress Notes (Deleted)
Cardiology Office Note  Date: 02/18/2020   ID: Amy, Snow [birth year], MRN [id number]  PCP:  Wyatt Haste, NP  Cardiologist:  Quay Burow, MD Electrophysiologist:  None   Chief Complaint: Recent chest pain, shortness of breath, dizziness, N/V.   History of Present Illness: Amy Snow is a 54 y.o. female with a history of coronary artery disease status post STEMI 2016, status post DES x 2 to PDA.  Medical treatment for residual disease, HTN, HLD, GERD, DM, fibromyalgia, mild anemia, anxiety  On 05/15/2019 she had an office visit with symptoms concerning for progressive angina.  She was admitted May 25, 2019 for outpatient cath.  She had a DES to LAD with medical treatment for PDA and PLA disease.  EF 50 to 55% at cath, 55 to 60% by echo.  She was followed up by Aline Brochure, PA on 07/05/2019.  She admitting to feeling much better after the PCI stating she could not believe what a difference the stenting made.  She was back at work working 5 days a week.  She was taking her meds as prescribed.  She was trying to make lifestyle changes such as doing better with fluid intake, limiting salt.  She was not exercising but stayed busy at work.  She had no lower extremity edema, orthopnea or PND.  Dyspnea was improving.  She was noted to have anxiety about her heart issues.  Patient called 02/13/2020 c/o chest tightness, squeezing,  SOB ,and dizziness at home and the next day at work. Had nausea and dizziness over the weekend and stated it was hard to get a good deep breath. She went to work and continued to have chest tightness.  She stated pain was relieved with SL NTG x 1.  Rated pain 7/10 intensity. She was advised to go to [facility] ED.  There is no record of a visit to [facility] ED on the day of the conversation with clinic staff.  There is a note in the system stated she went to [facility] for c/o chest pain.We do not have the notes from this visit. We are attempting to  obtain the records.     Past Medical History:  Diagnosis Date   Anxiety and depression    CAD (coronary artery disease)    a. s/p STEMI 2016 s/p DES x 2 PDA with residual dz treated medically. b. [place] in 2020 with cath s/p DES to long high grade lesion in LAD, residual disease treated medically, EF 50%.   Diabetes mellitus, type II ([place])    Fibromyalgia    GERD (gastroesophageal reflux disease)    Hyperlipidemia    Hypertension    Ischemic cardiomyopathy    a. EF <25% in 2016, improved to normal thereafter. b. EF 50% in 05/2019.   Myocardial infarct [facility])    august 2016    Past Surgical History:  Procedure Laterality Date   CARDIAC CATHETERIZATION N/A 07/05/2015   Procedure: Left Heart Cath and Coronary Angiography;  Surgeon: Lorretta Harp, MD;  Location: [place] [facility];  Service: Cardiovascular;  Laterality: N/A;   CESAREAN SECTION     CORONARY STENT INTERVENTION N/A 05/24/2019   Procedure: CORONARY STENT INTERVENTION;  Surgeon: Lorretta Harp, MD;  Location: [place] [facility];  Service: Cardiovascular;  Laterality: N/A;   HERNIA REPAIR     LEFT HEART CATH AND CORONARY ANGIOGRAPHY N/A 05/24/2019   Procedure: LEFT HEART CATH AND CORONARY ANGIOGRAPHY;  Surgeon: Gwenlyn Found,  Pearletha Forge, MD;  Location: [place] [facility];  Service: Cardiovascular;  Laterality: N/A;   TONSILLECTOMY     TUBAL LIGATION      Current Outpatient Medications  Medication Sig Dispense Refill   acetaminophen (TYLENOL) 650 MG CR tablet Take 1,300 mg by mouth as needed.      amitriptyline (ELAVIL) 25 MG tablet Take 25 mg by mouth at bedtime.     aspirin 81 MG chewable tablet Chew 1 tablet (81 mg total) by mouth daily. 90 tablet 3   atorvastatin (LIPITOR) 80 MG tablet TAKE 1 TABLET BY MOUTH ONCE DAILY AT  6  PM 90 tablet 3   carvedilol (COREG) 12.5 MG tablet Take 1 tablet (12.5 mg total) by mouth 2 (two) times daily. 180 tablet 2   Cholecalciferol (VITAMIN D3) 1.25 MG (50000 UT) CAPS  Take 50,000 Units by mouth every Wednesday. In the morning.     clopidogrel (PLAVIX) 75 MG tablet Take 75 mg by mouth daily.     DULoxetine (CYMBALTA) 20 MG capsule Take 40 mg by mouth 2 (two) times daily.      hydrochlorothiazide (MICROZIDE) 12.5 MG capsule Take 1 capsule (12.5 mg total) by mouth daily. May take an extra tablet daily as needed for weight gain of 3 lbs in one day or 5 lbs in one week 180 capsule 2   insulin NPH-regular Human (70-30) 100 UNIT/ML injection Inject 80-100 Units into the skin 2 (two) times a day. Sliding Scale 200-300=100 units  Less than 100=80     lisinopril (ZESTRIL) 10 MG tablet Take 10 mg by mouth every evening.      nitroGLYCERIN (NITROSTAT) 0.4 MG SL tablet Place 1 tablet (0.4 mg total) under the tongue every 5 (five) minutes x 3 doses as needed for chest pain (if no relief after 3rd dose, proceed to the ED for an evaluation or call 911). 25 tablet 3   pantoprazole (PROTONIX) 40 MG tablet Take 1 tablet (40 mg total) by mouth daily. 30 tablet 1   No current facility-administered medications for this visit.   Allergies:  Patient has no known allergies.   Social History: The patient  reports that she has never smoked. She has never used smokeless tobacco. She reports that she does not drink alcohol or use drugs.   Family History: The patient's family history includes Alcohol abuse in her father and mother; Anxiety disorder in her father and sister; Depression in her cousin, father, maternal aunt, and sister.   ROS:  Please see the history of present illness. Otherwise, complete review of systems is positive for {NONE DEFAULTED:18576::"none"}.  All other systems are reviewed and negative.  ROS  Physical Exam: VS:  There were no vitals taken for this visit., BMI There is no height or weight on file to calculate BMI.  Wt Readings from Last 3 Encounters:  07/05/19 192 lb 9.6 oz (87.4 kg)  05/25/19 185 lb 4.8 oz (84.1 kg)  05/15/19 190 lb 3.2 oz (86.3 kg)     General: Patient appears comfortable at rest. HEENT: Conjunctiva and lids normal, oropharynx clear with moist mucosa. Neck: Supple, no elevated JVP or carotid bruits, no thyromegaly. Lungs: Clear to auscultation, nonlabored breathing at rest. Cardiac: Regular rate and rhythm, no S3 or significant systolic murmur, no pericardial rub. Abdomen: Soft, nontender, no hepatomegaly, bowel sounds present, no guarding or rebound. Extremities: No pitting edema, distal pulses 2+. Skin: Warm and dry. Musculoskeletal: No kyphosis. Neuropsychiatric: Alert and oriented x3, affect grossly appropriate.  ECG:  {EKG/Telemetry Strips Reviewed:[id number]}  Recent Labwork: 05/25/2019: BUN 22; Creatinine, Ser 0.95; Hemoglobin 11.5; Platelets 210; Potassium 4.1; Sodium 135     Component Value Date/Time   CHOL 231 (H) 09/23/2015 1215   TRIG 737 (H) 09/23/2015 1215   HDL 25 (L) 09/23/2015 1215   CHOLHDL 9.2 (H) 09/23/2015 1215   VLDL NOT CALC 09/23/2015 1215   LDLCALC NOT CALC 09/23/2015 1215   LDLDIRECT 70 09/23/2015 1215    Other Studies Reviewed Today:   ECHO: 06/14/2019 1. The left ventricle has normal systolic function, with an ejection fraction of 55-60%. The cavity size was normal. Left ventricular diastolic parameters were normal. 2. The right ventricle has normal systolic function. The cavity was normal. There is no increase in right ventricular wall thickness. Right ventricular systolic pressure is normal with an estimated pressure of 10.7 mmHg. 3. The mitral valve is grossly normal. 4. The tricuspid valve is grossly normal. 5. The aortic valve is tricuspid. 6. The aorta is normal in size and structure. 7. The inferior vena cava was normal in size with <50% respiratory variability.   CATH: 05/24/19 IMPRESSION:Successful approximately PCI and drug-eluting stenting of a long high-grade lesion probably responsible for her accelerated symptoms over the last several months. The  circumflex was a relatively small vessel with long segmental disease. The right was a large vessel with a patent PDA stent and some moderate disease in the mid PDA and PLA. LV function was preserved although there was mild inferoapical hypokinesia with a visual EF estimated at approximate 50%. Patient will need uninterrupted dual antiplatelet platelet therapy for 1 year along with risk factor modification. She will be gently hydrated overnight and discharged home in the morning. Conclusion   Prox LAD to Mid LAD lesion is 95% stenosed.  Mid LAD lesion is 90% stenosed.  Prox Cx lesion is 95% stenosed.  Prox Cx to Mid Cx lesion is 90% stenosed.  Prox RCA lesion is 30% stenosed.  2nd RPL lesion is 80% stenosed.  RPDA-2 lesion is 80% stenosed.  Previously placed RPDA-1 stent (unknown type) is widely patent.  A drug-eluting stent was successfully placed using a STENT SYNERGY DES [id number].  Post intervention, there is a 0% residual stenosis.  Post intervention, there is a 0% residual stenosis.  There is mild left ventricular systolic dysfunction.  LV end diastolic pressure is normal.  The left ventricular ejection fraction is 50-55% by visual estimate.   Diagnostic Dominance: Right  Intervention      Assessment and Plan:  1. CAD in native artery   2. Essential hypertension   3. Angina pectoris ([place])   4. Mixed hyperlipidemia      Medication Adjustments/Labs and Tests Ordered: Current medicines are reviewed at length with the patient today.  Concerns regarding medicines are outlined above.   Disposition: Follow-up with ***  Signed, Levell July, NP 02/18/2020 6:05 AM    [facility] [place], [place], [place] [address] Phone: [phone]; Fax: [phone]

## 2020-02-18 ENCOUNTER — Other Ambulatory Visit: Payer: Self-pay

## 2020-02-18 ENCOUNTER — Ambulatory Visit: Payer: PRIVATE HEALTH INSURANCE | Admitting: Family Medicine

## 2020-02-18 ENCOUNTER — Ambulatory Visit (INDEPENDENT_AMBULATORY_CARE_PROVIDER_SITE_OTHER): Payer: BC Managed Care – PPO | Admitting: Family Medicine

## 2020-02-18 ENCOUNTER — Encounter: Payer: Self-pay | Admitting: Family Medicine

## 2020-02-18 VITALS — BP 188/90 | HR 90 | Ht 59.0 in | Wt 191.0 lb

## 2020-02-18 DIAGNOSIS — I2510 Atherosclerotic heart disease of native coronary artery without angina pectoris: Secondary | ICD-10-CM | POA: Diagnosis not present

## 2020-02-18 DIAGNOSIS — R079 Chest pain, unspecified: Secondary | ICD-10-CM

## 2020-02-18 DIAGNOSIS — E782 Mixed hyperlipidemia: Secondary | ICD-10-CM

## 2020-02-18 DIAGNOSIS — Z79899 Other long term (current) drug therapy: Secondary | ICD-10-CM

## 2020-02-18 DIAGNOSIS — I10 Essential (primary) hypertension: Secondary | ICD-10-CM

## 2020-02-18 MED ORDER — ISOSORBIDE MONONITRATE ER 30 MG PO TB24: 30.0000 mg | ORAL_TABLET | Freq: Every day | ORAL | 3 refills | Status: DC

## 2020-02-18 MED ORDER — LISINOPRIL 20 MG PO TABS: 20.0000 mg | ORAL_TABLET | Freq: Every day | ORAL | 3 refills | Status: DC

## 2020-02-18 NOTE — Patient Instructions (Addendum)
Medication Instructions:   Your physician has recommended you make the following change in your medication:   Increase lisinopril to 20 mg by mouth daily. You may take (2) of your 10 mg tablets daily until they are finished.  Start isosorbide mononitrate 30 mg by mouth daily  Continue other medications the same  Labwork:  Your physician recommends that you return for non-fasting lab work in 2 weeks to check your BMET, Direct LDL and LFT's. This may be done at [facility] from 8:00 am to 4:00 pm.  Testing/Procedures:  NONE  Follow-Up:  Your physician recommends that you schedule a follow-up appointment in: 1 month (office).  Any Other Special Instructions Will Be Listed Below (If Applicable).  If you need a refill on your cardiac medications before your next appointment, please call your pharmacy.

## 2020-02-18 NOTE — Progress Notes (Signed)
Cardiology Office Note  Date: 02/18/2020   ID: Amy, Snow October 31, 1966, MRN [id number]  PCP:  Wyatt Haste, NP  Cardiologist:  Carlyle Dolly, MD Electrophysiologist:  None   Chief Complaint: ED follow-up chest pain   History of Present Illness:  Amy Snow is a 54 y.o. female with a history of STEMI 2016 status post DES x2 PDA with medical treatment for residual disease.  HTN, HLD, GERD, DM, fibromyalgia, mild anemia, anxiety.  May 15, 2019 she had an office visit concerning for progressive angina.  She was admitted May 25, 2019 for outpatient cath and had a DES to LAD with medical treatment for PDA/PL disease.  EF was 50 to 55% at cath and 55 to 60% by echo.  Patient had a follow-up visit with APP Rosaria Ferries, PA July 05, 2019 stating she felt much better after she had stent placed.  She recently had a telephone call 02/13/2020 with Gerald Stabs, CMA here at the clinic stating she was having some shortness of breath, chest tightness, which started over the weekend and this relieved her pain.  She stated she was able to sleep and went to work still complaining of chest tightness and squeezing feeling in her chest complaining of hard to take a deep breath.  She was advised to go to the [facility] emergency room for evaluation.  She presented to [facility] ED on the same day 02/13/2020 for these complaints.  She was ruled out for MI / acute coronary syndrome and discharged home.  Her troponins were negative x4.  No EKG changes indicating ischemia.  Her blood pressure was elevated at 166/80.  She had a negative chest x-ray except for low lung volumes, otherwise no acute cardiopulmonary abnormality.  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  She did describe the chest pain as "kind of like" when she had previous heart attacks.  Today she describes mild chest tightness and mild dyspnea when exerting during work hours.   She denies any radiation, or associated nausea, vomiting, or diaphoresis.  She she states she has a significant amount of anxiety.  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  States she takes sertraline for depression but she still has a significant amount of anxiety.  She is asking about possible prescription for antianxiety medication.  Blood pressure is elevated on arrival today at 188/90.  Her BP was elevated at recent ED visit also at 166/80   Past Medical History:  Diagnosis Date  . Anxiety and depression   . CAD (coronary artery disease)    a. s/p STEMI 2016 s/p DES x 2 PDA with residual dz treated medically. b. [place] in 2020 with cath s/p DES to long high grade lesion in LAD, residual disease treated medically, EF 50%.  . Diabetes mellitus, type II ([place])   . Fibromyalgia   . GERD (gastroesophageal reflux disease)   . Hyperlipidemia   . Hypertension   . Ischemic cardiomyopathy    a. EF <25% in 2016, improved to normal thereafter. b. EF 50% in 05/2019.  Marland Kitchen Myocardial infarct [facility])    august 2016    Past Surgical History:  Procedure Laterality Date  . CARDIAC CATHETERIZATION N/A 07/05/2015   Procedure: Left Heart Cath and Coronary Angiography;  Surgeon: Lorretta Harp, MD;  Location: [place] CV LAB;  Service: Cardiovascular;  Laterality:  N/A;  . CESAREAN SECTION    . CORONARY STENT INTERVENTION N/A 05/24/2019   Procedure: CORONARY STENT INTERVENTION;  Surgeon: Lorretta Harp, MD;  Location: [place] CV LAB;  Service: Cardiovascular;  Laterality: N/A;  . HERNIA REPAIR    . LEFT HEART CATH AND CORONARY ANGIOGRAPHY N/A 05/24/2019   Procedure: LEFT HEART CATH AND CORONARY ANGIOGRAPHY;  Surgeon: Lorretta Harp, MD;  Location: [place] CV LAB;  Service: Cardiovascular;  Laterality: N/A;  . TONSILLECTOMY    . TUBAL LIGATION      Current Outpatient Medications  Medication Sig Dispense Refill  . acetaminophen  (TYLENOL) 650 MG CR tablet Take 1,300 mg by mouth as needed.     Marland Kitchen aspirin 81 MG chewable tablet Chew 1 tablet (81 mg total) by mouth daily. 90 tablet 3  . atorvastatin (LIPITOR) 80 MG tablet TAKE 1 TABLET BY MOUTH ONCE DAILY AT  6  PM 90 tablet 3  . carvedilol (COREG) 12.5 MG tablet Take 1 tablet (12.5 mg total) by mouth 2 (two) times daily. 180 tablet 2  . Cholecalciferol (VITAMIN D3) 1.25 MG (50000 UT) CAPS Take 50,000 Units by mouth every Wednesday. In the morning.    . clopidogrel (PLAVIX) 75 MG tablet Take 75 mg by mouth daily.    . DULoxetine (CYMBALTA) 20 MG capsule Take 40 mg by mouth 2 (two) times daily.     . hydrochlorothiazide (MICROZIDE) 12.5 MG capsule Take 1 capsule (12.5 mg total) by mouth daily. May take an extra tablet daily as needed for weight gain of 3 lbs in one day or 5 lbs in one week 180 capsule 2  . insulin NPH-regular Human (70-30) 100 UNIT/ML injection Inject 80-100 Units into the skin 2 (two) times a day. Sliding Scale 200-300=100 units  Less than 100=80    . metFORMIN (GLUCOPHAGE) 500 MG tablet Take 500 mg by mouth 2 (two) times daily.    . nitroGLYCERIN (NITROSTAT) 0.4 MG SL tablet Place 1 tablet (0.4 mg total) under the tongue every 5 (five) minutes x 3 doses as needed for chest pain (if no relief after 3rd dose, proceed to the ED for an evaluation or call 911). 25 tablet 3  . omeprazole (PRILOSEC) 20 MG capsule Take 20 mg by mouth every morning.    . sertraline (ZOLOFT) 100 MG tablet Take 150 mg by mouth daily.    . isosorbide mononitrate (IMDUR) 30 MG 24 hr tablet Take 1 tablet (30 mg total) by mouth daily. 90 tablet 3  . lisinopril (ZESTRIL) 20 MG tablet Take 1 tablet (20 mg total) by mouth daily. 90 tablet 3   No current facility-administered medications for this visit.   Allergies:  Patient has no known allergies.   Social History: The patient  reports that she has never smoked. She has never used smokeless tobacco. She reports that she does not drink  alcohol or use drugs.   Family History: The patient's family history includes Alcohol abuse in her father and mother; Anxiety disorder in her father and sister; Depression in her cousin, father, maternal aunt, and sister.   ROS:  Please see the history of present illness. Otherwise, complete review of systems is positive for none.  All other systems are reviewed and negative.   Physical Exam: VS:  BP (!) 188/90   Pulse 90   Ht 4\' 11"  (1.499 m)   Wt 191 lb (86.6 kg)   SpO2 96%   BMI 38.58 kg/m , BMI Body mass  index is 38.58 kg/m.  Wt Readings from Last 3 Encounters:  02/18/20 191 lb (86.6 kg)  07/05/19 192 lb 9.6 oz (87.4 kg)  05/25/19 185 lb 4.8 oz (84.1 kg)    General: Patient appears comfortable at rest. HEENT: Conjunctiva and lids normal, oropharynx clear with moist mucosa. Neck: Supple, no elevated JVP or carotid bruits, no thyromegaly. Lungs: Clear to auscultation, nonlabored breathing at rest. Cardiac: Regular rate and rhythm, no S3 or significant systolic murmur, no pericardial rub. Abdomen: Soft, nontender, no hepatomegaly, bowel sounds present, no guarding or rebound. Extremities: No pitting edema, distal pulses 2+. Skin: Warm and dry. Musculoskeletal: No kyphosis. Neuropsychiatric: Alert and oriented x3, affect grossly appropriate.  ECG:  None today  Recent Labwork: 05/25/2019: BUN 22; Creatinine, Ser 0.95; Hemoglobin 11.5; Platelets 210; Potassium 4.1; Sodium 135     Component Value Date/Time   CHOL 231 (H) 09/23/2015 1215   TRIG 737 (H) 09/23/2015 1215   HDL 25 (L) 09/23/2015 1215   CHOLHDL 9.2 (H) 09/23/2015 1215   VLDL NOT CALC 09/23/2015 1215   LDLCALC NOT CALC 09/23/2015 1215   LDLDIRECT 70 09/23/2015 1215    Other Studies Reviewed Today:  Echocardiogram 06/14/2019 1. The left ventricle has normal systolic function, with an ejection  fraction of 55-60%. The cavity size was normal. Left ventricular diastolic  parameters were normal.  2. The right  ventricle has normal systolic function. The cavity was  normal. There is no increase in right ventricular wall thickness. Right  ventricular systolic pressure is normal with an estimated pressure of 10.7  mmHg.  3. The mitral valve is grossly normal.  4. The tricuspid valve is grossly normal.  5. The aortic valve is tricuspid.  6. The aorta is normal in size and structure.  7. The inferior vena cava was normal in size with <50% respiratory  variability.    Cardiac catheterization 05/24/2019  Prox LAD to Mid LAD lesion is 95% stenosed.  Mid LAD lesion is 90% stenosed.  Prox Cx lesion is 95% stenosed.  Prox Cx to Mid Cx lesion is 90% stenosed.  Prox RCA lesion is 30% stenosed.  2nd RPL lesion is 80% stenosed.  RPDA-2 lesion is 80% stenosed.  Previously placed RPDA-1 stent (unknown type) is widely patent.  A drug-eluting stent was successfully placed using a STENT SYNERGY DES 2.25X28.  Post intervention, there is a 0% residual stenosis.  Post intervention, there is a 0% residual stenosis.  There is mild left ventricular systolic dysfunction.  LV end diastolic pressure is normal.  The left ventricular ejection fraction is 50-55% by visual estimate.   Assessment and Plan:  1. Chest pain, unspecified type   2. CAD in native artery   3. Essential hypertension   4. Mixed hyperlipidemia   5. Medication management    1. Chest pain, unspecified type Recent complaints of chest pain via telephone encounter on 02/13/2020.  She was advised to go to the emergency room.  She presented to [facility] ED and was ruled out for acute coronary syndrome.  No ischemic EKG changes.  Troponins were negative x4.  Negative chest x-ray for acute cardiopulmonary pathology.  She was given aspirin and nitroglycerin.  Start Imdur 30 mg p.o. daily.  Patient now states she was only having chest tightness but no chest pain.  States she does have some mild dyspnea on exertion.  She attributes  some of the chest pain to significant anxiety issues.  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 CAD in native artery History of STEMI in 2016 with DES to PDA x2.  May 25, 2019 had a cath for progressive angina with DES to LAD with medical treatment for PDA/PL disease.  Patient states she has mild chest tightness during activity but it is quickly relieved when resting.  3. Essential hypertension Blood pressure elevated today at 188/90.  Recent blood pressure at ED visit at [facility] on 02/13/2020 was 166/80.  Increase lisinopril to 20 mg daily.  Get a BMP in 2 weeks after increasing lisinopril dose.  Patient states her blood pressure cuff is broke.  Advised her to get a new blood pressure cuff to measure her pressures at home.  4. HLD Most recent lipid panel 05/08/2019 at PCP office showed a total cholesterol of 321, HDL of 30, triglycerides 572, LDL cholesterol was not able to be calculated due to triglyceride levels greater than 400.  Non-HDL cholesterol 291.  Get a fasting lipid panel with direct LDL and LFTs.  Continue atorvastatin high intensity 80 mg daily      Medication Adjustments/Labs and Tests Ordered: Current medicines are reviewed at length with the patient today.  Concerns regarding medicines are outlined above.   Disposition: Follow-up with Dr. Gwenlyn Found or APP in 1 month  Signed, Levell July, NP 02/18/2020 4:55 PM    [facility] at [place], [place], [place] [address] Phone: [phone]; Fax: [phone]

## 2020-03-19 ENCOUNTER — Telehealth: Payer: Self-pay | Admitting: Family Medicine

## 2020-03-19 NOTE — Telephone Encounter (Signed)
Did not do lab work - need to know about what labs need to be done and have orders sent

## 2020-03-19 NOTE — Telephone Encounter (Signed)
Patient informed that she was given fasting lab work orders at her last visit and she can have this done at [place]:  Your physician recommends that you return for non-fasting lab work in 2 weeks to check your BMET, Direct LDL and LFT's. This may be done at [place] from 8:00 am to 4:00 pm.  Verbalized understanding.

## 2020-03-20 ENCOUNTER — Ambulatory Visit: Payer: BC Managed Care – PPO | Admitting: Family Medicine

## 2020-03-23 NOTE — Progress Notes (Deleted)
Cardiology Office Note  Date: 03/23/2020   ID: [name], DOB January 23, 1966, MRN [id number]  PCP:  Amy Haste, NP  Cardiologist:  Amy Dolly, MD Electrophysiologist:  None   Chief Complaint: ED follow-up chest pain, CAD (DES to PDA x 2, 2016) HLD, HTN, DN   History of Present Illness:  Amy Snow is a 54 y.o. female with a history of STEMI 2016 status post DES x2 PDA with medical treatment for residual disease.  HTN, HLD, GERD, DM, fibromyalgia, mild anemia, anxiety.  May 15, 2019 she had an office visit concerning for progressive angina.  She was admitted May 25, 2019 for outpatient cath and had a DES to LAD with medical treatment for PDA/PL disease.  EF was 50 to 55% at cath and 55 to 60% by echo.  Patient had a follow-up visit with APP Amy Ferries, PA July 05, 2019 stating she felt much better after she had stent placed.  She recently had a telephone call 02/13/2020 with Amy Snow, CMA here at the clinic stating she was having some shortness of breath, chest tightness, which started over the weekend and this relieved her pain.  She stated she was able to sleep and went to work still complaining of chest tightness and squeezing feeling in her chest complaining of hard to take a deep breath.  She was advised to go to the [facility] emergency room for evaluation.  She presented to [facility] ED on the same day 02/13/2020 for these complaints.  She was ruled out for MI / acute coronary syndrome and discharged home.  Her troponins were negative x4.  No EKG changes indicating ischemia.  Her blood pressure was elevated at 166/80.  She had a negative chest x-ray except for low lung volumes, otherwise no acute cardiopulmonary abnormality.  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  She did describe the chest pain as "kind of like" when she had previous heart attacks.  At last visit she described mild chest tightness  and mild dyspnea when exerting during work hours.  She denied any radiation, or associated nausea, vomiting, or diaphoresis.  She she stated she had a significant amount of anxiety.  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  Stated she takes sertraline for depression but she still has a significant amount of anxiety. Blood pressure was elevated  at 188/90.  Her BP was elevated at recent ED visit at 166/80. Her Lisinopril was increased to 20 mg daily. Imdur was started d/t chest tightness.   Past Medical History:  Diagnosis Date  . Anxiety and depression   . CAD (coronary artery disease)    a. s/p STEMI 2016 s/p DES x 2 PDA with residual dz treated medically. b. [place] in 2020 with cath s/p DES to long high grade lesion in LAD, residual disease treated medically, EF 50%.  . Diabetes mellitus, type II (Amy Snow)   . Fibromyalgia   . GERD (gastroesophageal reflux disease)   . Hyperlipidemia   . Hypertension   . Ischemic cardiomyopathy    a. EF <25% in 2016, improved to normal thereafter. b. EF 50% in 05/2019.  Amy Snow Myocardial infarct [facility])    august 2016    Past Surgical History:  Procedure Laterality Date  . CARDIAC CATHETERIZATION N/A 07/05/2015   Procedure: Left Heart Cath and Coronary Angiography;  Surgeon: Lorretta Harp,  MD;  Location: [place] CV LAB;  Service: Cardiovascular;  Laterality: N/A;  . CESAREAN SECTION    . CORONARY STENT INTERVENTION N/A 05/24/2019   Procedure: CORONARY STENT INTERVENTION;  Surgeon: Lorretta Harp, MD;  Location: [place] CV LAB;  Service: Cardiovascular;  Laterality: N/A;  . HERNIA REPAIR    . LEFT HEART CATH AND CORONARY ANGIOGRAPHY N/A 05/24/2019   Procedure: LEFT HEART CATH AND CORONARY ANGIOGRAPHY;  Surgeon: Lorretta Harp, MD;  Location: [place] CV LAB;  Service: Cardiovascular;  Laterality: N/A;  . TONSILLECTOMY    . TUBAL LIGATION      Current Outpatient Medications  Medication Sig  Dispense Refill  . acetaminophen (TYLENOL) 650 MG CR tablet Take 1,300 mg by mouth as needed.     Amy Snow aspirin 81 MG chewable tablet Chew 1 tablet (81 mg total) by mouth daily. 90 tablet 3  . atorvastatin (LIPITOR) 80 MG tablet TAKE 1 TABLET BY MOUTH ONCE DAILY AT  6  PM 90 tablet 3  . carvedilol (COREG) 12.5 MG tablet Take 1 tablet (12.5 mg total) by mouth 2 (two) times daily. 180 tablet 2  . Cholecalciferol (VITAMIN D3) 1.25 MG (50000 UT) CAPS Take 50,000 Units by mouth every Wednesday. In the morning.    . clopidogrel (PLAVIX) 75 MG tablet Take 75 mg by mouth daily.    . DULoxetine (CYMBALTA) 20 MG capsule Take 40 mg by mouth 2 (two) times daily.     . hydrochlorothiazide (MICROZIDE) 12.5 MG capsule Take 1 capsule (12.5 mg total) by mouth daily. May take an extra tablet daily as needed for weight gain of 3 lbs in one day or 5 lbs in one week 180 capsule 2  . insulin NPH-regular Human (70-30) 100 UNIT/ML injection Inject 80-100 Units into the skin 2 (two) times a day. Sliding Scale 200-300=100 units  Less than 100=80    . isosorbide mononitrate (IMDUR) 30 MG 24 hr tablet Take 1 tablet (30 mg total) by mouth daily. 90 tablet 3  . lisinopril (ZESTRIL) 20 MG tablet Take 1 tablet (20 mg total) by mouth daily. 90 tablet 3  . metFORMIN (GLUCOPHAGE) 500 MG tablet Take 500 mg by mouth 2 (two) times daily.    . nitroGLYCERIN (NITROSTAT) 0.4 MG SL tablet Place 1 tablet (0.4 mg total) under the tongue every 5 (five) minutes x 3 doses as needed for chest pain (if no relief after 3rd dose, proceed to the ED for an evaluation or call 911). 25 tablet 3  . omeprazole (PRILOSEC) 20 MG capsule Take 20 mg by mouth every morning.    . sertraline (ZOLOFT) 100 MG tablet Take 150 mg by mouth daily.     No current facility-administered medications for this visit.   Allergies:  Patient has no known allergies.   Social History: The patient  reports that she has never smoked. She has never used smokeless tobacco. She  reports that she does not drink alcohol or use drugs.   Family History: The patient's family history includes Alcohol abuse in her father and mother; Anxiety disorder in her father and sister; Depression in her cousin, father, maternal aunt, and sister.   ROS:  Please see the history of present illness. Otherwise, complete review of systems is positive for none.  All other systems are reviewed and negative.   Physical Exam: VS:  There were no vitals taken for this visit., BMI There is no height or weight on file to calculate BMI.  Wt  Readings from Last 3 Encounters:  02/18/20 191 lb (86.6 kg)  07/05/19 192 lb 9.6 oz (87.4 kg)  05/25/19 185 lb 4.8 oz (84.1 kg)    General: Patient appears comfortable at rest. HEENT: Conjunctiva and lids normal, oropharynx clear with moist mucosa. Neck: Supple, no elevated JVP or carotid bruits, no thyromegaly. Lungs: Clear to auscultation, nonlabored breathing at rest. Cardiac: Regular rate and rhythm, no S3 or significant systolic murmur, no pericardial rub. Abdomen: Soft, nontender, no hepatomegaly, bowel sounds present, no guarding or rebound. Extremities: No pitting edema, distal pulses 2+. Skin: Warm and dry. Musculoskeletal: No kyphosis. Neuropsychiatric: Alert and oriented x3, affect grossly appropriate.  ECG:  None today  Recent Labwork: 05/25/2019: BUN 22; Creatinine, Ser 0.95; Hemoglobin 11.5; Platelets 210; Potassium 4.1; Sodium 135     Component Value Date/Time   CHOL 231 (H) 09/23/2015 1215   TRIG 737 (H) 09/23/2015 1215   HDL 25 (L) 09/23/2015 1215   CHOLHDL 9.2 (H) 09/23/2015 1215   VLDL NOT CALC 09/23/2015 1215   LDLCALC NOT CALC 09/23/2015 1215   LDLDIRECT 70 09/23/2015 1215    Other Studies Reviewed Today:  Echocardiogram 06/14/2019 1. The left ventricle has normal systolic function, with an ejection  fraction of 55-60%. The cavity size was normal. Left ventricular diastolic  parameters were normal.  2. The right ventricle  has normal systolic function. The cavity was  normal. There is no increase in right ventricular wall thickness. Right  ventricular systolic pressure is normal with an estimated pressure of 10.7  mmHg.  3. The mitral valve is grossly normal.  4. The tricuspid valve is grossly normal.  5. The aortic valve is tricuspid.  6. The aorta is normal in size and structure.  7. The inferior vena cava was normal in size with <50% respiratory  variability.    Cardiac catheterization 05/24/2019  Prox LAD to Mid LAD lesion is 95% stenosed.  Mid LAD lesion is 90% stenosed.  Prox Cx lesion is 95% stenosed.  Prox Cx to Mid Cx lesion is 90% stenosed.  Prox RCA lesion is 30% stenosed.  2nd RPL lesion is 80% stenosed.  RPDA-2 lesion is 80% stenosed.  Previously placed RPDA-1 stent (unknown type) is widely patent.  A drug-eluting stent was successfully placed using a STENT SYNERGY DES 2.25X28.  Post intervention, there is a 0% residual stenosis.  Post intervention, there is a 0% residual stenosis.  There is mild left ventricular systolic dysfunction.  LV end diastolic pressure is normal.  The left ventricular ejection fraction is 50-55% by visual estimate.   Assessment and Plan:  1. Chest pain, unspecified type Recent complaints of chest pain via telephone encounter on 02/13/2020.  She was advised to go to the emergency room.  She presented to [facility] ED and was ruled out for acute coronary syndrome.  No ischemic EKG changes.  Troponins were negative x4.  Negative chest x-ray for acute cardiopulmonary pathology.  She was given aspirin and nitroglycerin.  Start Imdur 30 mg p.o. daily.  Patient now states she was only having chest tightness but no chest pain.  States she does have some mild dyspnea on exertion.  She attributes some of the chest pain to significant anxiety issues.  She is significantly deconditioned but is active during her workdays dealing with children at the daycare  center  2. CAD in native artery History of STEMI in 2016 with DES to PDA x 2. May 25, 2019 had a cath for progressive angina with  DES to LAD with medical treatment for PDA/PL disease.  Patient states she has mild chest tightness during activity but it is quickly relieved when resting.  3. Essential hypertension Blood pressure elevated today at 188/90.  Recent blood pressure at ED visit at [facility] on 02/13/2020 was 166/80.  Increase lisinopril to 20 mg daily.  Get a BMP in 2 weeks after increasing lisinopril dose.  Patient states her blood pressure cuff is broke.  Advised her to get a new blood pressure cuff to measure her pressures at home.  4. HLD Most recent lipid panel 05/08/2019 at PCP office showed a total cholesterol of 321, HDL of 30, triglycerides 572, LDL cholesterol was not able to be calculated due to triglyceride levels greater than 400.  Non-HDL cholesterol 291.  Get a fasting lipid panel with direct LDL and LFTs.  Continue atorvastatin high intensity 80 mg daily   Medication Adjustments/Labs and Tests Ordered: Current medicines are reviewed at length with the patient today.  Concerns regarding medicines are outlined above.   Disposition: Follow-up with Dr Harl Bowie or APP  Signed, Levell July, NP 03/23/2020 10:19 PM    [facility] at [place], [place], [place] [address] Phone: [phone]; Fax: [phone]

## 2020-03-24 ENCOUNTER — Ambulatory Visit: Payer: BC Managed Care – PPO | Admitting: Family Medicine

## 2020-03-28 ENCOUNTER — Other Ambulatory Visit: Payer: Self-pay | Admitting: *Deleted

## 2020-03-28 MED ORDER — CLOPIDOGREL BISULFATE 75 MG PO TABS: 75.0000 mg | ORAL_TABLET | Freq: Every day | ORAL | 3 refills | Status: DC

## 2020-04-08 DIAGNOSIS — G932 Benign intracranial hypertension: Secondary | ICD-10-CM | POA: Diagnosis not present

## 2020-04-08 DIAGNOSIS — H53453 Other localized visual field defect, bilateral: Secondary | ICD-10-CM | POA: Diagnosis not present

## 2020-04-08 DIAGNOSIS — H3589 Other specified retinal disorders: Secondary | ICD-10-CM | POA: Diagnosis not present

## 2020-04-08 DIAGNOSIS — H47293 Other optic atrophy, bilateral: Secondary | ICD-10-CM | POA: Diagnosis not present

## 2020-04-08 DIAGNOSIS — H547 Unspecified visual loss: Secondary | ICD-10-CM | POA: Diagnosis not present

## 2020-05-08 DIAGNOSIS — E069 Thyroiditis, unspecified: Secondary | ICD-10-CM | POA: Diagnosis not present

## 2020-05-08 DIAGNOSIS — M797 Fibromyalgia: Secondary | ICD-10-CM | POA: Diagnosis not present

## 2020-05-08 DIAGNOSIS — I889 Nonspecific lymphadenitis, unspecified: Secondary | ICD-10-CM | POA: Diagnosis not present

## 2020-05-08 DIAGNOSIS — G4700 Insomnia, unspecified: Secondary | ICD-10-CM | POA: Diagnosis not present

## 2020-05-08 DIAGNOSIS — F411 Generalized anxiety disorder: Secondary | ICD-10-CM | POA: Diagnosis not present

## 2020-05-28 DIAGNOSIS — M25569 Pain in unspecified knee: Secondary | ICD-10-CM | POA: Diagnosis not present

## 2020-05-28 DIAGNOSIS — S8991XA Unspecified injury of right lower leg, initial encounter: Secondary | ICD-10-CM | POA: Diagnosis not present

## 2020-05-28 DIAGNOSIS — M25561 Pain in right knee: Secondary | ICD-10-CM | POA: Diagnosis not present

## 2020-06-09 ENCOUNTER — Other Ambulatory Visit: Payer: Self-pay | Admitting: *Deleted

## 2020-06-09 MED ORDER — CARVEDILOL 12.5 MG PO TABS: 12.5000 mg | ORAL_TABLET | Freq: Two times a day (BID) | ORAL | 1 refills | Status: DC

## 2020-06-17 DIAGNOSIS — M797 Fibromyalgia: Secondary | ICD-10-CM | POA: Diagnosis not present

## 2020-06-17 DIAGNOSIS — I889 Nonspecific lymphadenitis, unspecified: Secondary | ICD-10-CM | POA: Diagnosis not present

## 2020-07-10 ENCOUNTER — Ambulatory Visit (INDEPENDENT_AMBULATORY_CARE_PROVIDER_SITE_OTHER): Payer: BC Managed Care – PPO | Admitting: Gastroenterology

## 2020-07-14 ENCOUNTER — Ambulatory Visit (INDEPENDENT_AMBULATORY_CARE_PROVIDER_SITE_OTHER): Payer: BC Managed Care – PPO | Admitting: Gastroenterology

## 2020-08-11 ENCOUNTER — Ambulatory Visit (INDEPENDENT_AMBULATORY_CARE_PROVIDER_SITE_OTHER): Payer: 59 | Admitting: Gastroenterology

## 2020-08-11 ENCOUNTER — Other Ambulatory Visit: Payer: Self-pay

## 2020-08-11 ENCOUNTER — Encounter (INDEPENDENT_AMBULATORY_CARE_PROVIDER_SITE_OTHER): Payer: Self-pay | Admitting: *Deleted

## 2020-08-11 ENCOUNTER — Other Ambulatory Visit (INDEPENDENT_AMBULATORY_CARE_PROVIDER_SITE_OTHER): Payer: Self-pay | Admitting: *Deleted

## 2020-08-11 ENCOUNTER — Encounter (INDEPENDENT_AMBULATORY_CARE_PROVIDER_SITE_OTHER): Payer: Self-pay | Admitting: Gastroenterology

## 2020-08-11 DIAGNOSIS — K529 Noninfective gastroenteritis and colitis, unspecified: Secondary | ICD-10-CM

## 2020-08-11 DIAGNOSIS — Z1211 Encounter for screening for malignant neoplasm of colon: Secondary | ICD-10-CM

## 2020-08-11 DIAGNOSIS — Z1212 Encounter for screening for malignant neoplasm of rectum: Secondary | ICD-10-CM

## 2020-08-11 DIAGNOSIS — R1310 Dysphagia, unspecified: Secondary | ICD-10-CM | POA: Insufficient documentation

## 2020-08-11 DIAGNOSIS — K219 Gastro-esophageal reflux disease without esophagitis: Secondary | ICD-10-CM | POA: Diagnosis not present

## 2020-08-11 MED ORDER — OMEPRAZOLE 40 MG PO CPDR: 40.0000 mg | DELAYED_RELEASE_CAPSULE | Freq: Every day | ORAL | 3 refills | Status: DC

## 2020-08-11 NOTE — Progress Notes (Signed)
Amy Snow, M.D. Gastroenterology & Hepatology [facility] [address] [place], [place] [address] Primary Care Physician: Wyatt Haste, NP [place] [address]  Referring MD: PCP  I will communicate my assessment and recommendations to the referring MD via EMR. Note: Occasional unusual wording and randomly placed punctuation marks may result from the use of speech recognition technology to transcribe this document"  Chief Complaint: Dysphagia, neck tightness and diarrhea  History of Present Illness: Amy Snow is a 54 y.o. female with  history of STEMI 2016 status post DES x2 PDA with medical treatment for residual disease on DAPT, HTN, HLD, GERD, DM, fibromyalgia, mild anemia, anxiety who presents for evaluation of dysphagia or and diarrhea.  Patient reports that for the last 2 months she has felt "tightness in her neck" constantly. She denies any of these symptoms when sleeping. She also reports that when she eats solid food such as steak or chicken she feels that the food gets stuck in the lower area of her neck. She also reports that after drinking liquids such as milkshakes, she develops episodes of coughing. Never had choking but she reports that sometimes the coughing spells causes her to vomit. Sometimes she has some odynophagia.  Denies any previous episodes of food impaction.  The patient reports having heartburn for at least 10 years. Now she is having symptoms on a daily basis. She takes omeprazole 20 mg qday for many years, she has felt some relief with it but she still presenting some persistent discomfort. Has not tried Pepcid or Tums.  Notably, she also reports having episodes of loose BMs or slightly hard chronically for years. She usually has 4-6 BMs per day which she reports "is her normal number".  The patient denies having any nausea, vomiting, fever, chills, hematochezia, melena,  hematemesis, abdominal distention, abdominal pain,  jaundice, pruritus or weight loss.  Last ZLD:[id number] Last Colonoscopy:never  FHx: neg for any gastrointestinal/liver disease, mother lung cancer, breast cancer some family members. Social: neg smoking, alcohol or illicit drug use Surgical: numbilical hernia, tubal ligation  Past Medical History: Past Medical History:  Diagnosis Date  . Anxiety and depression   . CAD (coronary artery disease)    a. s/p STEMI 2016 s/p DES x 2 PDA with residual dz treated medically. b. [place] in 2020 with cath s/p DES to long high grade lesion in LAD, residual disease treated medically, EF 50%.  . Diabetes mellitus, type II ([place])   . Fibromyalgia   . GERD (gastroesophageal reflux disease)   . Hyperlipidemia   . Hypertension   . Ischemic cardiomyopathy    a. EF <25% in 2016, improved to normal thereafter. b. EF 50% in 05/2019.  Marland Kitchen Myocardial infarct [facility])    august 2016    Past Surgical History: Past Surgical History:  Procedure Laterality Date  . CARDIAC CATHETERIZATION N/A 07/05/2015   Procedure: Left Heart Cath and Coronary Angiography;  Surgeon: Lorretta Harp, MD;  Location: [place] [facility];  Service: Cardiovascular;  Laterality: N/A;  . CESAREAN SECTION    . CORONARY STENT INTERVENTION N/A 05/24/2019   Procedure: CORONARY STENT INTERVENTION;  Surgeon: Lorretta Harp, MD;  Location: [place] [facility];  Service: Cardiovascular;  Laterality: N/A;  . HERNIA REPAIR    . LEFT HEART CATH AND CORONARY ANGIOGRAPHY N/A 05/24/2019   Procedure: LEFT HEART CATH AND CORONARY ANGIOGRAPHY;  Surgeon: Lorretta Harp, MD;  Location: [facility]  INVASIVE CV LAB;  Service: Cardiovascular;  Laterality: N/A;  . TONSILLECTOMY    . TUBAL LIGATION      Family History: Family History  Problem Relation Age of Onset  . Alcohol abuse Mother   . Anxiety disorder Father   . Depression Father   . Alcohol abuse Father   . Anxiety disorder Sister   . Depression Sister   .  Depression Maternal Aunt   . Depression Cousin     Social History: Social History   Tobacco Use  Smoking Status Never Smoker  Smokeless Tobacco Never Used   Social History   Substance and Sexual Activity  Alcohol Use No   Comment: occasionally   Social History   Substance and Sexual Activity  Drug Use No    Allergies: No Known Allergies  Medications: Current Outpatient Medications  Medication Sig Dispense Refill  . acetaminophen (TYLENOL) 650 MG CR tablet Take 1,300 mg by mouth as needed.     Marland Kitchen Apple Cid Vn-Grn Tea-Bit Or-Cr (APPLE CIDER VINEGAR PLUS PO) Take 2 tablets by mouth 2 (two) times daily.    Marland Kitchen aspirin 81 MG chewable tablet Chew 1 tablet (81 mg total) by mouth daily. 90 tablet 3  . atorvastatin (LIPITOR) 80 MG tablet TAKE 1 TABLET BY MOUTH ONCE DAILY AT  6  PM 90 tablet 3  . carvedilol (COREG) 12.5 MG tablet Take 1 tablet (12.5 mg total) by mouth 2 (two) times daily. 180 tablet 1  . clopidogrel (PLAVIX) 75 MG tablet Take 1 tablet (75 mg total) by mouth daily. 90 tablet 3  . hydrochlorothiazide (MICROZIDE) 12.5 MG capsule Take 1 capsule (12.5 mg total) by mouth daily. May take an extra tablet daily as needed for weight gain of 3 lbs in one day or 5 lbs in one week 180 capsule 2  . insulin NPH-regular Human (70-30) 100 UNIT/ML injection Inject 80-100 Units into the skin 2 (two) times a day. Sliding Scale 200-300=100 units  Less than 100=80    . isosorbide mononitrate (IMDUR) 30 MG 24 hr tablet Take 1 tablet (30 mg total) by mouth daily. 90 tablet 3  . nitroGLYCERIN (NITROSTAT) 0.4 MG SL tablet Place 1 tablet (0.4 mg total) under the tongue every 5 (five) minutes x 3 doses as needed for chest pain (if no relief after 3rd dose, proceed to the ED for an evaluation or call 911). 25 tablet 3  . omeprazole (PRILOSEC) 20 MG capsule Take 20 mg by mouth every morning.    . sertraline (ZOLOFT) 100 MG tablet Take 150 mg by mouth daily.    Marland Kitchen topiramate (TOPAMAX) 25 MG capsule  Take 25 mg by mouth 2 (two) times daily.    . traZODone (DESYREL) 50 MG tablet Take 50 mg by mouth at bedtime.    Marland Kitchen lisinopril (ZESTRIL) 20 MG tablet Take 1 tablet (20 mg total) by mouth daily. 90 tablet 3  . metFORMIN (GLUCOPHAGE) 500 MG tablet Take 500 mg by mouth 2 (two) times daily. (Patient not taking: Reported on 08/11/2020)     No current facility-administered medications for this visit.    Review of Systems: GENERAL: negative for malaise, night sweats HEENT: No changes in hearing or vision, no nose bleeds or other nasal problems. NECK: Negative for lumps, goiter, pain and significant neck swelling RESPIRATORY: Negative for cough, wheezing CARDIOVASCULAR: Negative for chest pain, leg swelling, palpitations, orthopnea GI: SEE HPI MUSCULOSKELETAL: Negative for joint pain or swelling, back pain, and muscle pain. SKIN: Negative for  lesions, rash PSYCH: Negative for sleep disturbance, mood disorder and recent psychosocial stressors. HEMATOLOGY Negative for prolonged bleeding, bruising easily, and swollen nodes. ENDOCRINE: Negative for cold or heat intolerance, polyuria, polydipsia and goiter. NEURO: negative for tremor, gait imbalance, syncope and seizures. The remainder of the review of systems is noncontributory.   Physical Exam: BP 136/85 (BP Location: Right Arm, Patient Position: Sitting, Cuff Size: Normal)   Pulse 78   Temp (!) 97 F (36.1 C) (Oral)   Ht 4\' 11"  (1.499 m)   Wt 191 lb (86.6 kg)   BMI 38.58 kg/m  GENERAL: The patient is AO x3, in no acute distress. HEENT: Head is normocephalic and atraumatic. EOMI are intact. Mouth is well hydrated and without lesions. NECK: Supple. No masses LUNGS: Clear to auscultation. No presence of rhonchi/wheezing/rales. Adequate chest expansion HEART: RRR, normal s1 and s2. ABDOMEN: mildly tender in the epigastric area, no guarding, no peritoneal signs, and nondistended. BS +. No masses. EXTREMITIES: Without any cyanosis, clubbing,  rash, lesions or edema. NEUROLOGIC: AOx3, no focal motor deficit. SKIN: no jaundice, no rashes   Imaging/Labs: as above  I personally reviewed and interpreted the available labs, imaging and endoscopic files.  Impression and Plan: Amy Snow is a 54 y.o. female with  history of STEMI 2016 status post DES x2 PDA with medical treatment for residual disease on DAPT, HTN, HLD, GERD, DM, fibromyalgia, mild anemia, anxiety who presents for evaluation of dysphagia or and diarrhea.  The patient has had new onset symptoms of dysphagia of unclear etiology, although she has a history of GERD which increases risk for peptic strictures.  We will proceed with an EGD with possible esophageal dilation.  As she is presenting persistent heartburn despite taking low-dose PPI, omeprazole will be increased to 40 mg every day.  Patient was counseled on the right way to take the medication.  She is also presenting significant amount of loose to watery bowel movements.  She has never had a colonoscopy in the past, for which we will proceed with a colonoscopy with random colonic biopsies.  Small bowel biopsies will also be obtained with an EGD to rule out celiac disease.  Nevertheless, given the chronicity of her symptoms I suspect her symptoms could be related to bowel hypersensitivity, which could explain her dysphagia and neck discomfort.  Patient understood and agreed.  - Schedule EGD and colonoscopy with SB and random colon biopsies - will obtain clearance to stop Plavix 5 days prior to procedure - Increase omeprazole 40 mg qday - Explained presumed etiology of reflux symptoms. Instruction provided in the use of antireflux medication - patient should take medication in the morning 30-45 minutes before eating breakfast. Discussed avoidance of eating within 2 hours of lying down to sleep and benefit of blocks to elevate head of bed.  All questions were answered.      Amy Peppers, MD Gastroenterology and  Hepatology [facility]

## 2020-08-11 NOTE — Patient Instructions (Signed)
Schedule EGD and colonoscopy- will obtain clearance to stop Plavix 5 days prior to procedure Increase omeprazole 40 mg qday

## 2020-08-11 NOTE — H&P (View-Only) (Signed)
Amy Snow, M.D. Gastroenterology & Hepatology [facility] [address] [place], [place] [address] Primary Care Physician: Wyatt Haste, NP [place] [address]  Referring MD: PCP  I will communicate my assessment and recommendations to the referring MD via EMR. Note: Occasional unusual wording and randomly placed punctuation marks may result from the use of speech recognition technology to transcribe this document"  Chief Complaint: Dysphagia, neck tightness and diarrhea  History of Present Illness: Amy Snow is a 54 y.o. female with  history of STEMI 2016 status post DES x2 PDA with medical treatment for residual disease on DAPT, HTN, HLD, GERD, DM, fibromyalgia, mild anemia, anxiety who presents for evaluation of dysphagia or and diarrhea.  Patient reports that for the last 2 months she has felt "tightness in her neck" constantly. She denies any of these symptoms when sleeping. She also reports that when she eats solid food such as steak or chicken she feels that the food gets stuck in the lower area of her neck. She also reports that after drinking liquids such as milkshakes, she develops episodes of coughing. Never had choking but she reports that sometimes the coughing spells causes her to vomit. Sometimes she has some odynophagia.  Denies any previous episodes of food impaction.  The patient reports having heartburn for at least 10 years. Now she is having symptoms on a daily basis. She takes omeprazole 20 mg qday for many years, she has felt some relief with it but she still presenting some persistent discomfort. Has not tried Pepcid or Tums.  Notably, she also reports having episodes of loose BMs or slightly hard chronically for years. She usually has 4-6 BMs per day which she reports "is her normal number".  The patient denies having any nausea, vomiting, fever, chills, hematochezia, melena,  hematemesis, abdominal distention, abdominal pain,  jaundice, pruritus or weight loss.  Last TGG:YIRSW Last Colonoscopy:never  FHx: neg for any gastrointestinal/liver disease, mother lung cancer, breast cancer some family members. Social: neg smoking, alcohol or illicit drug use Surgical: numbilical hernia, tubal ligation  Past Medical History: Past Medical History:  Diagnosis Date  . Anxiety and depression   . CAD (coronary artery disease)    a. s/p STEMI 2016 s/p DES x 2 PDA with residual dz treated medically. b. [place] in 2020 with cath s/p DES to long high grade lesion in LAD, residual disease treated medically, EF 50%.  . Diabetes mellitus, type II ([place])   . Fibromyalgia   . GERD (gastroesophageal reflux disease)   . Hyperlipidemia   . Hypertension   . Ischemic cardiomyopathy    a. EF <25% in 2016, improved to normal thereafter. b. EF 50% in 05/2019.  Marland Kitchen Myocardial infarct [facility])    august 2016    Past Surgical History: Past Surgical History:  Procedure Laterality Date  . CARDIAC CATHETERIZATION N/A 07/05/2015   Procedure: Left Heart Cath and Coronary Angiography;  Surgeon: Lorretta Harp, MD;  Location: [place] [facility];  Service: Cardiovascular;  Laterality: N/A;  . CESAREAN SECTION    . CORONARY STENT INTERVENTION N/A 05/24/2019   Procedure: CORONARY STENT INTERVENTION;  Surgeon: Lorretta Harp, MD;  Location: [place] [facility];  Service: Cardiovascular;  Laterality: N/A;  . HERNIA REPAIR    . LEFT HEART CATH AND CORONARY ANGIOGRAPHY N/A 05/24/2019   Procedure: LEFT HEART CATH AND CORONARY ANGIOGRAPHY;  Surgeon: Lorretta Harp, MD;  [facility]  INVASIVE CV LAB;  Service: Cardiovascular;  Laterality: N/A;  . TONSILLECTOMY    . TUBAL LIGATION      Family History: Family History  Problem Relation Age of Onset  . Alcohol abuse Mother   . Anxiety disorder Father   . Depression Father   . Alcohol abuse Father   . Anxiety disorder Sister   . Depression Sister   .  Depression Maternal Aunt   . Depression Cousin     Social History: Social History   Tobacco Use  Smoking Status Never Smoker  Smokeless Tobacco Never Used   Social History   Substance and Sexual Activity  Alcohol Use No   Comment: occasionally   Social History   Substance and Sexual Activity  Drug Use No    Allergies: No Known Allergies  Medications: Current Outpatient Medications  Medication Sig Dispense Refill  . acetaminophen (TYLENOL) 650 MG CR tablet Take 1,300 mg by mouth as needed.     Marland Kitchen Apple Cid Vn-Grn Tea-Bit Or-Cr (APPLE CIDER VINEGAR PLUS PO) Take 2 tablets by mouth 2 (two) times daily.    Marland Kitchen aspirin 81 MG chewable tablet Chew 1 tablet (81 mg total) by mouth daily. 90 tablet 3  . atorvastatin (LIPITOR) 80 MG tablet TAKE 1 TABLET BY MOUTH ONCE DAILY AT  6  PM 90 tablet 3  . carvedilol (COREG) 12.5 MG tablet Take 1 tablet (12.5 mg total) by mouth 2 (two) times daily. 180 tablet 1  . clopidogrel (PLAVIX) 75 MG tablet Take 1 tablet (75 mg total) by mouth daily. 90 tablet 3  . hydrochlorothiazide (MICROZIDE) 12.5 MG capsule Take 1 capsule (12.5 mg total) by mouth daily. May take an extra tablet daily as needed for weight gain of 3 lbs in one day or 5 lbs in one week 180 capsule 2  . insulin NPH-regular Human (70-30) 100 UNIT/ML injection Inject 80-100 Units into the skin 2 (two) times a day. Sliding Scale 200-300=100 units  Less than 100=80    . isosorbide mononitrate (IMDUR) 30 MG 24 hr tablet Take 1 tablet (30 mg total) by mouth daily. 90 tablet 3  . nitroGLYCERIN (NITROSTAT) 0.4 MG SL tablet Place 1 tablet (0.4 mg total) under the tongue every 5 (five) minutes x 3 doses as needed for chest pain (if no relief after 3rd dose, proceed to the ED for an evaluation or call 911). 25 tablet 3  . omeprazole (PRILOSEC) 20 MG capsule Take 20 mg by mouth every morning.    . sertraline (ZOLOFT) 100 MG tablet Take 150 mg by mouth daily.    Marland Kitchen topiramate (TOPAMAX) 25 MG capsule  Take 25 mg by mouth 2 (two) times daily.    . traZODone (DESYREL) 50 MG tablet Take 50 mg by mouth at bedtime.    Marland Kitchen lisinopril (ZESTRIL) 20 MG tablet Take 1 tablet (20 mg total) by mouth daily. 90 tablet 3  . metFORMIN (GLUCOPHAGE) 500 MG tablet Take 500 mg by mouth 2 (two) times daily. (Patient not taking: Reported on 08/11/2020)     No current facility-administered medications for this visit.    Review of Systems: GENERAL: negative for malaise, night sweats HEENT: No changes in hearing or vision, no nose bleeds or other nasal problems. NECK: Negative for lumps, goiter, pain and significant neck swelling RESPIRATORY: Negative for cough, wheezing CARDIOVASCULAR: Negative for chest pain, leg swelling, palpitations, orthopnea GI: SEE HPI MUSCULOSKELETAL: Negative for joint pain or swelling, back pain, and muscle pain. SKIN: Negative for  lesions, rash PSYCH: Negative for sleep disturbance, mood disorder and recent psychosocial stressors. HEMATOLOGY Negative for prolonged bleeding, bruising easily, and swollen nodes. ENDOCRINE: Negative for cold or heat intolerance, polyuria, polydipsia and goiter. NEURO: negative for tremor, gait imbalance, syncope and seizures. The remainder of the review of systems is noncontributory.   Physical Exam: BP 136/85 (BP Location: Right Arm, Patient Position: Sitting, Cuff Size: Normal)   Pulse 78   Temp (!) 97 F (36.1 C) (Oral)   Ht 4\' 11"  (1.499 m)   Wt 191 lb (86.6 kg)   BMI 38.58 kg/m  GENERAL: The patient is AO x3, in no acute distress. HEENT: Head is normocephalic and atraumatic. EOMI are intact. Mouth is well hydrated and without lesions. NECK: Supple. No masses LUNGS: Clear to auscultation. No presence of rhonchi/wheezing/rales. Adequate chest expansion HEART: RRR, normal s1 and s2. ABDOMEN: mildly tender in the epigastric area, no guarding, no peritoneal signs, and nondistended. BS +. No masses. EXTREMITIES: Without any cyanosis, clubbing,  rash, lesions or edema. NEUROLOGIC: AOx3, no focal motor deficit. SKIN: no jaundice, no rashes   Imaging/Labs: as above  I personally reviewed and interpreted the available labs, imaging and endoscopic files.  Impression and Plan: Amy Snow is a 54 y.o. female with  history of STEMI 2016 status post DES x2 PDA with medical treatment for residual disease on DAPT, HTN, HLD, GERD, DM, fibromyalgia, mild anemia, anxiety who presents for evaluation of dysphagia or and diarrhea.  The patient has had new onset symptoms of dysphagia of unclear etiology, although she has a history of GERD which increases risk for peptic strictures.  We will proceed with an EGD with possible esophageal dilation.  As she is presenting persistent heartburn despite taking low-dose PPI, omeprazole will be increased to 40 mg every day.  Patient was counseled on the right way to take the medication.  She is also presenting significant amount of loose to watery bowel movements.  She has never had a colonoscopy in the past, for which we will proceed with a colonoscopy with random colonic biopsies.  Small bowel biopsies will also be obtained with an EGD to rule out celiac disease.  Nevertheless, given the chronicity of her symptoms I suspect her symptoms could be related to bowel hypersensitivity, which could explain her dysphagia and neck discomfort.  Patient understood and agreed.  - Schedule EGD and colonoscopy with SB and random colon biopsies - will obtain clearance to stop Plavix 5 days prior to procedure - Increase omeprazole 40 mg qday - Explained presumed etiology of reflux symptoms. Instruction provided in the use of antireflux medication - patient should take medication in the morning 30-45 minutes before eating breakfast. Discussed avoidance of eating within 2 hours of lying down to sleep and benefit of blocks to elevate head of bed.  All questions were answered.      Amy Peppers, MD Gastroenterology and  Hepatology [facility]

## 2020-08-12 ENCOUNTER — Telehealth (INDEPENDENT_AMBULATORY_CARE_PROVIDER_SITE_OTHER): Payer: Self-pay | Admitting: *Deleted

## 2020-08-12 NOTE — Telephone Encounter (Signed)
Ok to hold plavix as needed   J Brookelin Felber MD

## 2020-08-12 NOTE — Patient Instructions (Addendum)
Your procedure is scheduled on: 08/15/2020  Report to [facility] at  11:00   AM.  Call this number if you have problems the morning of surgery: [phone]   Remember:              Follow Directions on the letter you received from Your Physician's office regarding the Bowel Prep              No Smoking the day of Procedure :   Take these medicines the morning of surgery with A SIP OF WATER:Carvedilol,  Isosorbide, Zoloft              Take only 1/2 dose of insulin 70/30 40 units the night before your procedure                      No diabetic medications the am of your procedure   Do not wear jewelry, make-up or nail polish.    Do not bring valuables to the hospital.  Contacts, dentures or bridgework may not be worn into surgery.  .   Patients discharged the day of surgery will not be allowed to drive home.     Colonoscopy, Adult, Care After This sheet gives you information about how to care for yourself after your procedure. Your health care provider may also give you more specific instructions. If you have problems or questions, contact your health care provider. What can I expect after the procedure? After the procedure, it is common to have:  A small amount of blood in your stool for 24 hours after the procedure.  Some gas.  Mild abdominal cramping or bloating.  Follow these instructions at home: General instructions   For the first 24 hours after the procedure: ? Do not drive or use machinery. ? Do not sign important documents. ? Do not drink alcohol. ? Do your regular daily activities at a slower pace than normal. ? Eat soft, easy-to-digest foods. ? Rest often.  Take over-the-counter or prescription medicines only as told by your health care provider.  It is up to you to get the results of your procedure. Ask your health care provider, or the department performing the procedure, when your results will be ready. Relieving cramping and bloating  Try walking  around when you have cramps or feel bloated.  Apply heat to your abdomen as told by your health care provider. Use a heat source that your health care provider recommends, such as a moist heat pack or a heating pad. ? Place a towel between your skin and the heat source. ? Leave the heat on for 20-30 minutes. ? Remove the heat if your skin turns bright red. This is especially important if you are unable to feel pain, heat, or cold. You may have a greater risk of getting burned. Eating and drinking  Drink enough fluid to keep your urine clear or pale yellow.  Resume your normal diet as instructed by your health care provider. Avoid heavy or fried foods that are hard to digest.  Avoid drinking alcohol for as long as instructed by your health care provider. Contact a health care provider if:  You have blood in your stool 2-3 days after the procedure. Get help right away if:  You have more than a small spotting of blood in your stool.  You pass large blood clots in your stool.  Your abdomen is swollen.  You have nausea or vomiting.  You have a fever.  You have increasing abdominal pain that is not relieved with medicine. This information is not intended to replace advice given to you by your health care provider. Make sure you discuss any questions you have with your health care provider. Document Released: 06/22/2004 Document Revised: 08/02/2016 Document Reviewed: 01/20/2016 Elsevier Interactive Patient Education  2018 [place] Endoscopy, Adult, Care After This sheet gives you information about how to care for yourself after your procedure. Your health care provider may also give you more specific instructions. If you have problems or questions, contact your health care provider. What can I expect after the procedure? After the procedure, it is common to have:  A sore throat.  Mild stomach pain or discomfort.  Bloating.  Nausea. Follow these instructions at  home:   Follow instructions from your health care provider about what to eat or drink after your procedure.  Return to your normal activities as told by your health care provider. Ask your health care provider what activities are safe for you.  Take over-the-counter and prescription medicines only as told by your health care provider.  Do not drive for 24 hours if you were given a sedative during your procedure.  Keep all follow-up visits as told by your health care provider. This is important. Contact a health care provider if you have:  A sore throat that lasts longer than one day.  Trouble swallowing. Get help right away if:  You vomit blood or your vomit looks like coffee grounds.  You have: ? A fever. ? Bloody, black, or tarry stools. ? A severe sore throat or you cannot swallow. ? Difficulty breathing. ? Severe pain in your chest or abdomen. Summary  After the procedure, it is common to have a sore throat, mild stomach discomfort, bloating, and nausea.  Do not drive for 24 hours if you were given a sedative during the procedure.  Follow instructions from your health care provider about what to eat or drink after your procedure.  Return to your normal activities as told by your health care provider. This information is not intended to replace advice given to you by your health care provider. Make sure you discuss any questions you have with your health care provider. Document Revised: 05/02/2018 Document Reviewed: 04/10/2018 Elsevier Patient Education  [place].

## 2020-08-12 NOTE — Telephone Encounter (Signed)
Forwarded to Dr C for review

## 2020-08-12 NOTE — Telephone Encounter (Signed)
I'm sorry I didn't do this time, I will definitely send that next time. Thanks

## 2020-08-12 NOTE — Telephone Encounter (Signed)
Ann, cardiology has a pre op clearance protocol we use to determine if patients are stable from a cardiac standpoint for procedures.  This protocol also addresses anticoagulation and Plavix-aspirin issues. In the future if you would send a formal pre op clearance request to cardiology it will facilitate cardiac clearance.  Thanks  Kerin Ransom PA-C 08/12/2020 9:24 AM

## 2020-08-12 NOTE — Telephone Encounter (Signed)
We saw patient yesterday and she is scheduled endoscopy and colonoscopy this Friday - she needs to stop Plavix 5 days prior; however, she told Dr Jenetta Downer she had stopped it on her own - please advise if ok for patient to stop

## 2020-08-12 NOTE — Telephone Encounter (Signed)
Thanks Ann 

## 2020-08-12 NOTE — Telephone Encounter (Signed)
   Primary Cardiologist: Carlyle Dolly, MD  Chart reviewed and patient contacted by phone today as part of pre-operative protocol coverage. Given past medical history and time since last visit, based on ACC/AHA guidelines, Amy Snow would be at acceptable risk for the planned procedure without further cardiovascular testing.   We were contacted yesterday afternoon to let us know that the patient had already stopped her Plavix and is scheduled for her procedure on Friday.  I called the patient and she has been chest pain-free.  She tells me she frequently forgets to take her medications on the weekend and had already been off her Plavix for the previous 2 days.  I will review with Dr. Harl Bowie but for now we will go ahead and clear her to continue to hold her Plavix and have her procedure done this Friday.  The patient was advised that if she develops new symptoms prior to surgery to contact our office to arrange for a follow-up visit, and she verbalized understanding.  I will route this recommendation to the requesting party via Epic fax function and remove from pre-op pool.  Please call with questions.  Kerin Ransom, PA-C 08/12/2020, 9:17 AM

## 2020-08-13 ENCOUNTER — Encounter (HOSPITAL_COMMUNITY): Payer: Self-pay

## 2020-08-13 ENCOUNTER — Other Ambulatory Visit (HOSPITAL_COMMUNITY)
Admission: RE | Admit: 2020-08-13 | Discharge: 2020-08-13 | Disposition: A | Discharge status: Home / Self Care | Payer: 59 | Source: Ambulatory Visit | Attending: Gastroenterology | Admitting: Gastroenterology

## 2020-08-13 ENCOUNTER — Encounter (HOSPITAL_COMMUNITY)
Admission: RE | Admit: 2020-08-13 | Discharge: 2020-08-13 | Disposition: A | Discharge status: Home / Self Care | Payer: 59 | Source: Ambulatory Visit | Attending: Gastroenterology | Admitting: Gastroenterology

## 2020-08-13 ENCOUNTER — Other Ambulatory Visit: Payer: Self-pay

## 2020-08-13 DIAGNOSIS — Z01818 Encounter for other preprocedural examination: Secondary | ICD-10-CM | POA: Insufficient documentation

## 2020-08-13 DIAGNOSIS — Z20822 Contact with and (suspected) exposure to covid-19: Secondary | ICD-10-CM | POA: Diagnosis not present

## 2020-08-13 DIAGNOSIS — I10 Essential (primary) hypertension: Secondary | ICD-10-CM | POA: Diagnosis not present

## 2020-08-13 HISTORY — DX: Family history of other specified conditions: Z84.89

## 2020-08-13 LAB — BASIC METABOLIC PANEL
Anion gap: 13 (ref 5–15)
BUN: 32 mg/dL — ABNORMAL HIGH (ref 6–20)
CO2: 21 mmol/L — ABNORMAL LOW (ref 22–32)
Calcium: 9.1 mg/dL (ref 8.9–10.3)
Chloride: 101 mmol/L (ref 98–111)
Creatinine, Ser: 0.96 mg/dL (ref 0.44–1.00)
GFR calc Af Amer: >60 mL/min (ref >60)
GFR calc non Af Amer: >60 mL/min (ref >60)
Glucose, Bld: 457 mg/dL — ABNORMAL HIGH (ref 70–99)
Potassium: 4.2 mmol/L (ref 3.5–5.1)
Sodium: 135 mmol/L (ref 135–145)

## 2020-08-13 LAB — SARS CORONAVIRUS 2 (TAT 6-24 HRS): SARS Coronavirus 2: NEGATIVE

## 2020-08-15 ENCOUNTER — Encounter (HOSPITAL_COMMUNITY)
Admission: RE | Disposition: A | Discharge status: Home / Self Care | Payer: Self-pay | Source: Home / Self Care | Attending: Gastroenterology

## 2020-08-15 ENCOUNTER — Ambulatory Visit (HOSPITAL_COMMUNITY): Payer: 59 | Admitting: Anesthesiology

## 2020-08-15 ENCOUNTER — Other Ambulatory Visit: Payer: Self-pay

## 2020-08-15 ENCOUNTER — Ambulatory Visit (HOSPITAL_COMMUNITY)
Admission: RE | Admit: 2020-08-15 | Discharge: 2020-08-15 | Disposition: A | Discharge status: Home / Self Care | Payer: 59 | Attending: Gastroenterology | Admitting: Gastroenterology

## 2020-08-15 DIAGNOSIS — Z7982 Long term (current) use of aspirin: Secondary | ICD-10-CM | POA: Diagnosis not present

## 2020-08-15 DIAGNOSIS — I2510 Atherosclerotic heart disease of native coronary artery without angina pectoris: Secondary | ICD-10-CM | POA: Insufficient documentation

## 2020-08-15 DIAGNOSIS — K219 Gastro-esophageal reflux disease without esophagitis: Secondary | ICD-10-CM | POA: Diagnosis not present

## 2020-08-15 DIAGNOSIS — K6282 Dysplasia of anus: Secondary | ICD-10-CM | POA: Insufficient documentation

## 2020-08-15 DIAGNOSIS — R197 Diarrhea, unspecified: Secondary | ICD-10-CM | POA: Insufficient documentation

## 2020-08-15 DIAGNOSIS — Z7902 Long term (current) use of antithrombotics/antiplatelets: Secondary | ICD-10-CM | POA: Insufficient documentation

## 2020-08-15 DIAGNOSIS — Z794 Long term (current) use of insulin: Secondary | ICD-10-CM | POA: Insufficient documentation

## 2020-08-15 DIAGNOSIS — E119 Type 2 diabetes mellitus without complications: Secondary | ICD-10-CM | POA: Insufficient documentation

## 2020-08-15 DIAGNOSIS — I10 Essential (primary) hypertension: Secondary | ICD-10-CM | POA: Insufficient documentation

## 2020-08-15 DIAGNOSIS — Z79899 Other long term (current) drug therapy: Secondary | ICD-10-CM | POA: Diagnosis not present

## 2020-08-15 DIAGNOSIS — E785 Hyperlipidemia, unspecified: Secondary | ICD-10-CM | POA: Insufficient documentation

## 2020-08-15 DIAGNOSIS — D122 Benign neoplasm of ascending colon: Secondary | ICD-10-CM | POA: Insufficient documentation

## 2020-08-15 DIAGNOSIS — I255 Ischemic cardiomyopathy: Secondary | ICD-10-CM | POA: Insufficient documentation

## 2020-08-15 DIAGNOSIS — Z955 Presence of coronary angioplasty implant and graft: Secondary | ICD-10-CM | POA: Diagnosis not present

## 2020-08-15 DIAGNOSIS — M797 Fibromyalgia: Secondary | ICD-10-CM | POA: Diagnosis not present

## 2020-08-15 DIAGNOSIS — F329 Major depressive disorder, single episode, unspecified: Secondary | ICD-10-CM | POA: Insufficient documentation

## 2020-08-15 DIAGNOSIS — I252 Old myocardial infarction: Secondary | ICD-10-CM | POA: Insufficient documentation

## 2020-08-15 DIAGNOSIS — R1310 Dysphagia, unspecified: Secondary | ICD-10-CM

## 2020-08-15 DIAGNOSIS — F419 Anxiety disorder, unspecified: Secondary | ICD-10-CM | POA: Insufficient documentation

## 2020-08-15 HISTORY — PX: BIOPSY: SHX5522

## 2020-08-15 HISTORY — PX: ESOPHAGOGASTRODUODENOSCOPY (EGD) WITH PROPOFOL: SHX5813

## 2020-08-15 HISTORY — PX: ESOPHAGEAL DILATION: SHX303

## 2020-08-15 HISTORY — PX: COLONOSCOPY WITH PROPOFOL: SHX5780

## 2020-08-15 HISTORY — PX: POLYPECTOMY: SHX5525

## 2020-08-15 LAB — GLUCOSE, CAPILLARY
Glucose-Capillary: 95 mg/dL (ref 70–99)
Glucose-Capillary: 188 mg/dL — ABNORMAL HIGH (ref 70–99)
Glucose-Capillary: 38 mg/dL — CL (ref 70–99)

## 2020-08-15 SURGERY — COLONOSCOPY WITH PROPOFOL
Anesthesia: General

## 2020-08-15 MED ORDER — DEXTROSE 50 % IV SOLN
INTRAVENOUS | Status: AC
  Filled 2020-08-15: qty 50

## 2020-08-15 MED ORDER — LIDOCAINE VISCOUS HCL 2 % MT SOLN
OROMUCOSAL | Status: AC
  Filled 2020-08-15: qty 15

## 2020-08-15 MED ORDER — DEXTROSE 50 % IV SOLN
INTRAVENOUS | Status: DC | PRN
  Administered 2020-08-15 (×2): .5 via INTRAVENOUS

## 2020-08-15 MED ORDER — LIDOCAINE VISCOUS HCL 2 % MT SOLN
15.0000 mL | Freq: Once | OROMUCOSAL | Status: AC
  Administered 2020-08-15: 15 mL via OROMUCOSAL

## 2020-08-15 MED ORDER — GLYCOPYRROLATE 0.2 MG/ML IJ SOLN
INTRAMUSCULAR | Status: AC
  Filled 2020-08-15: qty 1

## 2020-08-15 MED ORDER — LACTATED RINGERS IV SOLN: Freq: Once | INTRAVENOUS | Status: AC

## 2020-08-15 MED ORDER — PHENYLEPHRINE HCL (PRESSORS) 10 MG/ML IV SOLN
INTRAVENOUS | Status: DC | PRN
  Administered 2020-08-15 (×3): 40 ug via INTRAVENOUS

## 2020-08-15 MED ORDER — STERILE WATER FOR IRRIGATION IR SOLN
Status: DC | PRN
  Administered 2020-08-15: 1.5 mL

## 2020-08-15 MED ORDER — PROPOFOL 10 MG/ML IV BOLUS
INTRAVENOUS | Status: DC | PRN
  Administered 2020-08-15: 100 mg via INTRAVENOUS
  Administered 2020-08-15 (×2): 20 mg via INTRAVENOUS

## 2020-08-15 MED ORDER — PROPOFOL 10 MG/ML IV BOLUS
INTRAVENOUS | Status: AC
  Filled 2020-08-15: qty 20

## 2020-08-15 MED ORDER — GLYCOPYRROLATE 0.2 MG/ML IJ SOLN: 0.2000 mg | Freq: Once | INTRAMUSCULAR | Status: DC

## 2020-08-15 MED ORDER — PROPOFOL 500 MG/50ML IV EMUL
INTRAVENOUS | Status: DC | PRN
  Administered 2020-08-15: 125 ug/kg/min via INTRAVENOUS
  Administered 2020-08-15: 100 ug/kg/min via INTRAVENOUS

## 2020-08-15 MED ORDER — LIDOCAINE HCL (CARDIAC) PF 50 MG/5ML IV SOSY
PREFILLED_SYRINGE | INTRAVENOUS | Status: DC | PRN
  Administered 2020-08-15: 100 mg via INTRAVENOUS

## 2020-08-15 MED ORDER — LACTATED RINGERS IV SOLN: INTRAVENOUS | Status: DC | PRN

## 2020-08-15 NOTE — Discharge Instructions (Signed)
You are being discharged to home.  Resume your previous diet.  We are waiting for your pathology results.  Your physician has recommended a repeat colonoscopy for surveillance based on pathology results.  [place] Colonoscopy, Adult, Care After This sheet gives you information about how to care for yourself after your procedure. Your doctor may also give you more specific instructions. If you have problems or questions, call your doctor. What can I expect after the procedure? After the procedure, it is common to have:  A small amount of blood in your poop (stool) for 24 hours.  Some gas.  Mild cramping or bloating in your belly (abdomen). Follow these instructions at home: Eating and drinking   Drink enough fluid to keep your pee (urine) pale yellow.  Follow instructions from your doctor about what you cannot eat or drink.  Return to your normal diet as told by your doctor. Avoid heavy or fried foods that are hard to digest. Activity  Rest as told by your doctor.  Do not sit for a long time without moving. Get up to take short walks every 1-2 hours. This is important. Ask for help if you feel weak or unsteady.  Return to your normal activities as told by your doctor. Ask your doctor what activities are safe for you. To help cramping and bloating:   Try walking around.  Put heat on your belly as told by your doctor. Use the heat source that your doctor recommends, such as a moist heat pack or a heating pad. ? Put a towel between your skin and the heat source. ? Leave the heat on for 20-30 minutes. ? Remove the heat if your skin turns bright red. This is very important if you are unable to feel pain, heat, or cold. You may have a greater risk of getting burned. General instructions  For the first 24 hours after the procedure: ? Do not drive or use machinery. ? Do not sign important documents. ? Do not drink alcohol. ? Do your daily activities more slowly than normal. ? Eat  foods that are soft and easy to digest.  Take over-the-counter or prescription medicines only as told by your doctor.  Keep all follow-up visits as told by your doctor. This is important. Contact a doctor if:  You have blood in your poop 2-3 days after the procedure. Get help right away if:  You have more than a small amount of blood in your poop.  You see large clumps of tissue (blood clots) in your poop.  Your belly is swollen.  You feel like you may vomit (nauseous).  You vomit.  You have a fever.  You have belly pain that gets worse, and medicine does not help your pain. Summary  After the procedure, it is common to have a small amount of blood in your poop. You may also have mild cramping and bloating in your belly.  For the first 24 hours after the procedure, do not drive or use machinery, do not sign important documents, and do not drink alcohol.  Get help right away if you have a lot of blood in your poop, feel like you may vomit, have a fever, or have more belly pain. This information is not intended to replace advice given to you by your health care provider. Make sure you discuss any questions you have with your health care provider. Document Revised: 06/04/2019 Document Reviewed: 06/04/2019 Elsevier Patient Education  [place].  Upper Endoscopy, Adult, Care  After This sheet gives you information about how to care for yourself after your procedure. Your health care provider may also give you more specific instructions. If you have problems or questions, contact your health care provider. What can I expect after the procedure? After the procedure, it is common to have:  A sore throat.  Mild stomach pain or discomfort.  Bloating.  Nausea. Follow these instructions at home:   Follow instructions from your health care provider about what to eat or drink after your procedure.  Return to your normal activities as told by your health care provider. Ask  your health care provider what activities are safe for you.  Take over-the-counter and prescription medicines only as told by your health care provider.  Do not drive for 24 hours if you were given a sedative during your procedure.  Keep all follow-up visits as told by your health care provider. This is important. Contact a health care provider if you have:  A sore throat that lasts longer than one day.  Trouble swallowing. Get help right away if:  You vomit blood or your vomit looks like coffee grounds.  You have: ? A fever. ? Bloody, black, or tarry stools. ? A severe sore throat or you cannot swallow. ? Difficulty breathing. ? Severe pain in your chest or abdomen. Summary  After the procedure, it is common to have a sore throat, mild stomach discomfort, bloating, and nausea.  Do not drive for 24 hours if you were given a sedative during the procedure.  Follow instructions from your health care provider about what to eat or drink after your procedure.  Return to your normal activities as told by your health care provider. This information is not intended to replace advice given to you by your health care provider. Make sure you discuss any questions you have with your health care provider. Document Revised: 05/02/2018 Document Reviewed: 04/10/2018 Elsevier Patient Education  [place].

## 2020-08-15 NOTE — Op Note (Signed)
[facility] Patient Name: Amy Snow Procedure Date: 08/15/2020 12:56 PM MRN: [id number] Date of Birth: March 08, 1966 Attending MD: Maylon Peppers ,  CSN: [id number] Age: 54 Admit Type: Outpatient Procedure:                Upper GI endoscopy Indications:              Dysphagia Providers:                Maylon Peppers, Caprice Kluver, Aram Candela Referring MD:              Medicines:                Monitored Anesthesia Care Complications:            No immediate complications. Estimated Blood Loss:     Estimated blood loss: none. Procedure:                Pre-Anesthesia Assessment:                           - Prior to the procedure, a History and Physical                            was performed, and patient medications, allergies                            and sensitivities were reviewed. The patient's                            tolerance of previous anesthesia was reviewed.                           - The risks and benefits of the procedure and the                            sedation options and risks were discussed with the                            patient. All questions were answered and informed                            consent was obtained.                           - ASA Grade Assessment: II - A patient with mild                            systemic disease.                           After obtaining informed consent, the endoscope was                            passed under direct vision. Throughout the                            procedure, the patient's blood pressure, pulse, and  oxygen saturations were monitored continuously. The                            GIF-H190 ([id number]) scope was introduced through the                            mouth, and advanced to the second part of duodenum.                            The upper GI endoscopy was accomplished without                            difficulty. The patient tolerated the procedure                             well. Scope In: 1:11:11 PM Scope Out: 1:24:50 PM Total Procedure Duration: 0 hours 13 minutes 39 seconds  Findings:      No endoscopic abnormality was evident in the esophagus to explain the       patient's complaint of dysphagia. A guidewire was placed and the scope       was withdrawn. Dilation was performed with a Savary dilator with no       resistance at 18 mm. No mucosal disruption was observed. Biopsies were       obtained from the proximal and distal esophagus with cold forceps for       histology of suspected eosinophilic esophagitis.      The entire examined stomach was normal.      The examined duodenum was normal. Impression:               - No endoscopic esophageal abnormality to explain                            patient's dysphagia. Dilated. Biopsied.                           - Normal stomach.                           - Normal examined duodenum. Moderate Sedation:      Per Anesthesia Care Recommendation:           - Discharge patient to home (ambulatory).                           - Resume previous diet.                           - Await pathology results. Procedure Code(s):        --- Professional ---                           [phone], GC, Esophagogastroduodenoscopy, flexible,                            transoral; with insertion of guide wire followed by  passage of dilator(s) through esophagus over guide                            wire                           [id number], 59, Esophagogastroduodenoscopy, flexible,                            transoral; with biopsy, single or multiple Diagnosis Code(s):        --- Professional ---                           R13.10, Dysphagia, unspecified CPT copyright 2019 American Medical Association. All rights reserved. The codes documented in this report are preliminary and upon coder review may  be revised to meet current compliance requirements. Maylon Peppers, MD Maylon Peppers,   08/15/2020 1:52:36 PM This report has been signed electronically. Number of Addenda: 0

## 2020-08-15 NOTE — Anesthesia Postprocedure Evaluation (Signed)
Anesthesia Post Note  Patient: ARIJANA NARAYAN  Procedure(s) Performed: COLONOSCOPY WITH PROPOFOL (N/A ) ESOPHAGOGASTRODUODENOSCOPY (EGD) WITH PROPOFOL (N/A ) ESOPHAGEAL DILATION (N/A ) BIOPSY POLYPECTOMY  Patient location during evaluation: Phase II Anesthesia Type: General Level of consciousness: awake and alert and patient cooperative Pain management: satisfactory to patient Vital Signs Assessment: post-procedure vital signs reviewed and stable Respiratory status: spontaneous breathing Cardiovascular status: stable Postop Assessment: no apparent nausea or vomiting Anesthetic complications: no Comments: Blood Glucose 147   No complications documented.   Last Vitals:  Vitals:   08/15/20 1427 08/15/20 1432  BP: (!) 96/56   Pulse:  65  Resp:  16  Temp:  36.7 C  SpO2:  100%    Last Pain:  Vitals:   08/15/20 1432  TempSrc: Oral  PainSc: 0-No pain                 Kaileigh Viswanathan

## 2020-08-15 NOTE — Anesthesia Preprocedure Evaluation (Addendum)
Anesthesia Evaluation  Patient identified by MRN, date of birth, ID band Patient awake    Reviewed: Allergy & Precautions, NPO status , Patient's Chart, lab work & pertinent test results  History of Anesthesia Complications (+) Family history of anesthesia reaction  Airway Mallampati: III  TM Distance: >3 FB Neck ROM: Full    Dental  (+) Dental Advisory Given, Missing, Chipped   Pulmonary    Pulmonary exam normal breath sounds clear to auscultation       Cardiovascular Exercise Tolerance: Good hypertension, Pt. on medications + angina with exertion + CAD, + Past MI and + Cardiac Stents  Normal cardiovascular exam Rhythm:Regular Rate:Normal  Ischemic cardiomyopathy  a. EF <25% in 2016, improved to normal thereafter. b. EF 50% in 05/2019.  13-Aug-2020 11:05:46 [facility] ROUTINE RECORD Normal sinus rhythm Cannot rule out Anterior infarct , age undetermined Abnormal ECG No significant change since 2020 Confirmed by Jenkins Rouge [phone]) on 08/13/2020 11:10:53 PM    Neuro/Psych PSYCHIATRIC DISORDERS Anxiety Depression  Neuromuscular disease    GI/Hepatic Neg liver ROS, GERD  Medicated,  Endo/Other  diabetes, Well Controlled, Type 2, Insulin Dependent  Renal/GU negative Renal ROS     Musculoskeletal  (+) Fibromyalgia -  Abdominal   Peds  Hematology   Anesthesia Other Findings Ischemic cardiomyopathy  a. EF <25% in 2016, improved to normal thereafter. b. EF 50% in 05/2019.   Reproductive/Obstetrics                            Anesthesia Physical Anesthesia Plan  ASA: III  Anesthesia Plan: General   Post-op Pain Management:    Induction: Intravenous  PONV Risk Score and Plan: TIVA  Airway Management Planned: Nasal Cannula and Natural Airway  Additional Equipment:   Intra-op Plan:   Post-operative Plan:   Informed Consent: I have reviewed the patients History  and Physical, chart, labs and discussed the procedure including the risks, benefits and alternatives for the proposed anesthesia with the patient or authorized representative who has indicated his/her understanding and acceptance.     Dental advisory given  Plan Discussed with: CRNA and Surgeon  Anesthesia Plan Comments:        Anesthesia Quick Evaluation

## 2020-08-15 NOTE — Addendum Note (Signed)
Addendum  created 08/15/20 1442 by Vista Deck, CRNA   Charge Capture section accepted

## 2020-08-15 NOTE — Anesthesia Procedure Notes (Signed)
Procedure Name: MAC Date/Time: 08/15/2020 1:05 PM Performed by: Vista Deck, CRNA Pre-anesthesia Checklist: Patient identified, Emergency Drugs available, Suction available, Timeout performed and Patient being monitored Patient Re-evaluated:Patient Re-evaluated prior to induction Oxygen Delivery Method: Nasal Cannula

## 2020-08-15 NOTE — Op Note (Signed)
[facility] Patient Name: Amy Snow Procedure Date: 08/15/2020 1:26 PM MRN: [id number] Date of Birth: 1966-07-13 Attending MD: Maylon Peppers ,  CSN: [id number] Age: 54 Admit Type: Outpatient Procedure:                Colonoscopy Indications:              Clinically significant diarrhea of unexplained                            origin Providers:                Maylon Peppers, Caprice Kluver, Aram Candela Referring MD:              Medicines:                Monitored Anesthesia Care Complications:            No immediate complications. Estimated Blood Loss:     Estimated blood loss: none. Procedure:                Pre-Anesthesia Assessment:                           - Prior to the procedure, a History and Physical                            was performed, and patient medications, allergies                            and sensitivities were reviewed. The patient's                            tolerance of previous anesthesia was reviewed.                           - The risks and benefits of the procedure and the                            sedation options and risks were discussed with the                            patient. All questions were answered and informed                            consent was obtained.                           - ASA Grade Assessment: II - A patient with mild                            systemic disease.                           After obtaining informed consent, the colonoscope                            was passed under direct vision. Throughout the  procedure, the patient's blood pressure, pulse, and                            oxygen saturations were monitored continuously. The                            [id number]-[id number] ([id number]) scope was introduced through                            the anus and advanced to the the cecum, identified                            by appendiceal orifice and ileocecal valve. The                             colonoscopy was performed without difficulty. The                            patient tolerated the procedure well. Scope                            withdrawal time was 14 minutes. The quality of the                            bowel preparation was excellent. Scope In: 1:29:14 PM Scope Out: 1:49:37 PM Scope Withdrawal Time: 0 hours 16 minutes 8 seconds  Total Procedure Duration: 0 hours 20 minutes 23 seconds  Findings:      The perianal and digital rectal examinations were normal.      A 4 mm polyp was found in the ascending colon. The polyp was sessile.       The polyp was removed with a cold snare. Resection and retrieval were       complete.      A 2 mm polyp was found in the anorectal junction. The polyp was sessile.       The polyp was removed with a cold biopsy forceps. Resection and       retrieval were complete.      The rest of the remaining colon appeared normal. Biopsies for histology       were taken with a cold forceps from the right colon and left colon for       evaluation of microscopic colitis.      The retroflexed view of the distal rectum and anal verge was normal and       showed no anal or rectal abnormalities. Impression:               - One 4 mm polyp in the ascending colon, removed                            with a cold snare. Resected and retrieved.                           - One 2 mm polyp in the anorectal junction, removed  with a cold biopsy forceps. Resected and retrieved.                           - The entire examined colon is normal. Biopsied.                           - The distal rectum and anal verge are normal on                            retroflexion view. Moderate Sedation:      Per Anesthesia Care Recommendation:           - Discharge patient to home (ambulatory).                           - Resume previous diet.                           - Await pathology results.                           - Repeat colonoscopy  for surveillance based on                            pathology results. Procedure Code(s):        --- Professional ---                           [phone], GC, Colonoscopy, flexible; with removal of                            tumor(s), polyp(s), or other lesion(s) by snare                            technique                           45380, 56, Colonoscopy, flexible; with biopsy,                            single or multiple Diagnosis Code(s):        --- Professional ---                           K63.5, Polyp of colon                           K62.1, Rectal polyp                           R19.7, Diarrhea, unspecified CPT copyright 2019 [employer]. All rights reserved. The codes documented in this report are preliminary and upon coder review may  be revised to meet current compliance requirements. Maylon Peppers, MD Maylon Peppers,  08/15/2020 1:58:46 PM This report has been signed electronically. Number of Addenda: 0

## 2020-08-15 NOTE — Anesthesia Postprocedure Evaluation (Signed)
Anesthesia Post Note  Patient: Amy Snow  Procedure(s) Performed: COLONOSCOPY WITH PROPOFOL (N/A ) ESOPHAGOGASTRODUODENOSCOPY (EGD) WITH PROPOFOL (N/A ) ESOPHAGEAL DILATION (N/A ) BIOPSY POLYPECTOMY  Patient location during evaluation: Phase II Anesthesia Type: General Level of consciousness: awake and alert and oriented Pain management: pain level controlled Vital Signs Assessment: post-procedure vital signs reviewed and stable Respiratory status: spontaneous breathing and respiratory function stable Cardiovascular status: blood pressure returned to baseline and stable Postop Assessment: no apparent nausea or vomiting Anesthetic complications: no   No complications documented.   Last Vitals:  Vitals:   08/15/20 1427 08/15/20 1432  BP: (!) 96/56   Pulse:  65  Resp:  16  Temp:  36.7 C  SpO2:  100%    Last Pain:  Vitals:   08/15/20 1432  TempSrc: Oral  PainSc: 0-No pain                 Ruby Logiudice C Kyrie Fludd

## 2020-08-15 NOTE — Transfer of Care (Signed)
Immediate Anesthesia Transfer of Care Note  Patient: TRENIA TENNYSON  Procedure(s) Performed: COLONOSCOPY WITH PROPOFOL (N/A ) ESOPHAGOGASTRODUODENOSCOPY (EGD) WITH PROPOFOL (N/A ) ESOPHAGEAL DILATION (N/A ) BIOPSY POLYPECTOMY  Patient Location: PACU  Anesthesia Type:General  Level of Consciousness: awake, alert  and patient cooperative  Airway & Oxygen Therapy: Patient Spontanous Breathing  Post-op Assessment: Report given to RN and Post -op Vital signs reviewed and stable  Post vital signs: Reviewed and stable  Last Vitals:  Vitals Value Taken Time  BP    Temp    Pulse 63 08/15/20 1356  Resp 27 08/15/20 1356  SpO2 99 % 08/15/20 1356  Vitals shown include unvalidated device data.  Last Pain:  Vitals:   08/15/20 1308  TempSrc:   PainSc: 0-No pain      Patients Stated Pain Goal: 9 (19/75/88 3254)  Complications: No complications documented.

## 2020-08-15 NOTE — Interval H&P Note (Signed)
History and Physical Interval Note:  08/15/2020 11:59 AM Amy Snow is a 54 y.o. female with  history of STEMI 2016 status post DES x2 PDA with medical treatment for residual disease on DAPT, HTN, HLD, GERD, DM, fibromyalgia, mild anemia, anxiety who comes to the hospital for evaluation of heartburn, dysphagia and diarrhea.  Patient reports having persistent episodes of loose stool without blood. She also reports having daily heartburn and presence of intermittent dysphagia to solids. Denies any other symptoms at the moment.  BP 109/69   Pulse 69   Temp 97.9 F (36.6 C) (Oral)   Ht 4' 11.75" (1.518 m)   Wt 86.2 kg   LMP 12/05/2015   SpO2 100%   BMI 37.42 kg/m  GENERAL: The patient is AO x3, in no acute distress. HEENT: Head is normocephalic and atraumatic. EOMI are intact. Mouth is well hydrated and without lesions. NECK: Supple. No masses LUNGS: Clear to auscultation. No presence of rhonchi/wheezing/rales. Adequate chest expansion HEART: RRR, normal s1 and s2. ABDOMEN: Soft, nontender, no guarding, no peritoneal signs, and nondistended. BS +. No masses. EXTREMITIES: Without any cyanosis, clubbing, rash, lesions or edema. NEUROLOGIC: AOx3, no focal motor deficit. SKIN: no jaundice, no rashes  TONIANNE FINE  has presented today for surgery, with the diagnosis of dysphagia, chrnoic diarrhea.  The various methods of treatment have been discussed with the patient and family. After consideration of risks, benefits and other options for treatment, the patient has consented to  Procedure(s) with comments: COLONOSCOPY WITH PROPOFOL (N/A) - 1230 ESOPHAGOGASTRODUODENOSCOPY (EGD) WITH PROPOFOL (N/A) ESOPHAGEAL DILATION (N/A) as a surgical intervention.  The patient's history has been reviewed, patient examined, no change in status, stable for surgery.  I have reviewed the patient's chart and labs.  Questions were answered to the patient's satisfaction.     Maylon Peppers  [place]

## 2020-08-20 ENCOUNTER — Encounter (HOSPITAL_COMMUNITY): Payer: Self-pay | Admitting: Gastroenterology

## 2020-08-20 LAB — SURGICAL PATHOLOGY

## 2020-11-25 ENCOUNTER — Other Ambulatory Visit: Payer: Self-pay | Admitting: Physician Assistant

## 2021-01-15 ENCOUNTER — Encounter (INDEPENDENT_AMBULATORY_CARE_PROVIDER_SITE_OTHER): Payer: Self-pay | Admitting: *Deleted

## 2021-02-03 ENCOUNTER — Other Ambulatory Visit: Payer: Self-pay | Admitting: Physician Assistant

## 2021-02-04 NOTE — Telephone Encounter (Signed)
This is a [facility] pt °

## 2021-05-04 ENCOUNTER — Emergency Department (HOSPITAL_COMMUNITY): Payer: 59

## 2021-05-04 ENCOUNTER — Encounter (HOSPITAL_COMMUNITY): Payer: Self-pay | Admitting: Emergency Medicine

## 2021-05-04 ENCOUNTER — Observation Stay (HOSPITAL_COMMUNITY)
Admission: EM | Admit: 2021-05-04 | Discharge: 2021-05-06 | Disposition: A | Discharge status: Home / Self Care | Payer: 59 | Attending: Cardiology | Admitting: Cardiology

## 2021-05-04 ENCOUNTER — Telehealth: Payer: Self-pay | Admitting: Student in an Organized Health Care Education/Training Program

## 2021-05-04 ENCOUNTER — Other Ambulatory Visit: Payer: Self-pay

## 2021-05-04 ENCOUNTER — Telehealth: Payer: Self-pay | Admitting: Cardiology

## 2021-05-04 DIAGNOSIS — Z79899 Other long term (current) drug therapy: Secondary | ICD-10-CM | POA: Diagnosis not present

## 2021-05-04 DIAGNOSIS — R778 Other specified abnormalities of plasma proteins: Secondary | ICD-10-CM | POA: Insufficient documentation

## 2021-05-04 DIAGNOSIS — I2510 Atherosclerotic heart disease of native coronary artery without angina pectoris: Secondary | ICD-10-CM | POA: Diagnosis present

## 2021-05-04 DIAGNOSIS — Z955 Presence of coronary angioplasty implant and graft: Secondary | ICD-10-CM | POA: Diagnosis not present

## 2021-05-04 DIAGNOSIS — Z794 Long term (current) use of insulin: Secondary | ICD-10-CM | POA: Insufficient documentation

## 2021-05-04 DIAGNOSIS — E119 Type 2 diabetes mellitus without complications: Secondary | ICD-10-CM | POA: Diagnosis not present

## 2021-05-04 DIAGNOSIS — I5042 Chronic combined systolic (congestive) and diastolic (congestive) heart failure: Secondary | ICD-10-CM | POA: Diagnosis not present

## 2021-05-04 DIAGNOSIS — Z7982 Long term (current) use of aspirin: Secondary | ICD-10-CM | POA: Diagnosis not present

## 2021-05-04 DIAGNOSIS — Z20822 Contact with and (suspected) exposure to covid-19: Secondary | ICD-10-CM | POA: Diagnosis not present

## 2021-05-04 DIAGNOSIS — I25110 Atherosclerotic heart disease of native coronary artery with unstable angina pectoris: Principal | ICD-10-CM | POA: Insufficient documentation

## 2021-05-04 DIAGNOSIS — R7989 Other specified abnormal findings of blood chemistry: Secondary | ICD-10-CM

## 2021-05-04 DIAGNOSIS — K219 Gastro-esophageal reflux disease without esophagitis: Secondary | ICD-10-CM | POA: Diagnosis present

## 2021-05-04 DIAGNOSIS — F418 Other specified anxiety disorders: Secondary | ICD-10-CM | POA: Diagnosis present

## 2021-05-04 DIAGNOSIS — I110 Hypertensive heart disease with heart failure: Secondary | ICD-10-CM | POA: Diagnosis not present

## 2021-05-04 DIAGNOSIS — R0789 Other chest pain: Secondary | ICD-10-CM | POA: Diagnosis present

## 2021-05-04 DIAGNOSIS — R079 Chest pain, unspecified: Secondary | ICD-10-CM | POA: Diagnosis present

## 2021-05-04 DIAGNOSIS — I10 Essential (primary) hypertension: Secondary | ICD-10-CM | POA: Diagnosis present

## 2021-05-04 DIAGNOSIS — E782 Mixed hyperlipidemia: Secondary | ICD-10-CM

## 2021-05-04 LAB — CBC
HCT: 36.8 % (ref 36.0–46.0)
Hemoglobin: 11.3 g/dL — ABNORMAL LOW (ref 12.0–15.0)
MCH: 24.8 pg — ABNORMAL LOW (ref 26.0–34.0)
MCHC: 30.7 g/dL (ref 30.0–36.0)
MCV: 80.9 fL (ref 80.0–100.0)
Platelets: 212 10*3/uL (ref 150–400)
RBC: 4.55 MIL/uL (ref 3.87–5.11)
RDW: 16.9 % — ABNORMAL HIGH (ref 11.5–15.5)
WBC: 6.6 10*3/uL (ref 4.0–10.5)
nRBC: 0 % (ref 0.0–0.2)

## 2021-05-04 LAB — RESP PANEL BY RT-PCR (FLU A&B, COVID) ARPGX2
Influenza A by PCR: NEGATIVE
Influenza B by PCR: NEGATIVE
SARS Coronavirus 2 by RT PCR: NEGATIVE

## 2021-05-04 LAB — BASIC METABOLIC PANEL
Anion gap: 12 (ref 5–15)
BUN: 43 mg/dL — ABNORMAL HIGH (ref 6–20)
CO2: 20 mmol/L — ABNORMAL LOW (ref 22–32)
Calcium: 8.6 mg/dL — ABNORMAL LOW (ref 8.9–10.3)
Chloride: 101 mmol/L (ref 98–111)
Creatinine, Ser: 1.09 mg/dL — ABNORMAL HIGH (ref 0.44–1.00)
GFR, Estimated: >60 mL/min (ref >60)
Glucose, Bld: 256 mg/dL — ABNORMAL HIGH (ref 70–99)
Potassium: 3.9 mmol/L (ref 3.5–5.1)
Sodium: 133 mmol/L — ABNORMAL LOW (ref 135–145)

## 2021-05-04 LAB — CBG MONITORING, ED: Glucose-Capillary: 230 mg/dL — ABNORMAL HIGH (ref 70–99)

## 2021-05-04 LAB — TROPONIN I (HIGH SENSITIVITY)
Troponin I (High Sensitivity): 66 ng/L — ABNORMAL HIGH (ref <18)
Troponin I (High Sensitivity): 68 ng/L — ABNORMAL HIGH (ref <18)

## 2021-05-04 LAB — POC URINE PREG, ED: Preg Test, Ur: NEGATIVE

## 2021-05-04 MED ORDER — ASPIRIN 81 MG PO CHEW
324.0000 mg | CHEWABLE_TABLET | Freq: Once | ORAL | Status: AC
  Administered 2021-05-04: 324 mg via ORAL
  Filled 2021-05-04: qty 4

## 2021-05-04 MED ORDER — NITROGLYCERIN 0.4 MG SL SUBL: 0.4000 mg | SUBLINGUAL_TABLET | SUBLINGUAL | Status: DC | PRN

## 2021-05-04 NOTE — Telephone Encounter (Signed)
Feels like her chest pain in more at the top of both her breasts (midline) all the way up to her neck. Been bothering since last week.  States that her pain is 7/10.  Took Ntg x 1 last Tuesday, did get relief after one dose.  Stated that Friday was her worst day - did take Ntg that day as well x 1 with relief.  States that she has had the nausea off / on the whole week.  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  States that she does also have fibromyalgia. Suggested she be evaluated in the ED as she states this pain is happening now.  She verbalized understanding & will go to [facility].

## 2021-05-04 NOTE — Telephone Encounter (Signed)
Pt c/o of Chest Pain: 1. Are you having CP right now? no 2. Are you experiencing any other symptoms (ex. SOB, nausea, vomiting, sweating)? Yes All of the above last week  3. How long have you been experiencing CP? For a week now  4. Is your CP continuous or coming and going? Comes and goes. 5. Have you taken Nitroglycerin? yes  04/28/2021 05/01/2021  (states that Friday was a bad day)  Spoke with her PCP was advised to go to ER. States that she rested all weekend and has not had any pain.    Appointment has been made for 7/11   Call [phone]

## 2021-05-04 NOTE — ED Triage Notes (Signed)
Pt c/o chest tightness that radiates up to front of neck. Pt states this has been on going since last week.

## 2021-05-04 NOTE — Telephone Encounter (Signed)
Paged by [facility] for patient at [facility] with chest pain.  Spoke to ED provider Krista Blue and patient with CP midline with radiation to b/l neck that has been intermittent since last week.  Per chart review pain 7/10 severity. Last Tuesday with SLNG x1 and some relief. Friday with worsening sx, took NG with relief then as well. 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 Reached out to PCP and they recommended ED eval.   Prior STEMI in 2016 with DES x2. 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   Has multiple CAD RFs with prior CAD, prior STEMI, DM2, HLD, HTN.   Dr. Gwenlyn Found performed her previous cath.   Labs today remarkable for mild trop elevation (66), ECG with 1 mm depressions in lateral leads (V5-V6) but otherwise negative evaluation. She has CAD RF as above however had similar presentation reported in past thought to be 2/2 gastro. [facility] provider will consult hospitalist for admission and further eval. Planning to have her transfer to [facility] in case she needs more invasive repeat coronary assessment. Given that she is CP free with flat troponins I don't think she needs urgent coronary angio. Once she gets to [facility] the cardiology team will consult.

## 2021-05-04 NOTE — ED Provider Notes (Signed)
[facility] EMERGENCY DEPARTMENT Provider Note   CSN: [id number] Arrival date & time: 05/04/21  1855     History Chief Complaint  Patient presents with   Chest Pain    Amy Snow is a 55 y.o. female with past medical history significant for HTN, HLD, GERD, DM, fibromyalgia, anxiety, and CAD with STEMI 2016 s/p DES x2 who presents the ED with complaints of chest pain.  I reviewed most recent note from her cardiology clinic which was 01/2020.  It was subsequent to ER encounter at [facility] where she received ACS work-up that was negative.  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  On my examination, patient reports that for the past week she has been experiencing intermittent episodes of chest "pressure" that would radiate up towards her neck.  She states that she had had on and off nausea and retching symptoms.  She thought that perhaps it was related to her job at a daycare.  However, this afternoon at approximately 4:30 PM she developed worsening central chest "pressure" that radiated towards her neck that lasted for 1 hour.  She states that it was associated with nausea, diaphoresis, and mild shortness of breath symptoms.  She states that she was working, but no obvious trigger.  It prompted her to come to the ED for evaluation.  She states that she has had indigestion and GERD before and that this feels different.  She also states this feels different from her heart attack from 2016.  She is currently chest pain-free and entirely asymptomatic.  HPI  Past Medical History:  Diagnosis Date   Anxiety and depression    CAD (coronary artery disease)    a. s/p STEMI 2016 s/p DES x 2 PDA with residual dz treated medically. b. [place] in 2020 with cath s/p DES to long high grade lesion in LAD, residual disease treated medically, EF 50%.   Diabetes mellitus, type II ([place])    Family history of adverse reaction to  anesthesia    PONV   Fibromyalgia    GERD (gastroesophageal reflux disease)    Hyperlipidemia    Hypertension    Ischemic cardiomyopathy    a. EF <25% in 2016, improved to normal thereafter. b. EF 50% in 05/2019.   Myocardial infarct [facility])    august 2016    Patient Active Problem List   Diagnosis Date Noted   Dysphagia 08/11/2020   Chronic diarrhea 08/11/2020   Screening for colorectal cancer 08/11/2020   GERD (gastroesophageal reflux disease) 08/11/2020   Unstable angina ([place]) 05/25/2019   CAD in native artery 05/24/2019   Diabetes mellitus ([place]) 07/06/2015   Acute heart failure ([place]) 07/06/2015   Essential hypertension 07/06/2015   Hyperlipidemia 07/06/2015   Fibromyalgia 07/06/2015   Acute ST elevation myocardial infarction (STEMI) involving other coronary artery of anterior wall ([place]) 07/05/2015   ST elevation myocardial infarction (STEMI) of inferior wall ([place])    Depression with anxiety 10/10/2013    Past Surgical History:  Procedure Laterality Date   BIOPSY  08/15/2020   Procedure: BIOPSY;  Surgeon: Harvel Quale, MD;  [facility];  Service: Gastroenterology;;   CARDIAC CATHETERIZATION N/A 07/05/2015   Procedure: Left Heart Cath and Coronary Angiography;  Surgeon: Lorretta Harp, MD;  Location: [place] CV LAB;  Service: Cardiovascular;  Laterality: N/A;   CESAREAN SECTION     COLONOSCOPY WITH PROPOFOL N/A 08/15/2020  Procedure: COLONOSCOPY WITH PROPOFOL;  Surgeon: Harvel Quale, MD;  Location: [facility];  Service: Gastroenterology;  Laterality: N/A;  1230   CORONARY STENT INTERVENTION N/A 05/24/2019   Procedure: CORONARY STENT INTERVENTION;  Surgeon: Lorretta Harp, MD;  Location: [place] CV LAB;  Service: Cardiovascular;  Laterality: N/A;   ESOPHAGEAL DILATION N/A 08/15/2020   Procedure: ESOPHAGEAL DILATION;  Surgeon: Harvel Quale, MD;  Location: [facility];  Service: Gastroenterology;  Laterality: N/A;    ESOPHAGOGASTRODUODENOSCOPY (EGD) WITH PROPOFOL N/A 08/15/2020   Procedure: ESOPHAGOGASTRODUODENOSCOPY (EGD) WITH PROPOFOL;  Surgeon: Harvel Quale, MD;  Location: [facility];  Service: Gastroenterology;  Laterality: N/A;   HERNIA REPAIR Left    x2 ventral and inguinal   LEFT HEART CATH AND CORONARY ANGIOGRAPHY N/A 05/24/2019   Procedure: LEFT HEART CATH AND CORONARY ANGIOGRAPHY;  Surgeon: Lorretta Harp, MD;  Location: [place] CV LAB;  Service: Cardiovascular;  Laterality: N/A;   POLYPECTOMY  08/15/2020   Procedure: POLYPECTOMY;  Surgeon: Harvel Quale, MD;  Location[facility];  Service: Gastroenterology;;   TONSILLECTOMY     TUBAL LIGATION       OB History   No obstetric history on file.     Family History  Problem Relation Age of Onset   Alcohol abuse Mother    Anxiety disorder Father    Depression Father    Alcohol abuse Father    Anxiety disorder Sister    Depression Sister    Depression Maternal Aunt    Depression Cousin     Social History   Tobacco Use   Smoking status: Never   Smokeless tobacco: Never  Substance Use Topics   Alcohol use: No    Comment: occasionally   Drug use: No    Home Medications Prior to Admission medications   Medication Sig Start Date End Date Taking? Authorizing Provider  acetaminophen (TYLENOL) 650 MG CR tablet Take 650-1,300 mg by mouth every 8 (eight) hours as needed for pain.     [provider]  APPLE CIDER VINEGAR PO Take 900 mg by mouth in the morning and at bedtime. 450 mg/tablet    [provider]  aspirin 81 MG chewable tablet Chew 1 tablet (81 mg total) by mouth daily. 05/15/19   Barrett, Evelene Croon, PA-C  atorvastatin (LIPITOR) 80 MG tablet TAKE 1 TABLET BY MOUTH ONCE DAILY AT  6PM 02/04/21   Verta Ellen., NP  carvedilol (COREG) 12.5 MG tablet Take 1 tablet (12.5 mg total) by mouth 2 (two) times daily. Patient not taking: Reported on 08/12/2020 06/09/20 09/07/20  Verta Ellen., NP  carvedilol (COREG) 25 MG tablet Take 25 mg by mouth 2 (two) times daily. 06/17/20   [provider]  clopidogrel (PLAVIX) 75 MG tablet Take 1 tablet (75 mg total) by mouth daily. 03/28/20   Arnoldo Lenis, MD  hydrochlorothiazide (MICROZIDE) 12.5 MG capsule TAKE 1 CAPSULE BY MOUTH ONCE DAILY-TAKE AN EXTRA CAPSULE DAILY AS NEEDED FOR WEIGHT GAIN OF 3 LBS IN ONE DAY OR 5 LBS IN ONE WEEK 11/25/20   Verta Ellen., NP  insulin NPH-regular Human (70-30) 100 UNIT/ML injection Inject 80 Units into the skin 2 (two) times a day.     [provider]  isosorbide mononitrate (IMDUR) 30 MG 24 hr tablet Take 1 tablet (30 mg total) by mouth daily. 02/18/20 08/12/21  Verta Ellen., NP  lisinopril (ZESTRIL) 20 MG tablet Take  1 tablet (20 mg total) by mouth daily. 02/18/20 08/12/21  Verta Ellen., NP  nitroGLYCERIN (NITROSTAT) 0.4 MG SL tablet Place 1 tablet (0.4 mg total) under the tongue every 5 (five) minutes x 3 doses as needed for chest pain (if no relief after 3rd dose, proceed to the ED for an evaluation or call 911). 05/15/19   Barrett, Evelene Croon, PA-C  omeprazole (PRILOSEC) 40 MG capsule Take 1 capsule (40 mg total) by mouth daily. 08/11/20   Harvel Quale, MD  sertraline (ZOLOFT) 100 MG tablet Take 150 mg by mouth at bedtime.  01/09/20   [provider]  topiramate (TOPAMAX) 25 MG tablet Take 25 mg by mouth in the morning and at bedtime. 07/16/20   [provider]  traZODone (DESYREL) 50 MG tablet Take 50 mg by mouth at bedtime.    [provider]    Allergies    Patient has no known allergies.  Review of Systems   Review of Systems  All other systems reviewed and are negative.  Physical Exam Updated Vital Signs BP (!) 159/77   Pulse 79   Temp 97.8 F (36.6 C) (Oral)   Resp 19   Ht 4\' 11"  (1.499 m)   Wt 83.9 kg   LMP 12/05/2015   SpO2 96%   BMI 37.37 kg/m   Physical Exam Vitals and nursing note reviewed.  Exam conducted with a chaperone present.  Constitutional:      Appearance: Normal appearance.  HENT:     Head: Normocephalic and atraumatic.  Eyes:     General: No scleral icterus.    Conjunctiva/sclera: Conjunctivae normal.  Cardiovascular:     Rate and Rhythm: Normal rate and regular rhythm.     Pulses: Normal pulses.  Pulmonary:     Effort: Pulmonary effort is normal. No respiratory distress.     Breath sounds: Normal breath sounds. No wheezing or rales.     Comments: CTA bilaterally.  No increased work of breathing. Musculoskeletal:     Cervical back: Normal range of motion.     Right lower leg: No edema.     Left lower leg: No edema.  Skin:    General: Skin is dry.  Neurological:     Mental Status: She is alert and oriented to person, place, and time.     GCS: GCS eye subscore is 4. GCS verbal subscore is 5. GCS motor subscore is 6.  Psychiatric:        Mood and Affect: Mood normal.        Behavior: Behavior normal.        Thought Content: Thought content normal.    ED Results / Procedures / Treatments   Labs (all labs ordered are listed, but only abnormal results are displayed) Labs Reviewed  BASIC METABOLIC PANEL - Abnormal; Notable for the following components:      Result Value   Sodium 133 (*)    CO2 20 (*)    Glucose, Bld 256 (*)    BUN 43 (*)    Creatinine, Ser 1.09 (*)    Calcium 8.6 (*)    All other components within normal limits  CBC - Abnormal; Notable for the following components:   Hemoglobin 11.3 (*)    MCH 24.8 (*)    RDW 16.9 (*)    All other components within normal limits  CBG MONITORING, ED - Abnormal; Notable for the following components:   Glucose-Capillary 230 (*)    All other  components within normal limits  TROPONIN I (HIGH SENSITIVITY) - Abnormal; Notable for the following components:   Troponin I (High Sensitivity) 66 (*)    All other components within normal limits  TROPONIN I (HIGH SENSITIVITY) - Abnormal; Notable for the  following components:   Troponin I (High Sensitivity) 68 (*)    All other components within normal limits  RESP PANEL BY RT-PCR (FLU A&B, COVID) [id number]  POC URINE PREG, ED    EKG EKG Interpretation  Date/Time:  Monday May 04 2021 19:01:52 EDT Ventricular Rate:  93 PR Interval:  146 QRS Duration: 76 QT Interval:  360 QTC Calculation: 447 R Axis:   30 Text Interpretation: Normal sinus rhythm Cannot rule out Anterior infarct , age undetermined Abnormal ECG st depression lateral leads, new since last tracing Confirmed by Dorie Rank [phone]) on 05/04/2021 7:04:57 PM  Radiology DG Chest 2 View  Result Date: 05/04/2021 CLINICAL DATA:  Chest tightness radiating up to the neck. Ongoing since last week. EXAM: CHEST - 2 VIEW COMPARISON:  02/13/2020 FINDINGS: The heart size and mediastinal contours are within normal limits. Both lungs are clear. The visualized skeletal structures are unremarkable. IMPRESSION: No active cardiopulmonary disease. Electronically Signed   By: Lucienne Capers M.D.   On: 05/04/2021 19:45    Procedures Procedures   Medications Ordered in ED Medications  nitroGLYCERIN (NITROSTAT) SL tablet 0.4 mg (has no administration in time range)  aspirin chewable tablet 324 mg (324 mg Oral Given 05/04/21 2015)    ED Course  I have reviewed the triage vital signs and the nursing notes.  Pertinent labs & imaging results that were available during my care of the patient were reviewed by me and considered in my medical decision making (see chart for details).  Clinical Course as of 05/04/21 2354  Mon May 04, 2021  2349 Spoke with Dr. Clearence Ped who will admit patient.  [GG]    Clinical Course User Index [GG] Corena Herter, PA-C   MDM Rules/Calculators/A&P HEAR Score: 6                        Amy Snow was evaluated in Emergency Department on 05/04/2021 for the symptoms described in the history of present illness. She was evaluated in the context of the global  COVID-19 pandemic, which necessitated consideration that the patient might be at risk for infection with the SARS-CoV-2 virus that causes COVID-19. Institutional protocols and algorithms that pertain to the evaluation of patients at risk for COVID-19 are in a state of rapid change based on information released by regulatory bodies including the CDC and federal and state organizations. These policies and algorithms were followed during the patient's care in the ED.  I personally reviewed patient's medical chart and all notes from triage and staff during today's encounter. I have also ordered and reviewed all labs and imaging that I felt to be medically necessary in the evaluation of this patient's complaints and with consideration of their physical exam. If needed, translation services were available and utilized.   Patient in the ED with complaints of central chest pain described as "pressure" that radiated to her neck and was associated with nausea, diaphoresis, and shortness of breath.  She states that she had been taking sublingual nitroglycerin last week that had improved her episodes of intermittent chest discomfort that presented similarly.  Her laboratory work-up is notable for positive troponin to 66.  I reviewed her medical record and she has  had negative troponins in the past during her cardiac work-ups.  We will continue to trend.  She is chest pain-free at this time.  However, given positive troponin in the setting of concerning story for ACS an individual with elevated heart score and prior STEMI, will consult cardiology for admission.  She is diabetic, but sugars are not particularly poorly controlled here in the ED and feel as though they may be able to directly admit.  Patient with elevated heart score of 6.  She is at the high end of admission for observation versus discharge with close outpatient follow-up.  Given her history, air on the side of caution would prefer hospitalist or  cardiologist admission.  Discussed case with Dr. Tomi Bamberger who agrees with assessment and plan.  I spoke with cardiology, Dr. Renella Cunas, who states that given her elevated troponins are stable, no need for emergent transfer for cardiac catheterization.  However, she would benefit from hospitalist admission to [facility] given that she may still require more invasive repeat coronary assessment.  Cardiology team will consult when she is admitted to [facility].  Spoke with Dr. Clearence Ped who will admit patient.   Final Clinical Impression(s) / ED Diagnoses Final diagnoses:  Elevated troponin    Rx / DC Orders ED Discharge Orders     None        Corena Herter, PA-C 05/04/21 2354    Dorie Rank, MD 05/05/21 1017

## 2021-05-04 NOTE — ED Provider Notes (Signed)
Emergency Medicine Provider Triage Evaluation Note  Amy Snow , a 55 y.o. female  was evaluated in triage.  Pt complains of chest discomfort.  She does have history of heart disease.  The pain is rating up into her throat  Review of Systems  Positive: Chest pain Negative: No fevers  Physical Exam  BP (!) 148/86 (BP Location: Right Arm)   Pulse 94   Temp 97.8 F (36.6 C) (Oral)   Resp 17   Ht 1.499 m (4\' 11" )   Wt 83.9 kg   LMP 12/05/2015   SpO2 98%   BMI 37.37 kg/m  Gen:   Awake, no distress   Resp:  Normal effort  MSK:   Moves extremities without difficulty  Other:  Cardiac regular rate and rhythm on auscultation  Medical Decision Making  Medically screening exam initiated at 7:27 PM.  Appropriate orders placed.  Amy Snow was informed that the remainder of the evaluation will be completed by another provider, this initial triage assessment does not replace that evaluation, and the importance of remaining in the ED until their evaluation is complete.  Symptoms concerning for acute coronary syndrome.  We will check laboratory tests.  Plan on aspirin and nitroglycerin.  Initial EKG did not show ST elevation MI   Dorie Rank, MD 05/04/21 1930

## 2021-05-05 ENCOUNTER — Encounter (HOSPITAL_COMMUNITY): Payer: Self-pay | Admitting: Family Medicine

## 2021-05-05 ENCOUNTER — Ambulatory Visit (HOSPITAL_COMMUNITY)
Admission: EM | Disposition: A | Discharge status: Home / Self Care | Payer: Self-pay | Source: Home / Self Care | Attending: Emergency Medicine

## 2021-05-05 DIAGNOSIS — E785 Hyperlipidemia, unspecified: Secondary | ICD-10-CM

## 2021-05-05 DIAGNOSIS — R778 Other specified abnormalities of plasma proteins: Secondary | ICD-10-CM | POA: Diagnosis not present

## 2021-05-05 DIAGNOSIS — F418 Other specified anxiety disorders: Secondary | ICD-10-CM | POA: Diagnosis not present

## 2021-05-05 DIAGNOSIS — E1169 Type 2 diabetes mellitus with other specified complication: Secondary | ICD-10-CM

## 2021-05-05 DIAGNOSIS — R079 Chest pain, unspecified: Secondary | ICD-10-CM

## 2021-05-05 DIAGNOSIS — I10 Essential (primary) hypertension: Secondary | ICD-10-CM

## 2021-05-05 DIAGNOSIS — I2510 Atherosclerotic heart disease of native coronary artery without angina pectoris: Secondary | ICD-10-CM

## 2021-05-05 HISTORY — PX: LEFT HEART CATH AND CORONARY ANGIOGRAPHY: CATH118249

## 2021-05-05 LAB — LIPID PANEL
Cholesterol: 322 mg/dL — ABNORMAL HIGH (ref 0–200)
HDL: 29 mg/dL — ABNORMAL LOW (ref >40)
LDL Cholesterol: UNDETERMINED mg/dL (ref 0–99)
Total CHOL/HDL Ratio: 11.1 RATIO
Triglycerides: 851 mg/dL — ABNORMAL HIGH (ref <150)
VLDL: UNDETERMINED mg/dL (ref 0–40)

## 2021-05-05 LAB — GLUCOSE, CAPILLARY
Glucose-Capillary: 203 mg/dL — ABNORMAL HIGH (ref 70–99)
Glucose-Capillary: 231 mg/dL — ABNORMAL HIGH (ref 70–99)
Glucose-Capillary: 264 mg/dL — ABNORMAL HIGH (ref 70–99)
Glucose-Capillary: 136 mg/dL — ABNORMAL HIGH (ref 70–99)
Glucose-Capillary: 150 mg/dL — ABNORMAL HIGH (ref 70–99)
Glucose-Capillary: 171 mg/dL — ABNORMAL HIGH (ref 70–99)
Glucose-Capillary: 299 mg/dL — ABNORMAL HIGH (ref 70–99)

## 2021-05-05 LAB — CBC
HCT: 33.7 % — ABNORMAL LOW (ref 36.0–46.0)
Hemoglobin: 10.3 g/dL — ABNORMAL LOW (ref 12.0–15.0)
MCH: 24.4 pg — ABNORMAL LOW (ref 26.0–34.0)
MCHC: 30.6 g/dL (ref 30.0–36.0)
MCV: 79.9 fL — ABNORMAL LOW (ref 80.0–100.0)
Platelets: 187 10*3/uL (ref 150–400)
RBC: 4.22 MIL/uL (ref 3.87–5.11)
RDW: 16.7 % — ABNORMAL HIGH (ref 11.5–15.5)
WBC: 4.6 10*3/uL (ref 4.0–10.5)
nRBC: 0 % (ref 0.0–0.2)

## 2021-05-05 LAB — TROPONIN I (HIGH SENSITIVITY): Troponin I (High Sensitivity): 68 ng/L — ABNORMAL HIGH (ref <18)

## 2021-05-05 LAB — POCT ACTIVATED CLOTTING TIME: Activated Clotting Time: 86 seconds

## 2021-05-05 LAB — CREATININE, SERUM
Creatinine, Ser: 0.65 mg/dL (ref 0.44–1.00)
GFR, Estimated: >60 mL/min (ref >60)

## 2021-05-05 LAB — HEPARIN LEVEL (UNFRACTIONATED): Heparin Unfractionated: 0.31 IU/mL (ref 0.30–0.70)

## 2021-05-05 LAB — HIV ANTIBODY (ROUTINE TESTING W REFLEX): HIV Screen 4th Generation wRfx: NONREACTIVE

## 2021-05-05 LAB — LDL CHOLESTEROL, DIRECT: Direct LDL: 123.1 mg/dL — ABNORMAL HIGH (ref 0–99)

## 2021-05-05 SURGERY — LEFT HEART CATH AND CORONARY ANGIOGRAPHY
Anesthesia: LOCAL

## 2021-05-05 MED ORDER — INSULIN DETEMIR 100 UNIT/ML ~~LOC~~ SOLN
12.0000 [IU] | Freq: Two times a day (BID) | SUBCUTANEOUS | Status: DC
  Administered 2021-05-05 – 2021-05-06 (×3): 12 [IU] via SUBCUTANEOUS
  Filled 2021-05-05 (×6): qty 0.12

## 2021-05-05 MED ORDER — CLOPIDOGREL BISULFATE 75 MG PO TABS
75.0000 mg | ORAL_TABLET | Freq: Every day | ORAL | Status: DC
  Administered 2021-05-05 – 2021-05-06 (×2): 75 mg via ORAL
  Filled 2021-05-05 (×2): qty 1

## 2021-05-05 MED ORDER — MIDAZOLAM HCL 2 MG/2ML IJ SOLN
INTRAMUSCULAR | Status: AC
  Filled 2021-05-05: qty 2

## 2021-05-05 MED ORDER — HEPARIN SODIUM (PORCINE) 1000 UNIT/ML IJ SOLN
INTRAMUSCULAR | Status: AC
  Filled 2021-05-05: qty 1

## 2021-05-05 MED ORDER — PANTOPRAZOLE SODIUM 40 MG PO TBEC
40.0000 mg | DELAYED_RELEASE_TABLET | Freq: Every day | ORAL | Status: DC
  Administered 2021-05-05 – 2021-05-06 (×2): 40 mg via ORAL
  Filled 2021-05-05 (×2): qty 1

## 2021-05-05 MED ORDER — HEPARIN (PORCINE) IN NACL 1000-0.9 UT/500ML-% IV SOLN
INTRAVENOUS | Status: AC
  Filled 2021-05-05: qty 1000

## 2021-05-05 MED ORDER — LIDOCAINE HCL (PF) 1 % IJ SOLN
INTRAMUSCULAR | Status: DC | PRN
  Administered 2021-05-05: 20 mL
  Administered 2021-05-05: 2 mL

## 2021-05-05 MED ORDER — ONDANSETRON HCL 4 MG/2ML IJ SOLN: 4.0000 mg | Freq: Four times a day (QID) | INTRAMUSCULAR | Status: DC | PRN

## 2021-05-05 MED ORDER — TRAZODONE HCL 50 MG PO TABS
50.0000 mg | ORAL_TABLET | Freq: Every day | ORAL | Status: DC
  Administered 2021-05-05 (×2): 50 mg via ORAL
  Filled 2021-05-05 (×2): qty 1

## 2021-05-05 MED ORDER — ASPIRIN 81 MG PO CHEW
81.0000 mg | CHEWABLE_TABLET | Freq: Every day | ORAL | Status: DC
  Administered 2021-05-05: 81 mg via ORAL
  Filled 2021-05-05: qty 1

## 2021-05-05 MED ORDER — SODIUM CHLORIDE 0.9 % WEIGHT BASED INFUSION
3.0000 mL/kg/h | INTRAVENOUS | Status: AC
  Administered 2021-05-05: 3 mL/kg/h via INTRAVENOUS

## 2021-05-05 MED ORDER — INSULIN ASPART 100 UNIT/ML IJ SOLN
0.0000 [IU] | Freq: Three times a day (TID) | INTRAMUSCULAR | Status: DC
  Administered 2021-05-05: 5 [IU] via SUBCUTANEOUS
  Administered 2021-05-05: 8 [IU] via SUBCUTANEOUS
  Administered 2021-05-06: 3 [IU] via SUBCUTANEOUS
  Administered 2021-05-06: 5 [IU] via SUBCUTANEOUS

## 2021-05-05 MED ORDER — ASPIRIN 81 MG PO CHEW
81.0000 mg | CHEWABLE_TABLET | Freq: Every day | ORAL | Status: DC
  Administered 2021-05-06: 81 mg via ORAL
  Filled 2021-05-05: qty 1

## 2021-05-05 MED ORDER — HYDROCHLOROTHIAZIDE 12.5 MG PO CAPS
12.5000 mg | ORAL_CAPSULE | Freq: Every day | ORAL | Status: DC
  Filled 2021-05-05: qty 1

## 2021-05-05 MED ORDER — ENOXAPARIN SODIUM 40 MG/0.4ML IJ SOSY
40.0000 mg | PREFILLED_SYRINGE | INTRAMUSCULAR | Status: DC
  Administered 2021-05-06: 40 mg via SUBCUTANEOUS
  Filled 2021-05-05: qty 0.4

## 2021-05-05 MED ORDER — ORAL CARE MOUTH RINSE
15.0000 mL | Freq: Two times a day (BID) | OROMUCOSAL | Status: DC
  Administered 2021-05-05 – 2021-05-06 (×2): 15 mL via OROMUCOSAL

## 2021-05-05 MED ORDER — SODIUM CHLORIDE 0.9 % IV SOLN
INTRAVENOUS | Status: AC | PRN
  Administered 2021-05-05: 85 mL/h via INTRAVENOUS

## 2021-05-05 MED ORDER — ISOSORBIDE MONONITRATE ER 30 MG PO TB24
30.0000 mg | ORAL_TABLET | Freq: Every day | ORAL | Status: DC
  Administered 2021-05-05: 30 mg via ORAL
  Filled 2021-05-05: qty 1

## 2021-05-05 MED ORDER — ATORVASTATIN CALCIUM 80 MG PO TABS: 80.0000 mg | ORAL_TABLET | Freq: Every day | ORAL | Status: DC

## 2021-05-05 MED ORDER — MIDAZOLAM HCL 2 MG/2ML IJ SOLN
INTRAMUSCULAR | Status: DC | PRN
  Administered 2021-05-05: 2 mg via INTRAVENOUS
  Administered 2021-05-05: 1 mg via INTRAVENOUS

## 2021-05-05 MED ORDER — ALPRAZOLAM 0.5 MG PO TABS: 1.0000 mg | ORAL_TABLET | Freq: Two times a day (BID) | ORAL | Status: DC | PRN

## 2021-05-05 MED ORDER — HEPARIN (PORCINE) 25000 UT/250ML-% IV SOLN
800.0000 [IU]/h | INTRAVENOUS | Status: DC
  Administered 2021-05-05: 800 [IU]/h via INTRAVENOUS
  Filled 2021-05-05: qty 250

## 2021-05-05 MED ORDER — ISOSORBIDE MONONITRATE ER 60 MG PO TB24
60.0000 mg | ORAL_TABLET | Freq: Every day | ORAL | Status: DC
  Administered 2021-05-06: 60 mg via ORAL
  Filled 2021-05-05: qty 1

## 2021-05-05 MED ORDER — CLOPIDOGREL BISULFATE 75 MG PO TABS: 75.0000 mg | ORAL_TABLET | Freq: Every day | ORAL | Status: DC

## 2021-05-05 MED ORDER — SODIUM CHLORIDE 0.9 % WEIGHT BASED INFUSION: 1.0000 mL/kg/h | INTRAVENOUS | Status: DC

## 2021-05-05 MED ORDER — LIDOCAINE HCL (PF) 1 % IJ SOLN
INTRAMUSCULAR | Status: AC
  Filled 2021-05-05: qty 30

## 2021-05-05 MED ORDER — SODIUM CHLORIDE 0.9 % IV SOLN: INTRAVENOUS | Status: AC

## 2021-05-05 MED ORDER — SODIUM CHLORIDE 0.9 % IV SOLN: 250.0000 mL | INTRAVENOUS | Status: DC | PRN

## 2021-05-05 MED ORDER — LISINOPRIL 20 MG PO TABS
20.0000 mg | ORAL_TABLET | Freq: Every day | ORAL | Status: DC
  Administered 2021-05-05 – 2021-05-06 (×2): 20 mg via ORAL
  Filled 2021-05-05: qty 1
  Filled 2021-05-05: qty 2

## 2021-05-05 MED ORDER — FENTANYL CITRATE (PF) 100 MCG/2ML IJ SOLN
INTRAMUSCULAR | Status: AC
  Filled 2021-05-05: qty 2

## 2021-05-05 MED ORDER — HEPARIN BOLUS VIA INFUSION
3800.0000 [IU] | Freq: Once | INTRAVENOUS | Status: AC
  Administered 2021-05-05: 3800 [IU] via INTRAVENOUS
  Filled 2021-05-05: qty 3800

## 2021-05-05 MED ORDER — INSULIN ASPART 100 UNIT/ML IJ SOLN
0.0000 [IU] | Freq: Every day | INTRAMUSCULAR | Status: DC
  Administered 2021-05-05: 2 [IU] via SUBCUTANEOUS
  Administered 2021-05-05: 3 [IU] via SUBCUTANEOUS

## 2021-05-05 MED ORDER — SODIUM CHLORIDE 0.9% FLUSH
3.0000 mL | Freq: Two times a day (BID) | INTRAVENOUS | Status: DC
  Administered 2021-05-06: 3 mL via INTRAVENOUS

## 2021-05-05 MED ORDER — CARVEDILOL 25 MG PO TABS
25.0000 mg | ORAL_TABLET | Freq: Two times a day (BID) | ORAL | Status: DC
  Administered 2021-05-05 – 2021-05-06 (×4): 25 mg via ORAL
  Filled 2021-05-05 (×2): qty 2
  Filled 2021-05-05 (×2): qty 1

## 2021-05-05 MED ORDER — ENOXAPARIN SODIUM 40 MG/0.4ML IJ SOSY
40.0000 mg | PREFILLED_SYRINGE | INTRAMUSCULAR | Status: DC
  Administered 2021-05-05: 40 mg via SUBCUTANEOUS
  Filled 2021-05-05: qty 0.4

## 2021-05-05 MED ORDER — ATORVASTATIN CALCIUM 80 MG PO TABS
80.0000 mg | ORAL_TABLET | Freq: Every day | ORAL | Status: DC
  Administered 2021-05-05 – 2021-05-06 (×2): 80 mg via ORAL
  Filled 2021-05-05: qty 2
  Filled 2021-05-05: qty 1

## 2021-05-05 MED ORDER — ACETAMINOPHEN 325 MG PO TABS: 650.0000 mg | ORAL_TABLET | ORAL | Status: DC | PRN

## 2021-05-05 MED ORDER — DIAZEPAM 5 MG PO TABS
5.0000 mg | ORAL_TABLET | Freq: Four times a day (QID) | ORAL | Status: DC | PRN
  Administered 2021-05-05: 5 mg via ORAL
  Filled 2021-05-05: qty 1

## 2021-05-05 MED ORDER — HEPARIN (PORCINE) IN NACL 1000-0.9 UT/500ML-% IV SOLN
INTRAVENOUS | Status: DC | PRN
  Administered 2021-05-05 (×2): 500 mL

## 2021-05-05 MED ORDER — LABETALOL HCL 5 MG/ML IV SOLN: 10.0000 mg | INTRAVENOUS | Status: AC | PRN

## 2021-05-05 MED ORDER — SERTRALINE HCL 50 MG PO TABS
150.0000 mg | ORAL_TABLET | Freq: Every day | ORAL | Status: DC
  Administered 2021-05-05 (×2): 150 mg via ORAL
  Filled 2021-05-05: qty 3
  Filled 2021-05-05: qty 1

## 2021-05-05 MED ORDER — SODIUM CHLORIDE 0.9% FLUSH: 3.0000 mL | INTRAVENOUS | Status: DC | PRN

## 2021-05-05 MED ORDER — VERAPAMIL HCL 2.5 MG/ML IV SOLN
INTRAVENOUS | Status: AC
  Filled 2021-05-05: qty 2

## 2021-05-05 MED ORDER — HYDRALAZINE HCL 20 MG/ML IJ SOLN: 10.0000 mg | INTRAMUSCULAR | Status: AC | PRN

## 2021-05-05 MED ORDER — FENTANYL CITRATE (PF) 100 MCG/2ML IJ SOLN
INTRAMUSCULAR | Status: DC | PRN
  Administered 2021-05-05 (×2): 25 ug via INTRAVENOUS

## 2021-05-05 SURGICAL SUPPLY — 10 items
CATH INFINITI 5FR MULTPACK ANG (CATHETERS) ×2 IMPLANT
GLIDESHEATH SLEND SS 6F .021 (SHEATH) ×2 IMPLANT
GUIDEWIRE INQWIRE 1.5J.035X260 (WIRE) IMPLANT
INQWIRE 1.5J .035X260CM (WIRE)
KIT HEART LEFT (KITS) ×2 IMPLANT
PACK CARDIAC CATHETERIZATION (CUSTOM PROCEDURE TRAY) ×2 IMPLANT
SHEATH PINNACLE 5F 10CM (SHEATH) ×2 IMPLANT
TRANSDUCER W/STOPCOCK (MISCELLANEOUS) ×2 IMPLANT
TUBING CIL FLEX 10 FLL-RA (TUBING) ×2 IMPLANT
WIRE EMERALD 3MM-J .035X260CM (WIRE) ×2 IMPLANT

## 2021-05-05 NOTE — Progress Notes (Signed)
ANTICOAGULATION CONSULT NOTE - Initial Consult  Pharmacy Consult for Heparin Indication: chest pain/ACS  No Known Allergies  Patient Measurements: Height: 4\' 11"  (149.9 cm) Weight: 84.5 kg (186 lb 4.6 oz) IBW/kg (Calculated) : 43.2 HEPARIN DW (KG): 63.2   Vital Signs: Temp: 98 F (36.7 C) (06/14 0537) Temp Source: Oral (06/14 0537) BP: 103/44 (06/14 0537) Pulse Rate: 73 (06/14 0537)  Labs: Recent Labs    05/04/21 2004 05/04/21 2206 05/05/21 0315  HGB 11.3*  --   --   HCT 36.8  --   --   PLT 212  --   --   CREATININE 1.09*  --   --   TROPONINIHS 66* 68* 68*    Estimated Creatinine Clearance: 56.3 mL/min (A) (by C-G formula based on SCr of 1.09 mg/dL (H)).   Medical History: Past Medical History:  Diagnosis Date   Anxiety and depression    CAD (coronary artery disease)    a. s/p STEMI 2016 s/p DES x 2 PDA with residual dz treated medically. b. [place] in 2020 with cath s/p DES to long high grade lesion in LAD, residual disease treated medically, EF 50%.   Diabetes mellitus, type II ([place])    Family history of adverse reaction to anesthesia    PONV   Fibromyalgia    GERD (gastroesophageal reflux disease)    Hyperlipidemia    Hypertension    Ischemic cardiomyopathy    a. EF <25% in 2016, improved to normal thereafter. b. EF 50% in 05/2019.   Myocardial infarct [facility])    august 2016    Medications:  Medications Prior to Admission  Medication Sig Dispense Refill Last Dose   acetaminophen (TYLENOL) 650 MG CR tablet Take 650-1,300 mg by mouth every 8 (eight) hours as needed for pain.       amitriptyline (ELAVIL) 25 MG tablet Take 25 mg by mouth at bedtime.   Past Week   APPLE CIDER VINEGAR PO Take 900 mg by mouth in the morning and at bedtime. 450 mg/tablet   05/04/2021   aspirin 81 MG chewable tablet Chew 1 tablet (81 mg total) by mouth daily. 90 tablet 3 05/04/2021 at 0800   atorvastatin (LIPITOR) 80 MG tablet TAKE 1 TABLET BY MOUTH ONCE DAILY AT  6PM 7 tablet 0 Past  Week   carvedilol (COREG) 25 MG tablet Take 25 mg by mouth 2 (two) times daily.   05/04/2021 at 0800   clopidogrel (PLAVIX) 75 MG tablet Take 1 tablet (75 mg total) by mouth daily. 90 tablet 3 05/04/2021 at 0800   hydrochlorothiazide (MICROZIDE) 12.5 MG capsule TAKE 1 CAPSULE BY MOUTH ONCE DAILY-TAKE AN EXTRA CAPSULE DAILY AS NEEDED FOR WEIGHT GAIN OF 3 LBS IN ONE DAY OR 5 LBS IN ONE WEEK 90 capsule 0 05/04/2021   insulin NPH-regular Human (70-30) 100 UNIT/ML injection Inject 80 Units into the skin 2 (two) times a day.    05/04/2021   isosorbide mononitrate (IMDUR) 30 MG 24 hr tablet Take 1 tablet (30 mg total) by mouth daily. 90 tablet 3 05/04/2021   lisinopril (ZESTRIL) 20 MG tablet Take 1 tablet (20 mg total) by mouth daily. 90 tablet 3 05/04/2021   omeprazole (PRILOSEC) 40 MG capsule Take 1 capsule (40 mg total) by mouth daily. 90 capsule 3 05/04/2021   sertraline (ZOLOFT) 100 MG tablet Take 150 mg by mouth at bedtime.    05/04/2021   traZODone (DESYREL) 50 MG tablet Take 50 mg by mouth at bedtime.   Past  Week   carvedilol (COREG) 12.5 MG tablet Take 1 tablet (12.5 mg total) by mouth 2 (two) times daily. (Patient not taking: Reported on 08/12/2020) 180 tablet 1    nitroGLYCERIN (NITROSTAT) 0.4 MG SL tablet Place 1 tablet (0.4 mg total) under the tongue every 5 (five) minutes x 3 doses as needed for chest pain (if no relief after 3rd dose, proceed to the ED for an evaluation or call 911). 25 tablet 3    topiramate (TOPAMAX) 25 MG tablet Take 25 mg by mouth in the morning and at bedtime. (Patient not taking: Reported on 05/05/2021)   Not Taking    Assessment: Patient presents to the ED with chest pain, it radiates up to her neck and through to her back.  It feels like a pinching tightness and pressure.  Pharmacy asked to start heparin. She is not on oral anticoagulants at home.  Goal of Therapy:  Heparin level 0.3-0.7 units/ml Monitor platelets by anticoagulation protocol: Yes   Plan:  Give 3800 units  bolus x 1 Start heparin infusion at 800 units/hr Check anti-Xa level in ~6 hours and daily while on heparin Continue to monitor H&H and platelets  Isac Sarna, BS [place], BCPS Clinical Pharmacist Pager [phone] 05/05/2021,8:44 AM

## 2021-05-05 NOTE — H&P (Signed)
TRH H&P    Patient Demographics:    Amy Snow, is a 55 y.o. female  MRN: [id number]  DOB - 11/10/1966  Admit Date - 05/04/2021  Referring MD/NP/PA: Tomi Bamberger  Outpatient Primary MD for the patient is Wyatt Haste, NP  Patient coming from: Home  Chief complaint- Chest pain   HPI:    Amy Snow  is a 55 y.o. female, with history of anxiety and depression, coronary artery disease, diabetes mellitus type 2, GERD, hyperlipidemia, hypertension, ischemic cardiomyopathy, and more presents to the ED with a chief complaint of chest pain.  Patient reports that he has been seen intermittently last week, for which you spoke to her cardiologist about.  She reports that cardiology clinic called her for some reason today, and she was actively having chest pain and they advised her to go to the ED.  Patient reports that the chest pain is diffusely across her chest.  It radiates up to her neck and through to her back.  It feels like a pinching tightness and pressure.  She reports that she is pain-free at time of admission.  She is not sure what resolved the pain.  Although the pain was intermittent last week it was more intense today.  It started while she was standing signing paperwork.  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  She reports associated nausea, dyspnea, and diaphoresis.  She denies any dizziness, near syncopal episodes, or palpitations.  She denies any peripheral edema.  She reports no fever.  She reports that she does have dry heaves over the last week, but is otherwise in her normal state of health.  Patient reports that as part of her normal state of health she does have left calf cramping.  No history of long trips, hormone use, cancer.  No hemoptysis.  No family history or personal history of blood clots.  No asymmetrical swelling.  Patient does not smoke, drinks alcohol  approximately once per month, does not use illicit drugs, is not vaccinated for COVID.  Patient is full code.  In the ED Temp 97.8, heart rate 79-94, respiratory rate 17-27, blood pressure 159/77, satting at 94 to 98% Troponins are flat at 66 and 68 No leukocytosis, hemoglobin 11.3 Chemistry panel is mostly unremarkable with a hyperglycemia at 256 Negative pregnancy test negative respiratory panel 324 mg of aspirin given in the ED, and sublingual nitro x1 Cardiology consulted: Reports patient had prior STEMI in 2016 with drug-eluting stent x2.  ED visit to [facility] with ACS eval unremarkable.  Multiple coronary artery disease risk factors include prior STEMI, diabetes mellitus type 2, hyperlipidemia, hypertension.  EKG reviewed and noted to have 1 mm depression in the lateral leads.   ED provider reported that he that cardiology had agreed that patient was safe to stay here at [facility] with cardiology consult.  Looks like the note advised possible cath at [facility].  We will continue to follow troponins and if they are increasing will likely need transfer to [facility].  Review of systems:    In addition to the HPI above,  No Fever-chills, No Headache, No changes with Vision or hearing, No problems swallowing food or Liquids, Admits to chest pain, shortness of breath No Abdominal pain, admits to nausea no vomiting, bowel movements are regular, No Blood in stool or Urine, No dysuria, No new skin rashes or bruises, No new joints pains-aches,  No new weakness, tingling, numbness in any extremity, No recent weight gain or loss, No polyuria, polydypsia or polyphagia, No significant Mental Stressors.  All other systems reviewed and are negative.    Past History of the following :    Past Medical History:  Diagnosis Date   Anxiety and depression    CAD (coronary artery disease)    a. s/p STEMI 2016 s/p DES x 2 PDA with residual dz treated medically. b. [place] in 2020 with cath  s/p DES to long high grade lesion in LAD, residual disease treated medically, EF 50%.   Diabetes mellitus, type II ([place])    Family history of adverse reaction to anesthesia    PONV   Fibromyalgia    GERD (gastroesophageal reflux disease)    Hyperlipidemia    Hypertension    Ischemic cardiomyopathy    a. EF <25% in 2016, improved to normal thereafter. b. EF 50% in 05/2019.   Myocardial infarct [facility])    august 2016      Past Surgical History:  Procedure Laterality Date   BIOPSY  08/15/2020   Procedure: BIOPSY;  Surgeon: Harvel Quale, MD;  [facility];  Service: Gastroenterology;;   CARDIAC CATHETERIZATION N/A 07/05/2015   Procedure: Left Heart Cath and Coronary Angiography;  Surgeon: Lorretta Harp, MD;  Location: [place] CV LAB;  Service: Cardiovascular;  Laterality: N/A;   CESAREAN SECTION     COLONOSCOPY WITH PROPOFOL N/A 08/15/2020   Procedure: COLONOSCOPY WITH PROPOFOL;  Surgeon: Harvel Quale, MD;  Location[facility];  Service: Gastroenterology;  Laterality: N/A;  1230   CORONARY STENT INTERVENTION N/A 05/24/2019   Procedure: CORONARY STENT INTERVENTION;  Surgeon: Lorretta Harp, MD;  Location: [place] CV LAB;  Service: Cardiovascular;  Laterality: N/A;   ESOPHAGEAL DILATION N/A 08/15/2020   Procedure: ESOPHAGEAL DILATION;  Surgeon: Harvel Quale, MD;  [facility];  Service: Gastroenterology;  Laterality: N/A;   ESOPHAGOGASTRODUODENOSCOPY (EGD) WITH PROPOFOL N/A 08/15/2020   Procedure: ESOPHAGOGASTRODUODENOSCOPY (EGD) WITH PROPOFOL;  Surgeon: Harvel Quale, MD;  [facility];  Service: Gastroenterology;  Laterality: N/A;   HERNIA REPAIR Left    x2 ventral and inguinal   LEFT HEART CATH AND CORONARY ANGIOGRAPHY N/A 05/24/2019   Procedure: LEFT HEART CATH AND CORONARY ANGIOGRAPHY;  Surgeon: Lorretta Harp, MD;  Location: [place] [facility] LAB;  Service: Cardiovascular;  Laterality: N/A;    POLYPECTOMY  08/15/2020   Procedure: POLYPECTOMY;  Surgeon: Harvel Quale, MD;  [facility];  Service: Gastroenterology;;   TONSILLECTOMY     TUBAL LIGATION        Social History:      Social History   Tobacco Use   Smoking status: Never   Smokeless tobacco: Never  Substance Use Topics   Alcohol use: No    Comment: occasionally       Family History :     Family History  Problem Relation Age of Onset   Alcohol abuse Mother    Anxiety disorder Father    Depression Father  Alcohol abuse Father    Anxiety disorder Sister    Depression Sister    Depression Maternal Aunt    Depression Cousin       Home Medications:   Prior to Admission medications   Medication Sig Start Date End Date Taking? Authorizing Provider  acetaminophen (TYLENOL) 650 MG CR tablet Take 650-1,300 mg by mouth every 8 (eight) hours as needed for pain.     [provider]  APPLE CIDER VINEGAR PO Take 900 mg by mouth in the morning and at bedtime. 450 mg/tablet    [provider]  aspirin 81 MG chewable tablet Chew 1 tablet (81 mg total) by mouth daily. 05/15/19   Barrett, Evelene Croon, PA-C  atorvastatin (LIPITOR) 80 MG tablet TAKE 1 TABLET BY MOUTH ONCE DAILY AT  6PM 02/04/21   Verta Ellen., NP  carvedilol (COREG) 12.5 MG tablet Take 1 tablet (12.5 mg total) by mouth 2 (two) times daily. Patient not taking: Reported on 08/12/2020 06/09/20 09/07/20  Verta Ellen., NP  carvedilol (COREG) 25 MG tablet Take 25 mg by mouth 2 (two) times daily. 06/17/20   [provider]  clopidogrel (PLAVIX) 75 MG tablet Take 1 tablet (75 mg total) by mouth daily. 03/28/20   Arnoldo Lenis, MD  hydrochlorothiazide (MICROZIDE) 12.5 MG capsule TAKE 1 CAPSULE BY MOUTH ONCE DAILY-TAKE AN EXTRA CAPSULE DAILY AS NEEDED FOR WEIGHT GAIN OF 3 LBS IN ONE DAY OR 5 LBS IN ONE WEEK 11/25/20   Verta Ellen., NP  insulin NPH-regular Human (70-30) 100 UNIT/ML injection Inject 80  Units into the skin 2 (two) times a day.     [provider]  isosorbide mononitrate (IMDUR) 30 MG 24 hr tablet Take 1 tablet (30 mg total) by mouth daily. 02/18/20 08/12/21  Verta Ellen., NP  lisinopril (ZESTRIL) 20 MG tablet Take 1 tablet (20 mg total) by mouth daily. 02/18/20 08/12/21  Verta Ellen., NP  nitroGLYCERIN (NITROSTAT) 0.4 MG SL tablet Place 1 tablet (0.4 mg total) under the tongue every 5 (five) minutes x 3 doses as needed for chest pain (if no relief after 3rd dose, proceed to the ED for an evaluation or call 911). 05/15/19   Barrett, Evelene Croon, PA-C  omeprazole (PRILOSEC) 40 MG capsule Take 1 capsule (40 mg total) by mouth daily. 08/11/20   Harvel Quale, MD  sertraline (ZOLOFT) 100 MG tablet Take 150 mg by mouth at bedtime.  01/09/20   [provider]  topiramate (TOPAMAX) 25 MG tablet Take 25 mg by mouth in the morning and at bedtime. 07/16/20   [provider]  traZODone (DESYREL) 50 MG tablet Take 50 mg by mouth at bedtime.    [provider]     Allergies:    No Known Allergies   Physical Exam:   Vitals  Blood pressure (!) 161/84, pulse 79, temperature 97.7 F (36.5 C), temperature source Oral, resp. rate 20, height 4\' 11"  (1.499 m), weight 84.5 kg, last menstrual period 12/05/2015, SpO2 97 %.  1.  General: Patient lying supine in bed,  no acute distress   2. Psychiatric: Alert and oriented x 3, mood and behavior normal for situation, pleasant and cooperative with exam   3. Neurologic: Speech and language are normal, face is symmetric, moves all 4 extremities voluntarily, at baseline without acute deficits on limited exam   4. HEENMT:  Head is atraumatic, normocephalic, pupils reactive to light, neck is supple,  trachea is midline, mucous membranes are moist   5. Respiratory : Lungs are clear to auscultation bilaterally without wheezing, rhonchi, rales, no cyanosis, no increase in work of breathing or accessory  muscle use   6. Cardiovascular : Heart rate normal, rhythm is regular, no murmurs, rubs or gallops, no peripheral edema, peripheral pulses palpated   7. Gastrointestinal:  Abdomen is soft, nondistended, nontender to palpation bowel sounds active, no masses or organomegaly palpated   8. Skin:  Skin is warm, dry and intact without rashes, acute lesions, or ulcers on limited exam   9.Musculoskeletal:  No acute deformities or trauma, no asymmetry in tone, no peripheral edema, peripheral pulses palpated, no tenderness to palpation in the extremities   Data Review:    CBC Recent Labs  Lab 05/04/21 2004  WBC 6.6  HGB 11.3*  HCT 36.8  PLT 212  MCV 80.9  MCH 24.8*  MCHC 30.7  RDW 16.9*   ------------------------------------------------------------------------------------------------------------------  Results for orders placed or performed during the hospital encounter of 05/04/21 (from the past 48 hour(s))  Basic metabolic panel     Status: Abnormal   Collection Time: 05/04/21  8:04 PM  Result Value Ref Range   Sodium 133 (L) 135 - 145 mmol/L   Potassium 3.9 3.5 - 5.1 mmol/L   Chloride 101 98 - 111 mmol/L   CO2 20 (L) 22 - 32 mmol/L   Glucose, Bld 256 (H) 70 - 99 mg/dL    Comment: Glucose reference range applies only to samples taken after fasting for at least 8 hours.   BUN 43 (H) 6 - 20 mg/dL   Creatinine, Ser 1.09 (H) 0.44 - 1.00 mg/dL   Calcium 8.6 (L) 8.9 - 10.3 mg/dL   GFR, Estimated >60 >60 mL/min    Comment: (NOTE) Calculated using the CKD-EPI Creatinine Equation (2021)    Anion gap 12 5 - 15    Comment: Performed at [facility], [address] [place], [place] [address]  Troponin I (High Sensitivity)     Status: Abnormal   Collection Time: 05/04/21  8:04 PM  Result Value Ref Range   Troponin I (High Sensitivity) 66 (H) <18 ng/L    Comment: (NOTE) Elevated high sensitivity troponin I (hsTnI) values and significant  changes across serial measurements may  suggest ACS but many other  chronic and acute conditions are known to elevate hsTnI results.  Refer to the "Links" section for chest pain algorithms and additional  guidance. Performed at [facility], [address] [place], [place] [address]   CBC     Status: Abnormal   Collection Time: 05/04/21  8:04 PM  Result Value Ref Range   WBC 6.6 4.0 - 10.5 K/uL   RBC 4.55 3.87 - 5.11 MIL/uL   Hemoglobin 11.3 (L) 12.0 - 15.0 g/dL   HCT 36.8 36.0 - 46.0 %   MCV 80.9 80.0 - 100.0 fL   MCH 24.8 (L) 26.0 - 34.0 pg   MCHC 30.7 30.0 - 36.0 g/dL   RDW 16.9 (H) 11.5 - 15.5 %   Platelets 212 150 - 400 K/uL   nRBC 0.0 0.0 - 0.2 %    Comment: Performed at [facility], [address] [place], [place] [address]  CBG monitoring, ED     Status: Abnormal   Collection Time: 05/04/21  9:29 PM  Result Value Ref Range   Glucose-Capillary 230 (H) 70 - 99 mg/dL    Comment: Glucose reference range applies only to samples taken after  fasting for at least 8 hours.  POC urine preg, ED     Status: None   Collection Time: 05/04/21  9:49 PM  Result Value Ref Range   Preg Test, Ur NEGATIVE NEGATIVE    Comment:        THE SENSITIVITY OF THIS METHODOLOGY IS >24 mIU/mL   Troponin I (High Sensitivity)     Status: Abnormal   Collection Time: 05/04/21 10:06 PM  Result Value Ref Range   Troponin I (High Sensitivity) 68 (H) <18 ng/L    Comment: (NOTE) Elevated high sensitivity troponin I (hsTnI) values and significant  changes across serial measurements may suggest ACS but many other  chronic and acute conditions are known to elevate hsTnI results.  Refer to the "Links" section for chest pain algorithms and additional  guidance. Performed at [facility], [address], [place], [place] [address]   Resp Panel by RT-PCR (Flu A&B, Covid) Nasopharyngeal Swab     Status: None   Collection Time: 05/04/21 10:18 PM   Specimen: Nasopharyngeal Swab; Nasopharyngeal(NP) swabs in vial transport medium  Result Value Ref  Range   SARS Coronavirus 2 by RT PCR NEGATIVE NEGATIVE    Comment: (NOTE) SARS-CoV-2 target nucleic acids are NOT DETECTED.  The SARS-CoV-2 RNA is generally detectable in upper respiratory specimens during the acute phase of infection. The lowest concentration of SARS-CoV-2 viral copies this assay can detect is 138 copies/mL. A negative result does not preclude SARS-Cov-2 infection and should not be used as the sole basis for treatment or other patient management decisions. A negative result may occur with  improper specimen collection/handling, submission of specimen other than nasopharyngeal swab, presence of viral mutation(s) within the areas targeted by this assay, and inadequate number of viral copies(<138 copies/mL). A negative result must be combined with clinical observations, patient history, and epidemiological information. The expected result is Negative.  Fact Sheet for Patients:  EntrepreneurPulse.com.au  Fact Sheet for Healthcare Providers:  IncredibleEmployment.be  This test is no t yet approved or cleared by the [place] FDA and  has been authorized for detection and/or diagnosis of SARS-CoV-2 by FDA under an Emergency Use Authorization (EUA). This EUA will remain  in effect (meaning this test can be used) for the duration of the COVID-19 declaration under Section 564(b)(1) of the Act, 21 U.S.C.section 360bbb-3(b)(1), unless the authorization is terminated  or revoked sooner.       Influenza A by PCR NEGATIVE NEGATIVE   Influenza B by PCR NEGATIVE NEGATIVE    Comment: (NOTE) The Xpert Xpress SARS-CoV-2/FLU/RSV plus assay is intended as an aid in the diagnosis of influenza from Nasopharyngeal swab specimens and should not be used as a sole basis for treatment. Nasal washings and aspirates are unacceptable for Xpert Xpress SARS-CoV-2/FLU/RSV testing.  Fact Sheet for  Patients: EntrepreneurPulse.com.au  Fact Sheet for Healthcare Providers: IncredibleEmployment.be  This test is not yet approved or cleared by the [place] FDA and has been authorized for detection and/or diagnosis of SARS-CoV-2 by FDA under an Emergency Use Authorization (EUA). This EUA will remain in effect (meaning this test can be used) for the duration of the COVID-19 declaration under Section 564(b)(1) of the Act, 21 U.S.C. section 360bbb-3(b)(1), unless the authorization is terminated or revoked.  Performed at [facility], [address], [place], [place] [address]   Glucose, capillary     Status: Abnormal   Collection Time: 05/05/21  1:33 AM  Result Value Ref Range   Glucose-Capillary 203 (H) 70 -  99 mg/dL    Comment: Glucose reference range applies only to samples taken after fasting for at least 8 hours.    Chemistries  Recent Labs  Lab 05/04/21 2004  NA 133*  K 3.9  CL 101  CO2 20*  GLUCOSE 256*  BUN 43*  CREATININE 1.09*  CALCIUM 8.6*   ------------------------------------------------------------------------------------------------------------------  ------------------------------------------------------------------------------------------------------------------ GFR: Estimated Creatinine Clearance: 56.3 mL/min (A) (by C-G formula based on SCr of 1.09 mg/dL (H)). Liver Function Tests: No results for input(s): AST, ALT, ALKPHOS, BILITOT, PROT, ALBUMIN in the last 168 hours. No results for input(s): LIPASE, AMYLASE in the last 168 hours. No results for input(s): AMMONIA in the last 168 hours. Coagulation Profile: No results for input(s): INR, PROTIME in the last 168 hours. Cardiac Enzymes: No results for input(s): CKTOTAL, CKMB, CKMBINDEX, TROPONINI in the last 168 hours. BNP (last 3 results) No results for input(s): PROBNP in the last 8760 hours. HbA1C: No results for input(s): HGBA1C in the last 72  hours. CBG: Recent Labs  Lab 05/04/21 2129 05/05/21 0133  GLUCAP 230* 203*   Lipid Profile: No results for input(s): CHOL, HDL, LDLCALC, TRIG, CHOLHDL, LDLDIRECT in the last 72 hours. Thyroid Function Tests: No results for input(s): TSH, T4TOTAL, FREET4, T3FREE, THYROIDAB in the last 72 hours. Anemia Panel: No results for input(s): VITAMINB12, FOLATE, FERRITIN, TIBC, IRON, RETICCTPCT in the last 72 hours.  --------------------------------------------------------------------------------------------------------------- Urine analysis: No results found for: COLORURINE, APPEARANCEUR, LABSPEC, PHURINE, GLUCOSEU, HGBUR, BILIRUBINUR, KETONESUR, PROTEINUR, UROBILINOGEN, NITRITE, LEUKOCYTESUR    Imaging Results:    DG Chest 2 View  Result Date: 05/04/2021 CLINICAL DATA:  Chest tightness radiating up to the neck. Ongoing since last week. EXAM: CHEST - 2 VIEW COMPARISON:  02/13/2020 FINDINGS: The heart size and mediastinal contours are within normal limits. Both lungs are clear. The visualized skeletal structures are unremarkable. IMPRESSION: No active cardiopulmonary disease. Electronically Signed   By: Lucienne Capers M.D.   On: 05/04/2021 19:45    My personal review of EKG: Rhythm NSR, Rate 93 /min, QTc 447 , minimal ST depression in the lateral leads   Assessment & Plan:    Principal Problem:   Chest pain Active Problems:   Depression with anxiety   Diabetes mellitus (HCC)   Essential hypertension   CAD in native artery   GERD (gastroesophageal reflux disease)   Chest pain Consult cardiology Troponins so far flat at 66 and 68, repeat pending EKG with 1 mm of depression in the lateral leads Continue home coronary artery disease medications Continue to monitor CAD Continue aspirin, statin, Coreg, Imdur, lisinopril, Plavix Hypertension Continue hydrochlorothiazide Depression and anxiety Continue Zoloft and trazodone Diabetes mellitus type 2 Sliding scale  coverage GERD Continue PPI    DVT Prophylaxis-   Lovenox - SCDs   AM Labs Ordered, also please review Full Orders  Family Communication: No family at bedside Code Status: Full  Admission status: Observation Time spent in minutes : [place] DO

## 2021-05-05 NOTE — H&P (View-Only) (Signed)
Cardiology Consultation:   Patient ID: JOHNASIA Snow MRN: [id number]; DOB: [birth year]  Admit date: 05/04/2021 Date of Consult: 05/05/2021  PCP:  Wyatt Haste, NP   [facility] Providers Cardiologist:  Carlyle Dolly, MD   {  Patient Profile:   Amy Snow is a 55 y.o. female with a past medical history of CAD (s/p STEMI in 2016 with DES x2 to PDA, cath in 05/2019 with DES to LAD), HFimpEF (EF 25% by cath in 06/2015 and normalized by repeat imaging), HTN, HLD, Type 2 DM and Fibromyalgia who is being seen 05/05/2021 for the evaluation of chest pain and elevated troponin values at the request of Dr. Clearence Ped.  History of Present Illness:   Amy Snow called the office on 05/04/2021 reporting intermittent chest pain over the past week which was responsive to SL NTG and she was advised to go to the ED for further evaluation. In talking with the patient today, she reports that starting last Tuesday she started to experience episodes of chest pressure which would radiate into her neck. Symptoms would typically occur with activity and resolve with rest. 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 She has taken SL NTG with improvement in her symptoms. Reports associated nausea and diaphoresis over the past week but thought it might have been due to an illness she was exposed to at her job. She denies any orthopnea, PND or pitting edema. She had recurrent pain yesterday afternoon which again resolved with SL NTG. Denies any recurrent symptoms since admission. Reports compliance with her medications except for Atorvastatin as this had not been refilled by her pharmacy.   Initial labs show WBC 6.6, Hgb 11.3, platelets 212, Na+ 133, K+ 3.9 and creatinine 1.09 (baseline 0.8 - 0.9). COVID negative. FLP shows total cholesterol of 322, triglycerides 851, HDL 29 and direct LDL pending. Initial Hs Troponin 66 with repeat values of 68 and 68. CXR with no active  cardiopulmonary disease. EKG shows NSR, HR 93 with ST depression along the lateral leads which is new when compared to prior tracings.   EDP spoke with the on-call overnight Cardiology provider who recommended transfer to [facility] in anticipation of possible intervention but she was admitted to [facility] instead.     Past Medical History:  Diagnosis Date   Anxiety and depression    CAD (coronary artery disease)    a. s/p STEMI 2016 s/p DES x 2 PDA with residual dz treated medically. b. [place] in 2020 with cath s/p DES to long high grade lesion in LAD, residual disease treated medically, EF 50%.   Diabetes mellitus, type II ([place])    Family history of adverse reaction to anesthesia    PONV   Fibromyalgia    GERD (gastroesophageal reflux disease)    Hyperlipidemia    Hypertension    Ischemic cardiomyopathy    a. EF <25% in 2016, improved to normal thereafter. b. EF 50% in 05/2019.   Myocardial infarct [facility])    august 2016    Past Surgical History:  Procedure Laterality Date   BIOPSY  08/15/2020   Procedure: BIOPSY;  Surgeon: Harvel Quale, MD;  [facility];  Service: Gastroenterology;;   CARDIAC CATHETERIZATION N/A 07/05/2015   Procedure: Left Heart Cath and Coronary Angiography;  Surgeon: Lorretta Harp, MD;  Location: [place] CV LAB;  Service: Cardiovascular;  Laterality: N/A;   CESAREAN SECTION     COLONOSCOPY WITH  PROPOFOL N/A 08/15/2020   Procedure: COLONOSCOPY WITH PROPOFOL;  Surgeon: Harvel Quale, MD;  Location: [facility];  Service: Gastroenterology;  Laterality: N/A;  1230   CORONARY STENT INTERVENTION N/A 05/24/2019   Procedure: CORONARY STENT INTERVENTION;  Surgeon: Lorretta Harp, MD;  Location: [place] CV LAB;  Service: Cardiovascular;  Laterality: N/A;   ESOPHAGEAL DILATION N/A 08/15/2020   Procedure: ESOPHAGEAL DILATION;  Surgeon: Harvel Quale, MD;  [facility];  Service: Gastroenterology;   Laterality: N/A;   ESOPHAGOGASTRODUODENOSCOPY (EGD) WITH PROPOFOL N/A 08/15/2020   Procedure: ESOPHAGOGASTRODUODENOSCOPY (EGD) WITH PROPOFOL;  Surgeon: Harvel Quale, MD;  [facility];  Service: Gastroenterology;  Laterality: N/A;   HERNIA REPAIR Left    x2 ventral and inguinal   LEFT HEART CATH AND CORONARY ANGIOGRAPHY N/A 05/24/2019   Procedure: LEFT HEART CATH AND CORONARY ANGIOGRAPHY;  Surgeon: Lorretta Harp, MD;  Location: [place] CV LAB;  Service: Cardiovascular;  Laterality: N/A;   POLYPECTOMY  08/15/2020   Procedure: POLYPECTOMY;  Surgeon: Harvel Quale, MD;  [facility];  Service: Gastroenterology;;   TONSILLECTOMY     TUBAL LIGATION       Home Medications:  Prior to Admission medications   Medication Sig Start Date End Date Taking? Authorizing Provider  acetaminophen (TYLENOL) 650 MG CR tablet Take 650-1,300 mg by mouth every 8 (eight) hours as needed for pain.    Yes [provider]  amitriptyline (ELAVIL) 25 MG tablet Take 25 mg by mouth at bedtime. 04/28/21  Yes [provider]  APPLE CIDER VINEGAR PO Take 900 mg by mouth in the morning and at bedtime. 450 mg/tablet   Yes [provider]  aspirin 81 MG chewable tablet Chew 1 tablet (81 mg total) by mouth daily. 05/15/19  Yes Barrett, Evelene Croon, PA-C  atorvastatin (LIPITOR) 80 MG tablet TAKE 1 TABLET BY MOUTH ONCE DAILY AT  6PM 02/04/21  Yes Verta Ellen., NP  carvedilol (COREG) 25 MG tablet Take 25 mg by mouth 2 (two) times daily. 06/17/20  Yes [provider]  clopidogrel (PLAVIX) 75 MG tablet Take 1 tablet (75 mg total) by mouth daily. 03/28/20  Yes Branch, Alphonse Guild, MD  hydrochlorothiazide (MICROZIDE) 12.5 MG capsule TAKE 1 CAPSULE BY MOUTH ONCE DAILY-TAKE AN EXTRA CAPSULE DAILY AS NEEDED FOR WEIGHT GAIN OF 3 LBS IN ONE DAY OR 5 LBS IN ONE WEEK 11/25/20  Yes Verta Ellen., NP  insulin NPH-regular Human (70-30) 100 UNIT/ML injection  Inject 80 Units into the skin 2 (two) times a day.    Yes [provider]  isosorbide mononitrate (IMDUR) 30 MG 24 hr tablet Take 1 tablet (30 mg total) by mouth daily. 02/18/20 08/12/21 Yes Verta Ellen., NP  lisinopril (ZESTRIL) 20 MG tablet Take 1 tablet (20 mg total) by mouth daily. 02/18/20 08/12/21 Yes Verta Ellen., NP  omeprazole (PRILOSEC) 40 MG capsule Take 1 capsule (40 mg total) by mouth daily. 08/11/20  Yes Harvel Quale, MD  sertraline (ZOLOFT) 100 MG tablet Take 150 mg by mouth at bedtime.  01/09/20  Yes [provider]  traZODone (DESYREL) 50 MG tablet Take 50 mg by mouth at bedtime.   Yes [provider]  carvedilol (COREG) 12.5 MG tablet Take 1 tablet (12.5 mg total) by mouth 2 (two) times daily. Patient not taking: Reported on 08/12/2020 06/09/20 09/07/20  Verta Ellen., NP  nitroGLYCERIN (NITROSTAT) 0.4 MG  SL tablet Place 1 tablet (0.4 mg total) under the tongue every 5 (five) minutes x 3 doses as needed for chest pain (if no relief after 3rd dose, proceed to the ED for an evaluation or call 911). 05/15/19   Barrett, Evelene Croon, PA-C  topiramate (TOPAMAX) 25 MG tablet Take 25 mg by mouth in the morning and at bedtime. Patient not taking: Reported on 05/05/2021 07/16/20   [provider]    Inpatient Medications: Scheduled Meds:  aspirin  81 mg Oral Daily   atorvastatin  80 mg Oral Daily   carvedilol  25 mg Oral BID   clopidogrel  75 mg Oral Daily   insulin aspart  0-15 Units Subcutaneous TID WC   insulin aspart  0-5 Units Subcutaneous QHS   isosorbide mononitrate  30 mg Oral Daily   lisinopril  20 mg Oral Daily   mouth rinse  15 mL Mouth Rinse BID   pantoprazole  40 mg Oral Daily   sertraline  150 mg Oral QHS   traZODone  50 mg Oral QHS   Continuous Infusions:  sodium chloride     Followed by   sodium chloride     PRN Meds: acetaminophen, nitroGLYCERIN, ondansetron (ZOFRAN) IV  Allergies:   No Known  Allergies  Social History:   Social History   Socioeconomic History   Marital status: Divorced    Spouse name: Not on file   Number of children: 2   Years of education: Not on file   Highest education level: Not on file  Occupational History   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  She has two children 19 & 26 as well as three grandchildren.  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     Social Determinants of Health   Financial Resource Strain: Not on file  Food Insecurity: Not on file  Transportation Needs: Not on file  Physical Activity: Not on file  Stress: Not on file  Social Connections: Not on file  Intimate Partner Violence: Not on file    Family History:    Family History  Problem Relation Age of Onset   Alcohol abuse Mother    Anxiety disorder Father    Depression Father    Alcohol abuse Father    Anxiety disorder Sister    Depression Sister    Depression Maternal Aunt    Depression Cousin      ROS:  Please see the history of present illness.   All other ROS reviewed and negative.     Physical Exam/Data:   Vitals:   05/04/21 2130 05/04/21 2200 05/05/21 0107 05/05/21 0537  BP: (!) 156/92 (!) 159/77 (!) 161/84 (!) 103/44  Pulse: 86 79 79 73  Resp: 19 19 20 20   Temp:   97.7 F (36.5 C) 98 F (36.7 C)  TempSrc:   Oral Oral  SpO2: 95% 96% 97% 94%  Weight:   84.5 kg   Height:   4\' 11"  (1.499 m)    No intake or output data in the 24 hours ending 05/05/21 0849 Last 3 Weights 05/05/2021 05/04/2021 08/15/2020  Weight (lbs) 186  lb 4.6 oz 185 lb 190 lb  Weight (kg) 84.5 kg 83.915 kg 86.183 kg  Some encounter information is confidential and restricted.  Go to Review Flowsheets activity to see all data.     Body mass index is 37.63 kg/m.  General:  Well nourished, well developed female appearing in no acute distress HEENT: normal Lymph: no adenopathy Neck: no JVD Endocrine:  No thryomegaly Vascular: No carotid bruits; FA pulses 2+ bilaterally without bruits  Cardiac:  normal S1, S2; RRR; no murmur. Lungs:  clear to auscultation bilaterally, no wheezing, rhonchi or rales  Abd: soft, nontender, no hepatomegaly  Ext: no edema Musculoskeletal:  No deformities, BUE and BLE strength normal and equal Skin: warm and dry  Neuro:  CNs 2-12 intact, no focal abnormalities noted Psych:  Normal affect   EKG:  The EKG was personally reviewed and demonstrates: NSR, HR 93 with ST depression along the lateral leads which is new when compared to prior tracings.   Telemetry:  Telemetry was personally reviewed and demonstrates:  NSR, HR in 70's to 80's with occasional PVC's.   Relevant CV Studies:   Cardiac Catheterization: 05/2019 Prox LAD to Mid LAD lesion is 95% stenosed. Mid LAD lesion is 90% stenosed. Prox Cx lesion is 95% stenosed. Prox Cx to Mid Cx lesion is 90% stenosed. Prox RCA lesion is 30% stenosed. 2nd RPL lesion is 80% stenosed. RPDA-2 lesion is 80% stenosed. Previously placed RPDA-1 stent (unknown type) is widely patent. A drug-eluting stent was successfully placed using a STENT SYNERGY DES 2.25X28. Post intervention, there is a 0% residual stenosis. Post intervention, there is a 0% residual stenosis. There is mild left ventricular systolic dysfunction. LV end diastolic pressure is normal. The left ventricular ejection fraction is 50-55% by visual estimate.  IMPRESSION: Successful approximately PCI and drug-eluting stenting of a long high-grade lesion probably responsible for her accelerated symptoms over the last several months.  The circumflex was a relatively small vessel with long segmental disease.  The right was a large  vessel with a patent PDA stent and some moderate disease in the mid PDA and PLA.  LV function was preserved although there was mild inferoapical hypokinesia with a visual EF estimated at approximate 50%.  Patient will need uninterrupted dual antiplatelet platelet therapy for 1 year along with risk factor modification.  She will be gently hydrated overnight and discharged home in the morning.   Echocardiogram: 05/2019 IMPRESSIONS     1. The left ventricle has normal systolic function, with an ejection  fraction of 55-60%. The cavity size was normal. Left ventricular diastolic  parameters were normal.   2. The right ventricle has normal systolic function. The cavity was  normal. There is no increase in right ventricular wall thickness. Right  ventricular systolic pressure is normal with an estimated pressure of 10.7  mmHg.   3. The mitral valve is grossly normal.   4. The tricuspid valve is grossly normal.   5. The aortic valve is tricuspid.   6. The aorta is normal in size and structure.   7. The inferior vena cava was normal in size with <50% respiratory  variability.   Laboratory Data:  High Sensitivity Troponin:   Recent Labs  Lab 05/04/21 2004 05/04/21 2206 05/05/21 0315  TROPONINIHS 66* 68* 68*     Chemistry Recent Labs  Lab 05/04/21 2004  NA 133*  K 3.9  CL 101  CO2 20*  GLUCOSE 256*  BUN 43*  CREATININE 1.09*  CALCIUM 8.6*  GFRNONAA >60  ANIONGAP 12    No results for input(s): PROT, ALBUMIN, AST, ALT, ALKPHOS, BILITOT in the last 168 hours. Hematology Recent Labs  Lab 05/04/21 2004  WBC 6.6  RBC 4.55  HGB 11.3*  HCT 36.8  MCV 80.9  MCH 24.8*  MCHC 30.7  RDW 16.9*  PLT 212   BNPNo results for input(s): BNP, PROBNP in the last 168 hours.  DDimer No results for input(s): DDIMER in the last 168 hours.   Radiology/Studies:  DG Chest 2 View  Result Date: 05/04/2021 CLINICAL DATA:  Chest tightness radiating up to the neck. Ongoing since last week.  EXAM: CHEST - 2 VIEW COMPARISON:  02/13/2020 FINDINGS: The heart size and mediastinal contours are within normal limits. Both lungs are clear. The visualized skeletal structures are unremarkable. IMPRESSION: No active cardiopulmonary disease. Electronically Signed   By: Lucienne Capers M.D.   On: 05/04/2021 19:45     Assessment and Plan:   1. Chest Pain concerning for Accelerating Angina - She presents with a 1-week history of progressive chest pain which has occurred with activity and radiates into her neck. Has improved with SL NTG.  - Initial Hs Troponin 66 with repeat values of 68 and 68. EKG shows NSR, HR 93 with ST depression along the lateral leads which is new when compared to prior tracings.  - Reviewed with Dr. Paticia Stack and will plan for a cardiac catheterization for definitive evaluation. The patient is in agreement as well. The patient understands that risks include but are not limited to stroke (1 in 1000), death (1 in 23), kidney failure [usually temporary] (1 in 500), bleeding (1 in 200), allergic reaction [possibly serious] (1 in 200).    - Will add to the cath-board for later today. Will start Heparin given her enzyme elevation. Continue ASA, Plavix, Atorvastatin, Coreg and Imdur.   2. CAD - She is s/p STEMI in 2016 with DES x2 to PDA and cath in 05/2019 with DES to LAD. She did have residual disease as outlined above.  - Will plan for a repeat catheterization as outlined above. Continue ASA, Plavix, Coreg, Imdur and Atorvastatin.   3. HFimpEF - Her EF was previously 25% by cath in 06/2015 and normalized by repeat imaging. No reported orthopnea, PND or pitting edema and she appears euvolemic by examination today.  - Continue Coreg 25mg  BID and Lisinopril 20mg  daily.  4. HTN - BP has been variable from 103/44 - 161/84 since admission.  - Continue Coreg, Lisinopril and Imdur. Will hold HCTZ in anticipation of her catheterization.   5. HLD - FLP shows total cholesterol of 322,  triglycerides 851, HDL 29 and direct LDL pending. She has been without her Atorvastatin for a month and has been restarted on Atorvastatin 80mg  daily. Would plan for repeat FLP and LFT's in 2 months. If LDL still above 70, would recommend referral to the Lipid Clinic.   6. Type 2 DM - Hgb A1c pending. SSI already ordered by the Hospitalist.     Risk Assessment/Risk Scores:     TIMI Risk Score for Unstable Angina or Non-ST Elevation MI:   The patient's TIMI risk score is 6, which indicates a 41% risk of all cause mortality, new or recurrent myocardial infarction or need for urgent revascularization in the next 14 days.    For questions or updates, please contact [place] Please consult [email or web address] for contact info under    Signed, Erma Heritage, PA-C  05/05/2021 8:49 AM

## 2021-05-05 NOTE — Interval H&P Note (Signed)
Cath Lab Visit (complete for each Cath Lab visit)  Clinical Evaluation Leading to the Procedure:   ACS: No.  Non-ACS:    Anginal Classification: CCS III  Anti-ischemic medical therapy: Maximal Therapy (2 or more classes of medications)  Non-Invasive Test Results: No non-invasive testing performed  Prior CABG: No previous CABG      History and Physical Interval Note:  05/05/2021 4:30 PM  Amy Snow  has presented today for surgery, with the diagnosis of angina.  The various methods of treatment have been discussed with the patient and family. After consideration of risks, benefits and other options for treatment, the patient has consented to  Procedure(s): LEFT HEART CATH AND CORONARY ANGIOGRAPHY (N/A) as a surgical intervention.  The patient's history has been reviewed, patient examined, no change in status, stable for surgery.  I have reviewed the patient's chart and labs.  Questions were answered to the patient's satisfaction.     Shelva Majestic

## 2021-05-05 NOTE — Progress Notes (Signed)
Pt transported to [facility] via [facility] in stable condition. Denies chest pain at this time. Report given to [facility].

## 2021-05-05 NOTE — Progress Notes (Signed)
Site area: Right groin a 5 french arterial sheath was removed by Karleen Dolphin RN from [facility]  Site Prior to Removal:  Level 0  Pressure Applied For 20 MINUTES    Bedrest 1810p X 4 hours  Manual:   Yes.    Patient Status During Pull:  stable  Post Pull Groin Site:  Level 0  Post Pull Instructions Given:  Yes.    Post Pull Pulses Present:  Yes.    Dressing Applied:  Yes.    Comments:

## 2021-05-05 NOTE — Consult Note (Signed)
Cardiology Consultation:   Patient ID: ZOII FLORER MRN: [id number]; DOB: February 22, 1966  Admit date: 05/04/2021 Date of Consult: 05/05/2021  PCP:  Wyatt Haste, NP   [facility] Providers Cardiologist:  Carlyle Dolly, MD   {  Patient Profile:   KIMBERELY MCCANNON is a 55 y.o. female with a past medical history of CAD (s/p STEMI in 2016 with DES x2 to PDA, cath in 05/2019 with DES to LAD), HFimpEF (EF 25% by cath in 06/2015 and normalized by repeat imaging), HTN, HLD, Type 2 DM and Fibromyalgia who is being seen 05/05/2021 for the evaluation of chest pain and elevated troponin values at the request of Dr. Clearence Ped.  History of Present Illness:   Ms. Flansburg called the office on 05/04/2021 reporting intermittent chest pain over the past week which was responsive to SL NTG and she was advised to go to the ED for further evaluation. In talking with the patient today, she reports that starting last Tuesday she started to experience episodes of chest pressure which would radiate into her neck. Symptoms would typically occur with activity and resolve with rest. 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 She has taken SL NTG with improvement in her symptoms. Reports associated nausea and diaphoresis over the past week but thought it might have been due to an illness she was exposed to at her job. She denies any orthopnea, PND or pitting edema. She had recurrent pain yesterday afternoon which again resolved with SL NTG. Denies any recurrent symptoms since admission. Reports compliance with her medications except for Atorvastatin as this had not been refilled by her pharmacy.   Initial labs show WBC 6.6, Hgb 11.3, platelets 212, Na+ 133, K+ 3.9 and creatinine 1.09 (baseline 0.8 - 0.9). COVID negative. FLP shows total cholesterol of 322, triglycerides 851, HDL 29 and direct LDL pending. Initial Hs Troponin 66 with repeat values of 68 and 68. CXR with no active  cardiopulmonary disease. EKG shows NSR, HR 93 with ST depression along the lateral leads which is new when compared to prior tracings.   EDP spoke with the on-call overnight Cardiology provider who recommended transfer to [facility] in anticipation of possible intervention but she was admitted to [facility] instead.     Past Medical History:  Diagnosis Date   Anxiety and depression    CAD (coronary artery disease)    a. s/p STEMI 2016 s/p DES x 2 PDA with residual dz treated medically. b. [place] in 2020 with cath s/p DES to long high grade lesion in LAD, residual disease treated medically, EF 50%.   Diabetes mellitus, type II ([place])    Family history of adverse reaction to anesthesia    PONV   Fibromyalgia    GERD (gastroesophageal reflux disease)    Hyperlipidemia    Hypertension    Ischemic cardiomyopathy    a. EF <25% in 2016, improved to normal thereafter. b. EF 50% in 05/2019.   Myocardial infarct [facility])    august 2016    Past Surgical History:  Procedure Laterality Date   BIOPSY  08/15/2020   Procedure: BIOPSY;  Surgeon: Harvel Quale, MD;  [facility];  Service: Gastroenterology;;   CARDIAC CATHETERIZATION N/A 07/05/2015   Procedure: Left Heart Cath and Coronary Angiography;  Surgeon: Lorretta Harp, MD;  Location: [place] CV LAB;  Service: Cardiovascular;  Laterality: N/A;   CESAREAN SECTION     COLONOSCOPY WITH  PROPOFOL N/A 08/15/2020   Procedure: COLONOSCOPY WITH PROPOFOL;  Surgeon: Harvel Quale, MD;  [facility];  Service: Gastroenterology;  Laterality: N/A;  1230   CORONARY STENT INTERVENTION N/A 05/24/2019   Procedure: CORONARY STENT INTERVENTION;  Surgeon: Lorretta Harp, MD;  Location: [place] CV LAB;  Service: Cardiovascular;  Laterality: N/A;   ESOPHAGEAL DILATION N/A 08/15/2020   Procedure: ESOPHAGEAL DILATION;  Surgeon: Harvel Quale, MD;  [facility];  Service: Gastroenterology;   Laterality: N/A;   ESOPHAGOGASTRODUODENOSCOPY (EGD) WITH PROPOFOL N/A 08/15/2020   Procedure: ESOPHAGOGASTRODUODENOSCOPY (EGD) WITH PROPOFOL;  Surgeon: Harvel Quale, MD;  [facility];  Service: Gastroenterology;  Laterality: N/A;   HERNIA REPAIR Left    x2 ventral and inguinal   LEFT HEART CATH AND CORONARY ANGIOGRAPHY N/A 05/24/2019   Procedure: LEFT HEART CATH AND CORONARY ANGIOGRAPHY;  Surgeon: Lorretta Harp, MD;  Location: [place] CV LAB;  Service: Cardiovascular;  Laterality: N/A;   POLYPECTOMY  08/15/2020   Procedure: POLYPECTOMY;  Surgeon: Harvel Quale, MD;  [facility];  Service: Gastroenterology;;   TONSILLECTOMY     TUBAL LIGATION       Home Medications:  Prior to Admission medications   Medication Sig Start Date End Date Taking? Authorizing Provider  acetaminophen (TYLENOL) 650 MG CR tablet Take 650-1,300 mg by mouth every 8 (eight) hours as needed for pain.    Yes [provider]  amitriptyline (ELAVIL) 25 MG tablet Take 25 mg by mouth at bedtime. 04/28/21  Yes [provider]  APPLE CIDER VINEGAR PO Take 900 mg by mouth in the morning and at bedtime. 450 mg/tablet   Yes [provider]  aspirin 81 MG chewable tablet Chew 1 tablet (81 mg total) by mouth daily. 05/15/19  Yes Barrett, Evelene Croon, PA-C  atorvastatin (LIPITOR) 80 MG tablet TAKE 1 TABLET BY MOUTH ONCE DAILY AT  6PM 02/04/21  Yes Verta Ellen., NP  carvedilol (COREG) 25 MG tablet Take 25 mg by mouth 2 (two) times daily. 06/17/20  Yes [provider]  clopidogrel (PLAVIX) 75 MG tablet Take 1 tablet (75 mg total) by mouth daily. 03/28/20  Yes Branch, Alphonse Guild, MD  hydrochlorothiazide (MICROZIDE) 12.5 MG capsule TAKE 1 CAPSULE BY MOUTH ONCE DAILY-TAKE AN EXTRA CAPSULE DAILY AS NEEDED FOR WEIGHT GAIN OF 3 LBS IN ONE DAY OR 5 LBS IN ONE WEEK 11/25/20  Yes Verta Ellen., NP  insulin NPH-regular Human (70-30) 100 UNIT/ML injection  Inject 80 Units into the skin 2 (two) times a day.    Yes [provider]  isosorbide mononitrate (IMDUR) 30 MG 24 hr tablet Take 1 tablet (30 mg total) by mouth daily. 02/18/20 08/12/21 Yes Verta Ellen., NP  lisinopril (ZESTRIL) 20 MG tablet Take 1 tablet (20 mg total) by mouth daily. 02/18/20 08/12/21 Yes Verta Ellen., NP  omeprazole (PRILOSEC) 40 MG capsule Take 1 capsule (40 mg total) by mouth daily. 08/11/20  Yes Harvel Quale, MD  sertraline (ZOLOFT) 100 MG tablet Take 150 mg by mouth at bedtime.  01/09/20  Yes [provider]  traZODone (DESYREL) 50 MG tablet Take 50 mg by mouth at bedtime.   Yes [provider]  carvedilol (COREG) 12.5 MG tablet Take 1 tablet (12.5 mg total) by mouth 2 (two) times daily. Patient not taking: Reported on 08/12/2020 06/09/20 09/07/20  Verta Ellen., NP  nitroGLYCERIN (NITROSTAT) 0.4 MG  SL tablet Place 1 tablet (0.4 mg total) under the tongue every 5 (five) minutes x 3 doses as needed for chest pain (if no relief after 3rd dose, proceed to the ED for an evaluation or call 911). 05/15/19   Barrett, Evelene Croon, PA-C  topiramate (TOPAMAX) 25 MG tablet Take 25 mg by mouth in the morning and at bedtime. Patient not taking: Reported on 05/05/2021 07/16/20   [provider]    Inpatient Medications: Scheduled Meds:  aspirin  81 mg Oral Daily   atorvastatin  80 mg Oral Daily   carvedilol  25 mg Oral BID   clopidogrel  75 mg Oral Daily   insulin aspart  0-15 Units Subcutaneous TID WC   insulin aspart  0-5 Units Subcutaneous QHS   isosorbide mononitrate  30 mg Oral Daily   lisinopril  20 mg Oral Daily   mouth rinse  15 mL Mouth Rinse BID   pantoprazole  40 mg Oral Daily   sertraline  150 mg Oral QHS   traZODone  50 mg Oral QHS   Continuous Infusions:  sodium chloride     Followed by   sodium chloride     PRN Meds: acetaminophen, nitroGLYCERIN, ondansetron (ZOFRAN) IV  Allergies:   No Known  Allergies  Social History:   Social History   Socioeconomic History   Marital status: Divorced    Spouse name: Not on file   Number of children: 2   Years of education: Not on file   Highest education level: Not on file  Occupational History   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  She has two children 19 & 26 as well as three grandchildren.  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     Social Determinants of Health   Financial Resource Strain: Not on file  Food Insecurity: Not on file  Transportation Needs: Not on file  Physical Activity: Not on file  Stress: Not on file  Social Connections: Not on file  Intimate Partner Violence: Not on file    Family History:    Family History  Problem Relation Age of Onset   Alcohol abuse Mother    Anxiety disorder Father    Depression Father    Alcohol abuse Father    Anxiety disorder Sister    Depression Sister    Depression Maternal Aunt    Depression Cousin      ROS:  Please see the history of present illness.   All other ROS reviewed and negative.     Physical Exam/Data:   Vitals:   05/04/21 2130 05/04/21 2200 05/05/21 0107 05/05/21 0537  BP: (!) 156/92 (!) 159/77 (!) 161/84 (!) 103/44  Pulse: 86 79 79 73  Resp: 19 19 20 20   Temp:   97.7 F (36.5 C) 98 F (36.7 C)  TempSrc:   Oral Oral  SpO2: 95% 96% 97% 94%  Weight:   84.5 kg   Height:   4\' 11"  (1.499 m)    No intake or output data in the 24 hours ending 05/05/21 0849 Last 3 Weights 05/05/2021 05/04/2021 08/15/2020  Weight (lbs) 186  lb 4.6 oz 185 lb 190 lb  Weight (kg) 84.5 kg 83.915 kg 86.183 kg  Some encounter information is confidential and restricted.  Go to Review Flowsheets activity to see all data.     Body mass index is 37.63 kg/m.  General:  Well nourished, well developed female appearing in no acute distress HEENT: normal Lymph: no adenopathy Neck: no JVD Endocrine:  No thryomegaly Vascular: No carotid bruits; FA pulses 2+ bilaterally without bruits  Cardiac:  normal S1, S2; RRR; no murmur. Lungs:  clear to auscultation bilaterally, no wheezing, rhonchi or rales  Abd: soft, nontender, no hepatomegaly  Ext: no edema Musculoskeletal:  No deformities, BUE and BLE strength normal and equal Skin: warm and dry  Neuro:  CNs 2-12 intact, no focal abnormalities noted Psych:  Normal affect   EKG:  The EKG was personally reviewed and demonstrates: NSR, HR 93 with ST depression along the lateral leads which is new when compared to prior tracings.   Telemetry:  Telemetry was personally reviewed and demonstrates:  NSR, HR in 70's to 80's with occasional PVC's.   Relevant CV Studies:   Cardiac Catheterization: 05/2019 Prox LAD to Mid LAD lesion is 95% stenosed. Mid LAD lesion is 90% stenosed. Prox Cx lesion is 95% stenosed. Prox Cx to Mid Cx lesion is 90% stenosed. Prox RCA lesion is 30% stenosed. 2nd RPL lesion is 80% stenosed. RPDA-2 lesion is 80% stenosed. Previously placed RPDA-1 stent (unknown type) is widely patent. A drug-eluting stent was successfully placed using a STENT SYNERGY DES 2.25X28. Post intervention, there is a 0% residual stenosis. Post intervention, there is a 0% residual stenosis. There is mild left ventricular systolic dysfunction. LV end diastolic pressure is normal. The left ventricular ejection fraction is 50-55% by visual estimate.  IMPRESSION: Successful approximately PCI and drug-eluting stenting of a long high-grade lesion probably responsible for her accelerated symptoms over the last several months.  The circumflex was a relatively small vessel with long segmental disease.  The right was a large  vessel with a patent PDA stent and some moderate disease in the mid PDA and PLA.  LV function was preserved although there was mild inferoapical hypokinesia with a visual EF estimated at approximate 50%.  Patient will need uninterrupted dual antiplatelet platelet therapy for 1 year along with risk factor modification.  She will be gently hydrated overnight and discharged home in the morning.   Echocardiogram: 05/2019 IMPRESSIONS     1. The left ventricle has normal systolic function, with an ejection  fraction of 55-60%. The cavity size was normal. Left ventricular diastolic  parameters were normal.   2. The right ventricle has normal systolic function. The cavity was  normal. There is no increase in right ventricular wall thickness. Right  ventricular systolic pressure is normal with an estimated pressure of 10.7  mmHg.   3. The mitral valve is grossly normal.   4. The tricuspid valve is grossly normal.   5. The aortic valve is tricuspid.   6. The aorta is normal in size and structure.   7. The inferior vena cava was normal in size with <50% respiratory  variability.   Laboratory Data:  High Sensitivity Troponin:   Recent Labs  Lab 05/04/21 2004 05/04/21 2206 05/05/21 0315  TROPONINIHS 66* 68* 68*     Chemistry Recent Labs  Lab 05/04/21 2004  NA 133*  K 3.9  CL 101  CO2 20*  GLUCOSE 256*  BUN 43*  CREATININE 1.09*  CALCIUM 8.6*  GFRNONAA >60  ANIONGAP 12    No results for input(s): PROT, ALBUMIN, AST, ALT, ALKPHOS, BILITOT in the last 168 hours. Hematology Recent Labs  Lab 05/04/21 2004  WBC 6.6  RBC 4.55  HGB 11.3*  HCT 36.8  MCV 80.9  MCH 24.8*  MCHC 30.7  RDW 16.9*  PLT 212   BNPNo results for input(s): BNP, PROBNP in the last 168 hours.  DDimer No results for input(s): DDIMER in the last 168 hours.   Radiology/Studies:  DG Chest 2 View  Result Date: 05/04/2021 CLINICAL DATA:  Chest tightness radiating up to the neck. Ongoing since last week.  EXAM: CHEST - 2 VIEW COMPARISON:  02/13/2020 FINDINGS: The heart size and mediastinal contours are within normal limits. Both lungs are clear. The visualized skeletal structures are unremarkable. IMPRESSION: No active cardiopulmonary disease. Electronically Signed   By: Lucienne Capers M.D.   On: 05/04/2021 19:45     Assessment and Plan:   1. Chest Pain concerning for Accelerating Angina - She presents with a 1-week history of progressive chest pain which has occurred with activity and radiates into her neck. Has improved with SL NTG.  - Initial Hs Troponin 66 with repeat values of 68 and 68. EKG shows NSR, HR 93 with ST depression along the lateral leads which is new when compared to prior tracings.  - Reviewed with Dr. Paticia Stack and will plan for a cardiac catheterization for definitive evaluation. The patient is in agreement as well. The patient understands that risks include but are not limited to stroke (1 in 1000), death (1 in 17), kidney failure [usually temporary] (1 in 500), bleeding (1 in 200), allergic reaction [possibly serious] (1 in 200).    - Will add to the cath-board for later today. Will start Heparin given her enzyme elevation. Continue ASA, Plavix, Atorvastatin, Coreg and Imdur.   2. CAD - She is s/p STEMI in 2016 with DES x2 to PDA and cath in 05/2019 with DES to LAD. She did have residual disease as outlined above.  - Will plan for a repeat catheterization as outlined above. Continue ASA, Plavix, Coreg, Imdur and Atorvastatin.   3. HFimpEF - Her EF was previously 25% by cath in 06/2015 and normalized by repeat imaging. No reported orthopnea, PND or pitting edema and she appears euvolemic by examination today.  - Continue Coreg 25mg  BID and Lisinopril 20mg  daily.  4. HTN - BP has been variable from 103/44 - 161/84 since admission.  - Continue Coreg, Lisinopril and Imdur. Will hold HCTZ in anticipation of her catheterization.   5. HLD - FLP shows total cholesterol of 322,  triglycerides 851, HDL 29 and direct LDL pending. She has been without her Atorvastatin for a month and has been restarted on Atorvastatin 80mg  daily. Would plan for repeat FLP and LFT's in 2 months. If LDL still above 70, would recommend referral to the Lipid Clinic.   6. Type 2 DM - Hgb A1c pending. SSI already ordered by the Hospitalist.     Risk Assessment/Risk Scores:     TIMI Risk Score for Unstable Angina or Non-ST Elevation MI:   The patient's TIMI risk score is 6, which indicates a 41% risk of all cause mortality, new or recurrent myocardial infarction or need for urgent revascularization in the next 14 days.    For questions or updates, please contact [place] Please consult [email or web address] for contact info under    Signed, Erma Heritage, PA-C  05/05/2021 8:49 AM

## 2021-05-05 NOTE — Progress Notes (Signed)
Received  from [facility] via [place]. Patient alert and oriented X4, skin warm and dry, resp even and unlabored.  Pt denies any chest pain or discomfort at this time. NS and Hep drip infusing in left arm.  Pt placed on monitor, consent signed and BS re-checked.  Pt to procedure area for heart cath via stretcher.

## 2021-05-05 NOTE — Progress Notes (Signed)
Inpatient Diabetes Program Recommendations  AACE/ADA: New Consensus Statement on Inpatient Glycemic Control (2015)  Target Ranges:  Prepandial:   less than 140 mg/dL      Peak postprandial:   less than 180 mg/dL (1-2 hours)      Critically ill patients:  140 - 180 mg/dL   Lab Results  Component Value Date   GLUCAP 264 (H) 05/05/2021   HGBA1C 11.9 (H) 07/06/2015    Review of Glycemic Control Results for Amy Snow, Amy Snow (MRN [id number]) as of 05/05/2021 11:17  Ref. Range 05/04/2021 21:29 05/05/2021 01:33 05/05/2021 07:35 05/05/2021 11:12  Glucose-Capillary Latest Ref Range: 70 - 99 mg/dL 230 (H) 203 (H) 231 (H) 264 (H)   Diabetes history: DM 2 Outpatient Diabetes medications: Novolin 70/30 80 units bid Current orders for Inpatient glycemic control:  Novolog moderate tid with meals and HS Inpatient Diabetes Program Recommendations:   Please add Levemir 15 units bid.  A1C pending.   Thanks,  Adah Perl, RN, BC-ADM Inpatient Diabetes Coordinator Pager [phone]  (8a-5p)

## 2021-05-05 NOTE — Progress Notes (Signed)
Patient seen and examined. Admitted after midnight secondary to CP. Patient with risk factors including: Prior history of coronary disease (status post STEMI in 2016 drug-eluting stent to PDA and cath in July 2020 with DES to LAD), type 2 diabetes, HTN and HLD; heart score is 5-6. Patient reported on going intermittent pain for the last 6-8 weeks, but more intense and radiating into her neck in the last week. Pain is happening  on exertion and has noticed some discomfort even when resting now. Troponin marginally elevated and new ST depressions in her lateral leads on EKG. Hemodynamically stable. Case discussed with cardiology service and the plan is for patient to be transfer to [facility] for heart cath. Please refer to H&P written by Dr. Clearence Ped for further ifo/details on admission.   Plan: -gentle hydration prior to cath initiated -continue heparin drip -continue ASA, plavix, Coreg, Imdur, Lisinopril and statin -follow cath results and further recommendations.   Barton Dubois MD [phone]

## 2021-05-06 ENCOUNTER — Other Ambulatory Visit (HOSPITAL_COMMUNITY): Payer: Self-pay

## 2021-05-06 ENCOUNTER — Observation Stay (HOSPITAL_COMMUNITY): Payer: 59

## 2021-05-06 ENCOUNTER — Encounter (HOSPITAL_COMMUNITY): Payer: Self-pay | Admitting: Cardiovascular Disease

## 2021-05-06 DIAGNOSIS — R072 Precordial pain: Secondary | ICD-10-CM | POA: Diagnosis not present

## 2021-05-06 DIAGNOSIS — E119 Type 2 diabetes mellitus without complications: Secondary | ICD-10-CM | POA: Diagnosis not present

## 2021-05-06 DIAGNOSIS — I2510 Atherosclerotic heart disease of native coronary artery without angina pectoris: Secondary | ICD-10-CM | POA: Diagnosis not present

## 2021-05-06 DIAGNOSIS — I25110 Atherosclerotic heart disease of native coronary artery with unstable angina pectoris: Secondary | ICD-10-CM | POA: Diagnosis not present

## 2021-05-06 DIAGNOSIS — R778 Other specified abnormalities of plasma proteins: Secondary | ICD-10-CM | POA: Diagnosis not present

## 2021-05-06 DIAGNOSIS — I10 Essential (primary) hypertension: Secondary | ICD-10-CM | POA: Diagnosis not present

## 2021-05-06 DIAGNOSIS — Z20822 Contact with and (suspected) exposure to covid-19: Secondary | ICD-10-CM | POA: Diagnosis not present

## 2021-05-06 LAB — CBC
HCT: 30.7 % — ABNORMAL LOW (ref 36.0–46.0)
Hemoglobin: 9.5 g/dL — ABNORMAL LOW (ref 12.0–15.0)
MCH: 24.6 pg — ABNORMAL LOW (ref 26.0–34.0)
MCHC: 30.9 g/dL (ref 30.0–36.0)
MCV: 79.5 fL — ABNORMAL LOW (ref 80.0–100.0)
Platelets: 171 10*3/uL (ref 150–400)
RBC: 3.86 MIL/uL — ABNORMAL LOW (ref 3.87–5.11)
RDW: 16.5 % — ABNORMAL HIGH (ref 11.5–15.5)
WBC: 4.2 10*3/uL (ref 4.0–10.5)
nRBC: 0 % (ref 0.0–0.2)

## 2021-05-06 LAB — ECHOCARDIOGRAM COMPLETE
Area-P 1/2: 2.54 cm2
Calc EF: 62.3 %
Height: 59 in
S' Lateral: 2.6 cm
Single Plane A2C EF: 57.4 %
Single Plane A4C EF: 63.6 %
Weight: 2980.62 oz

## 2021-05-06 LAB — BASIC METABOLIC PANEL
Anion gap: 9 (ref 5–15)
BUN: 19 mg/dL (ref 6–20)
CO2: 23 mmol/L (ref 22–32)
Calcium: 8.6 mg/dL — ABNORMAL LOW (ref 8.9–10.3)
Chloride: 104 mmol/L (ref 98–111)
Creatinine, Ser: 0.73 mg/dL (ref 0.44–1.00)
GFR, Estimated: >60 mL/min (ref >60)
Glucose, Bld: 239 mg/dL — ABNORMAL HIGH (ref 70–99)
Potassium: 4.1 mmol/L (ref 3.5–5.1)
Sodium: 136 mmol/L (ref 135–145)

## 2021-05-06 LAB — GLUCOSE, CAPILLARY
Glucose-Capillary: 199 mg/dL — ABNORMAL HIGH (ref 70–99)
Glucose-Capillary: 250 mg/dL — ABNORMAL HIGH (ref 70–99)

## 2021-05-06 LAB — HEMOGLOBIN A1C
Hgb A1c MFr Bld: 9.8 % — ABNORMAL HIGH (ref 4.8–5.6)
Mean Plasma Glucose: 235 mg/dL

## 2021-05-06 MED ORDER — PANTOPRAZOLE SODIUM 40 MG PO TBEC
40.0000 mg | DELAYED_RELEASE_TABLET | Freq: Every day | ORAL | 3 refills | Status: DC
  Filled 2021-05-06: qty 90, 90d supply, fill #0

## 2021-05-06 MED ORDER — NITROGLYCERIN 0.4 MG SL SUBL
0.4000 mg | SUBLINGUAL_TABLET | SUBLINGUAL | 3 refills | Status: DC | PRN
  Filled 2021-05-06: qty 25, 1d supply, fill #0

## 2021-05-06 MED ORDER — ISOSORBIDE MONONITRATE ER 60 MG PO TB24
60.0000 mg | ORAL_TABLET | Freq: Every day | ORAL | 3 refills | Status: DC
  Filled 2021-05-06: qty 90, 90d supply, fill #0

## 2021-05-06 MED ORDER — CARVEDILOL 25 MG PO TABS
25.0000 mg | ORAL_TABLET | Freq: Two times a day (BID) | ORAL | 3 refills | Status: DC
  Filled 2021-05-06: qty 60, 30d supply, fill #0

## 2021-05-06 MED ORDER — CLOPIDOGREL BISULFATE 75 MG PO TABS
75.0000 mg | ORAL_TABLET | Freq: Every day | ORAL | 3 refills | Status: DC
  Filled 2021-05-06: qty 90, 90d supply, fill #0

## 2021-05-06 MED ORDER — ATORVASTATIN CALCIUM 80 MG PO TABS
80.0000 mg | ORAL_TABLET | Freq: Every day | ORAL | 3 refills | Status: DC
  Filled 2021-05-06: qty 90, 90d supply, fill #0

## 2021-05-06 MED FILL — Heparin Sodium (Porcine) Inj 1000 Unit/ML: INTRAMUSCULAR | Qty: 10 | Status: AC

## 2021-05-06 MED FILL — Verapamil HCl IV Soln 2.5 MG/ML: INTRAVENOUS | Qty: 2 | Status: AC

## 2021-05-06 NOTE — TOC Benefit Eligibility Note (Signed)
Transition of Care [facility]) Benefit Eligibility Note    Patient Details  Name: Amy Snow MRN: [id number] Date of Birth: 09/07/1966   Medication/Dose: Wilder Glade [address] [place]- $200.00   and   JARDIANCE  [address] [place]- $200.00  TIER-3 DRUG  P/A-NO  Covered?: Yes  Tier: 3 Drug  Prescription Coverage Preferred Pharmacy: [facility] with Person/Company/Phone Number:: DENICE  @ [employer] WT #  [phone]  Co-Pay: $200.00  Prior Approval: No  Deductible: Unmet (OUT-OF-POCKET:UNMET)  Additional Notes: VASCEPA  2 GM BID : NON-FORMULARY  P/A _ YES #  [phone]    Memory Argue Phone Number: 05/06/2021, 2:54 PM

## 2021-05-06 NOTE — Progress Notes (Signed)
Progress Note  Patient Name: Amy Snow Date of Encounter: 05/06/2021  [facility] Cardiologist: Carlyle Dolly, MD   Subjective   Overall doing well, but had an episode of tightness in her neck last night. She is concerned about how she can determine what pain is her heart vs other causes. We discussed this at length today. Breathing stable, no central chest pain/pressure.   Inpatient Medications    Scheduled Meds:  aspirin  81 mg Oral Daily   atorvastatin  80 mg Oral Daily   carvedilol  25 mg Oral BID   clopidogrel  75 mg Oral Daily   enoxaparin (LOVENOX) injection  40 mg Subcutaneous Q24H   insulin aspart  0-15 Units Subcutaneous TID WC   insulin aspart  0-5 Units Subcutaneous QHS   insulin detemir  12 Units Subcutaneous BID   isosorbide mononitrate  60 mg Oral Daily   lisinopril  20 mg Oral Daily   mouth rinse  15 mL Mouth Rinse BID   pantoprazole  40 mg Oral Daily   sertraline  150 mg Oral QHS   sodium chloride flush  3 mL Intravenous Q12H   traZODone  50 mg Oral QHS   Continuous Infusions:  sodium chloride     sodium chloride 1 mL/kg/hr (05/05/21 1248)   PRN Meds: sodium chloride, acetaminophen, ALPRAZolam, diazepam, nitroGLYCERIN, ondansetron (ZOFRAN) IV, sodium chloride flush   Vital Signs    Vitals:   05/05/21 2012 05/06/21 0016 05/06/21 0455 05/06/21 0804  BP:  131/87 112/69 123/64  Pulse:  75 72 72  Resp:  18 18 16   Temp: 97.8 F (36.6 C) 97.8 F (36.6 C) 98.1 F (36.7 C) 98 F (36.7 C)  TempSrc:  Oral Oral Oral  SpO2:  97% 99% 99%  Weight:      Height:        Intake/Output Summary (Last 24 hours) at 05/06/2021 1158 Last data filed at 05/05/2021 2016 Gross per 24 hour  Intake --  Output 450 ml  Net -450 ml   Last 3 Weights 05/05/2021 05/04/2021 08/15/2020  Weight (lbs) 186 lb 4.6 oz 185 lb 190 lb  Weight (kg) 84.5 kg 83.915 kg 86.183 kg      Telemetry    NSR- Personally Reviewed  ECG    05/06/20 NSR, inferior Q waves,  nonspecific ST pattern in V6- Personally Reviewed  Physical Exam   GEN: No acute distress.   Neck: No JVD Cardiac: RRR, no murmurs, rubs, or gallops. Cath site c/d/i Respiratory: Clear to auscultation bilaterally. GI: Soft, nontender, non-distended  MS: No edema; No deformity. Neuro:  Nonfocal  Psych: Normal affect   Labs    High Sensitivity Troponin:   Recent Labs  Lab 05/04/21 2004 05/04/21 2206 05/05/21 0315  TROPONINIHS 66* 68* 68*      Chemistry Recent Labs  Lab 05/04/21 2004 05/05/21 1844 05/06/21 0138  NA 133*  --  136  K 3.9  --  4.1  CL 101  --  104  CO2 20*  --  23  GLUCOSE 256*  --  239*  BUN 43*  --  19  CREATININE 1.09* 0.65 0.73  CALCIUM 8.6*  --  8.6*  GFRNONAA >60 >60 >60  ANIONGAP 12  --  9     Hematology Recent Labs  Lab 05/04/21 2004 05/05/21 1844 05/06/21 0138  WBC 6.6 4.6 4.2  RBC 4.55 4.22 3.86*  HGB 11.3* 10.3* 9.5*  HCT 36.8 33.7* 30.7*  MCV 80.9 79.9*  79.5*  MCH 24.8* 24.4* 24.6*  MCHC 30.7 30.6 30.9  RDW 16.9* 16.7* 16.5*  PLT 212 187 171    BNPNo results for input(s): BNP, PROBNP in the last 168 hours.   DDimer No results for input(s): DDIMER in the last 168 hours.   Radiology    DG Chest 2 View  Result Date: 05/04/2021 CLINICAL DATA:  Chest tightness radiating up to the neck. Ongoing since last week. EXAM: CHEST - 2 VIEW COMPARISON:  02/13/2020 FINDINGS: The heart size and mediastinal contours are within normal limits. Both lungs are clear. The visualized skeletal structures are unremarkable. IMPRESSION: No active cardiopulmonary disease. Electronically Signed   By: Lucienne Capers M.D.   On: 05/04/2021 19:45   CARDIAC CATHETERIZATION  Result Date: 05/05/2021  Previously placed RPDA stent (unknown type) is widely patent.  Prox RCA lesion is 30% stenosed.  Mid RCA lesion is 30% stenosed.  Previously placed Prox LAD to Mid LAD stent (unknown type) is widely patent.  1st Mrg lesion is 80% stenosed.  Prox Cx to Mid  Cx lesion is 70% stenosed.  Mid Cx to Dist Cx lesion is 80% stenosed.  2nd RPL lesion is 30% stenosed.  Widely patent previously placed stents in the proximal to mid LAD and PDA of the right coronary artery.  The LAD is otherwise normal and the RCA has mild nonobstructive 30% proximal and mid smooth stenosis. The left circumflex vessel is very  small caliber with improvement in prior stenoses from 95 and 90% to 70% and 80%. Normal LV function with EF estimated 55 to 60% without focal segmental wall motion abnormality.  LVEDP 12 mmHg. RECOMMENDATION: Continue DAPT with aspirin/Plavix.   Resumption of high potency statin therapy with atorvastatin 80 mg and  add Vascepa 2 capsules twice a day and fenofibrate with marked triglyceride elevation of 851.  We will increase medical therapy with isosorbide mononitrate to 60 mg and carvedilol recently increased to 25 mg twice a day.  Plan initial medical management unless recurrent symptomatology.    Cardiac Studies   Echo pending today  Cardiac cath 05/05/21 Previously placed RPDA stent (unknown type) is widely patent. Prox RCA lesion is 30% stenosed. Mid RCA lesion is 30% stenosed. Previously placed Prox LAD to Mid LAD stent (unknown type) is widely patent. 1st Mrg lesion is 80% stenosed. Prox Cx to Mid Cx lesion is 70% stenosed. Mid Cx to Dist Cx lesion is 80% stenosed. 2nd RPL lesion is 30% stenosed.   Widely patent previously placed stents in the proximal to mid LAD and PDA of the right coronary artery.  The LAD is otherwise normal and the RCA has mild nonobstructive 30% proximal and mid smooth stenosis.   The left circumflex vessel is very  small caliber with improvement in prior stenoses from 95 and 90% to 70% and 80%.   Normal LV function with EF estimated 55 to 60% without focal segmental wall motion abnormality.  LVEDP 12 mmHg.   RECOMMENDATION: Continue DAPT with aspirin/Plavix.   Resumption of high potency statin therapy with atorvastatin 80  mg and  add Vascepa 2 capsules twice a day and fenofibrate with marked triglyceride elevation of 851.  We will increase medical therapy with isosorbide mononitrate to 60 mg and carvedilol recently increased to 25 mg twice a day.  Plan initial medical management unless recurrent symptomatology.  Patient Profile     55 y.o. female with PMH CAD with prior PCI (SI 2016 with DES to PDA, cath 2020 with  DES to LAD), cardiomyopathy with recovered EF, hypertension, hyperlipidemia, type II diabetes, and fibromyalgia. She was transferred from [facility] for cardiac catheterization.  Assessment & Plan    Chest/neck pain Elevated troponin History of CAD with prior PCI -cath reviewed, as above. No acute changes -hsTnI mildly elevated and flat, less consistent with ACS -we discussed chest pain at length today. Her pain has been nitro responsive in the past. She also notes tenderness on examination of SCM and muscles in the neck. We discussed that there may be multiple etiologies to her pain. Discussed red flag warning signs that need immediate medical attention -continue aspirin, clopidogrel, statin -continue isosorbide, PRN nitro, carvedilol  Cardiomyopathy, with recovered EF -appears euvolemic -echo pending  Hypertension -continue carvedilol 25 mg BID -continue lisinopril 20 mg daily, imdur 30 mg daily  Hyperlipidemia, mixed -restarted atorvastatin 80 mg this hospitalization, was out prior to admission TG elevated, if not improved would consider vascepa as an outpatient -ldl 123 this admission, follow up as outpatient -pattern consistent with metabolic syndrome  Type II diabetes: A1c 9.8 -given CAD, would recommend either SGLT2i or GLP1RA. If EF reduced on echo, would pursue SGLT2i. If this is started, can hold HCTZ  If echo stable, anticipate discharge later today.  For questions or updates, please contact [place] Please consult [email or web address] for contact info under       Signed, Buford Dresser, MD  05/06/2021, 11:58 AM

## 2021-05-06 NOTE — Progress Notes (Signed)
  Echocardiogram 2D Echocardiogram has been performed.  Amy Snow 05/06/2021, 12:06 PM

## 2021-05-06 NOTE — Progress Notes (Signed)
Inpatient Diabetes Program Recommendations  AACE/ADA: New Consensus Statement on Inpatient Glycemic Control (2015)  Target Ranges:  Prepandial:   less than 140 mg/dL      Peak postprandial:   less than 180 mg/dL (1-2 hours)      Critically ill patients:  140 - 180 mg/dL   Lab Results  Component Value Date   GLUCAP 250 (H) 05/06/2021   HGBA1C 9.8 (H) 05/05/2021    Review of Glycemic Control  Diabetes history: DM2 Outpatient Diabetes medications: Novolin ReliOn 70/30 40-80 units BID Current orders for Inpatient glycemic control: Levemir 12 units BID, Novolog 0-15 units TID with meals and 0-5 HS  HgbA1C - 9.8% - pt states this is down from 11%.  Inpatient Diabetes Program Recommendations:    Spoke with pt over phone about her glycemic control at home and HgbA1C of 9.8%. Pt states she takes Novolin ReliOn 70/30 insulin from [place] because she couldn't afford the expensive insulin that she was prescribed in the past. Michela Pitcher she takes 40-50 units if in 200s, and 80 units if in 300s. Said HgbA1C was 11% and was surprised it was down to 9.8%. 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 Denies regular sodas and sweet tea. Rarely has hypos. States she needs to make MD appt to f/u with her PCP. Monitors blood sugars 2-3x/day. Talked about importance of improving blood sugar control to reduce risk of long and short-term complications related to poor control. Needs PCP to follow more closely and titrate her insulin. Pt seems motivated to improve her blood sugar control.  Thank you. Lorenda Peck, RD, LDN, CDE Inpatient Diabetes Coordinator [phone]

## 2021-05-06 NOTE — Discharge Instructions (Addendum)
PLEASE REMEMBER TO BRING ALL OF YOUR MEDICATIONS TO EACH OF YOUR FOLLOW-UP OFFICE VISITS.  PLEASE ATTEND ALL SCHEDULED FOLLOW-UP APPOINTMENTS.   Activity: Increase activity slowly as tolerated. You may shower, but no soaking baths (or swimming) for 1 week. No driving for 24 hours. No lifting over 5 lbs for 1 week. No sexual activity for 1 week.   You May Return to Work: in 1 week (if applicable)  Wound Care: You may wash cath site gently with soap and water. Keep cath site clean and dry. If you notice pain, swelling, bleeding or pus at your cath site, please call [phone].   Medication changes: - INCREASE imdur (isosorbide mononitrate) to 60mg  daily - STOP omeprazole - this medication can decrease the effectiveness of plavix  - START pantoprazole 40mg  daily for reflux - this medication works similarly to omeprazole - RESTART atorvastatin 80mg  daily - our office will be in touch to arrange a lipid clinic appointment to help manage your cholesterol. Unfortunately these visit are only done in our [place] office - sorry for any inconvenience

## 2021-05-06 NOTE — TOC Transition Note (Signed)
Transition of Care [facility]) - CM/SW Discharge Note   Patient Details  Name: Amy Snow MRN: [id number] Date of Birth: [birth year]  [facility] CM/SW Contact:  Zenon Mayo, RN Phone Number: 05/06/2021, 4:24 PM   Clinical Narrative:    NCM gave patient outpatient [facility] resources.she is for dc today.   Final next level of care: Home/Self Care Barriers to Discharge: No Barriers Identified   Patient Goals and CMS Choice Patient states their goals for this hospitalization and ongoing recovery are:: return home   Choice offered to / list presented to : NA  Discharge Placement                       Discharge Plan and Services                  DME Agency: NA       HH Arranged: NA          Social Determinants of Health (SDOH) Interventions     Readmission Risk Interventions No flowsheet data found.

## 2021-05-06 NOTE — Discharge Summary (Signed)
Discharge Summary    Patient ID: Amy Snow MRN: [id number]; DOB: [birth year]  Admit date: 05/04/2021 Discharge date: 05/06/2021  PCP:  Amy Haste, NP   [facility] Providers Cardiologist:  Amy Dolly, MD        Discharge Diagnoses    Principal Problem:   Chest pain Active Problems:   Depression with anxiety   Diabetes mellitus ([place])   Essential hypertension   CAD in native artery   GERD (gastroesophageal reflux disease)   Elevated troponin    Diagnostic Studies/Procedures    LHC 05/05/21: Previously placed RPDA stent (unknown type) is widely patent. Prox RCA lesion is 30% stenosed. Mid RCA lesion is 30% stenosed. Previously placed Prox LAD to Mid LAD stent (unknown type) is widely patent. 1st Mrg lesion is 80% stenosed. Prox Cx to Mid Cx lesion is 70% stenosed. Mid Cx to Dist Cx lesion is 80% stenosed. 2nd RPL lesion is 30% stenosed.   Widely patent previously placed stents in the proximal to mid LAD and PDA of the right coronary artery.  The LAD is otherwise normal and the RCA has mild nonobstructive 30% proximal and mid smooth stenosis.   The left circumflex vessel is very  small caliber with improvement in prior stenoses from 95 and 90% to 70% and 80%.   Normal LV function with EF estimated 55 to 60% without focal segmental wall motion abnormality.  LVEDP 12 mmHg.   RECOMMENDATION: Continue DAPT with aspirin/Plavix.   Resumption of high potency statin therapy with atorvastatin 80 mg and  add Vascepa 2 capsules twice a day and fenofibrate with marked triglyceride elevation of 851.  We will increase medical therapy with isosorbide mononitrate to 60 mg and carvedilol recently increased to 25 mg twice a day.  Plan initial medical management unless recurrent symptomatology.  Echocardiogram 05/06/21: 1. Left ventricular ejection fraction, by estimation, is 60 to 65%. The  left ventricle has normal function. The left ventricle has no regional  wall  motion abnormalities. There is moderate concentric left ventricular  hypertrophy. Left ventricular  diastolic parameters are consistent with Grade I diastolic dysfunction  (impaired relaxation).   2. Right ventricular systolic function is normal. The right ventricular  size is normal. Tricuspid regurgitation signal is inadequate for assessing  PA pressure.   3. The mitral valve is grossly normal. No evidence of mitral valve  regurgitation. No evidence of mitral stenosis.   4. The aortic valve is tricuspid. There is mild calcification of the  aortic valve. There is mild thickening of the aortic valve. Aortic valve  regurgitation is not visualized. Mild aortic valve sclerosis is present,  with no evidence of aortic valve  stenosis.   5. The inferior vena cava is dilated in size with >50% respiratory  variability, suggesting right atrial pressure of 8 mmHg.   Comparison(s): A prior study was performed on 06/14/2019. No significant  change from prior study. Prior images reviewed side by side.  _____________   History of Present Illness     Amy Snow is a 55 y.o. female with a past medical history of CAD (s/p Amy Snow in 2016 with DES x2 to PDA, cath in 05/2019 with DES to LAD), HFimpEF (EF 25% by cath in 06/2015 and normalized by repeat imaging), HTN, HLD, Type 2 DM and Fibromyalgia.   Amy Snow called the office on 05/04/2021 reporting intermittent chest pain over the past week which was responsive to SL NTG and she was advised to go to the ED  for further evaluation. Symptoms started last Tuesday, 04/28/21 with episodes of chest pressure which would radiate into her neck. Symptoms would typically occur with activity and resolve with rest. 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 She has taken SL NTG with improvement in her symptoms. Reports associated nausea and diaphoresis over the past week but thought it might have been due to an illness she was exposed to  at her job. She denies any orthopnea, PND or pitting edema. She had recurrent pain 05/04/21 which again resolved with SL NTG. Denies any recurrent symptoms since admission. Reports compliance with her medications except for Atorvastatin as this had not been refilled by her pharmacy.    Initial labs show WBC 6.6, Hgb 11.3, platelets 212, Na+ 133, K+ 3.9 and creatinine 1.09 (baseline 0.8 - 0.9). COVID negative. FLP shows total cholesterol of 322, triglycerides 851, HDL 29 and direct LDL pending. Initial Hs Troponin 66 with repeat values of 68 and 68. CXR with no active cardiopulmonary disease. EKG shows NSR, HR 93 with ST depression along the lateral leads which is new when compared to prior tracings.   EDP spoke with the on-call overnight Cardiology provider who recommended transfer to [facility] in anticipation of possible intervention but she was admitted to [facility] instead.    Hospital Course     Consultants: None   Unstable angina and elevated HsTrop in patient with CAD s/p PCI/DES x2 to PDA in 2016 and LAD 05/2019: patient presented with accelerated anginal symptoms. EKG non-ischemic. HsTrop mildly elevated to 66>68. She underwent a LHC 05/05/21 which showed patent PDA and p-mLAD stents, 30% prox and mid RCA stenosis, 80% OM1 stenosis, 70% prox and 80% mid Lcx stenosis (improved from 95 and 90% respectively), with EF 55-60%. Echo showed EF 60-65%, moderate LVH, G1DD, and no significant valvular abnormalities. Imdur increased to 60mg  daily - Continue aspirin and plavix - Continue statin - Continue imdur and Bblocker  Chronic combined CHF: history of EF 25% in 2016 at the time of her MI. EF recovered since that time. EF 60-65% this admission. She appears euvolemic on exam - Continue carvedilol and lisinopril  - Continue prn HCTZ   HTN: BP stable this admission - Continue carvedilol and lisinopril  HLD: LDL 123 and Triglycerides 851 this admission. She thinks she's been off atorvastatin for  1.5 months. Restarted atorvastatin 80mg  daily this admission.  - Continue atorvastatin 80mg  daily - Repeat FLP/LFTs in 6-8 weeks - Referral placed to lipid clinic given triglycerides >500 and historically difficult to control lipids  DM type 2: A1C 9.8 this admission; goal <7 - Continue insulin - Unfortunately farxiga and jardiance have $200 copay's and are not feasible at this time.   GERD: on omeprazole prior to admission. - Transitioned to pantoprazole given interaction between plavix and omeprazole  Depression/Anxiety: likely contributing to some of her chest pain issues. Has been referred to behavioral health outpatient but not been seen. CM provided outpatient resource information - Continue home zoloft, amitriptyline, and trazodone   Did the patient have an acute coronary syndrome (MI, NSTEMI, Amy Snow, etc) this admission?:  No                               Did the patient have a percutaneous coronary intervention (stent / angioplasty)?:  No.       _____________  Discharge Vitals Blood pressure (!) 108/55,  pulse 71, temperature 98 F (36.7 C), temperature source Oral, resp. rate 18, height 4\' 11"  (1.499 m), weight 84.5 kg, last menstrual period 12/05/2015, SpO2 96 %.  Filed Weights   05/04/21 1859 05/05/21 0107  Weight: 83.9 kg 84.5 kg    Labs & Radiologic Studies    CBC Recent Labs    05/05/21 1844 05/06/21 0138  WBC 4.6 4.2  HGB 10.3* 9.5*  HCT 33.7* 30.7*  MCV 79.9* 79.5*  PLT 187 614   Basic Metabolic Panel Recent Labs    05/04/21 2004 05/05/21 1844 05/06/21 0138  NA 133*  --  136  K 3.9  --  4.1  CL 101  --  104  CO2 20*  --  23  GLUCOSE 256*  --  239*  BUN 43*  --  19  CREATININE 1.09* 0.65 0.73  CALCIUM 8.6*  --  8.6*   Liver Function Tests No results for input(s): AST, ALT, ALKPHOS, BILITOT, PROT, ALBUMIN in the last 72 hours. No results for input(s): LIPASE, AMYLASE in the last 72 hours. High Sensitivity Troponin:   Recent Labs  Lab  05/04/21 2004 05/04/21 2206 05/05/21 0315  TROPONINIHS 66* 68* 68*    BNP Invalid input(s): POCBNP D-Dimer No results for input(s): DDIMER in the last 72 hours. Hemoglobin A1C Recent Labs    05/05/21 0315  HGBA1C 9.8*   Fasting Lipid Panel Recent Labs    05/05/21 0315  CHOL 322*  HDL 29*  LDLCALC UNABLE TO CALCULATE IF TRIGLYCERIDE OVER 400 mg/dL  TRIG 851*  CHOLHDL 11.1  LDLDIRECT 123.1*   Thyroid Function Tests No results for input(s): TSH, T4TOTAL, T3FREE, THYROIDAB in the last 72 hours.  Invalid input(s): FREET3 _____________  DG Chest 2 View  Result Date: 05/04/2021 CLINICAL DATA:  Chest tightness radiating up to the neck. Ongoing since last week. EXAM: CHEST - 2 VIEW COMPARISON:  02/13/2020 FINDINGS: The heart size and mediastinal contours are within normal limits. Both lungs are clear. The visualized skeletal structures are unremarkable. IMPRESSION: No active cardiopulmonary disease. Electronically Signed   By: Lucienne Capers M.D.   On: 05/04/2021 19:45   CARDIAC CATHETERIZATION  Result Date: 05/05/2021  Previously placed RPDA stent (unknown type) is widely patent.  Prox RCA lesion is 30% stenosed.  Mid RCA lesion is 30% stenosed.  Previously placed Prox LAD to Mid LAD stent (unknown type) is widely patent.  1st Mrg lesion is 80% stenosed.  Prox Cx to Mid Cx lesion is 70% stenosed.  Mid Cx to Dist Cx lesion is 80% stenosed.  2nd RPL lesion is 30% stenosed.  Widely patent previously placed stents in the proximal to mid LAD and PDA of the right coronary artery.  The LAD is otherwise normal and the RCA has mild nonobstructive 30% proximal and mid smooth stenosis. The left circumflex vessel is very  small caliber with improvement in prior stenoses from 95 and 90% to 70% and 80%. Normal LV function with EF estimated 55 to 60% without focal segmental wall motion abnormality.  LVEDP 12 mmHg. RECOMMENDATION: Continue DAPT with aspirin/Plavix.   Resumption of high  potency statin therapy with atorvastatin 80 mg and  add Vascepa 2 capsules twice a day and fenofibrate with marked triglyceride elevation of 851.  We will increase medical therapy with isosorbide mononitrate to 60 mg and carvedilol recently increased to 25 mg twice a day.  Plan initial medical management unless recurrent symptomatology.   ECHOCARDIOGRAM COMPLETE  Result Date: 05/06/2021  ECHOCARDIOGRAM REPORT   Patient Name:   Amy Snow Date of Exam: 05/06/2021 Medical Rec #:  [id number]        Height:       59.0 in Accession #:    [id number]       Weight:       186.3 lb Date of Birth:  August 31, 1966         BSA:          1.790 m Patient Age:    55 years         BP:           123/64 mmHg Patient Gender: F                HR:           63 bpm. Exam Location:  Inpatient Procedure: 2D Echo, 3D Echo, Cardiac Doppler and Color Doppler Indications:    I25.110 Atherosclerotic heart disease of native coronary artery                 with unstable angina pectoris  History:        Patient has prior history of Echocardiogram examinations, most                 recent 06/14/2019. CAD and Previous Myocardial Infarction,                 Signs/Symptoms:Chest Pain; Risk Factors:Diabetes and                 Dyslipidemia.  Sonographer:    Roseanna Rainbow RDCS Referring Phys: [place] Comments: Technically difficult study due to poor echo windows and patient is morbidly obese. Image acquisition challenging due to patient body habitus. IMPRESSIONS  1. Left ventricular ejection fraction, by estimation, is 60 to 65%. The left ventricle has normal function. The left ventricle has no regional wall motion abnormalities. There is moderate concentric left ventricular hypertrophy. Left ventricular diastolic parameters are consistent with Grade I diastolic dysfunction (impaired relaxation).  2. Right ventricular systolic function is normal. The right ventricular size is normal. Tricuspid regurgitation signal is inadequate  for assessing PA pressure.  3. The mitral valve is grossly normal. No evidence of mitral valve regurgitation. No evidence of mitral stenosis.  4. The aortic valve is tricuspid. There is mild calcification of the aortic valve. There is mild thickening of the aortic valve. Aortic valve regurgitation is not visualized. Mild aortic valve sclerosis is present, with no evidence of aortic valve stenosis.  5. The inferior vena cava is dilated in size with >50% respiratory variability, suggesting right atrial pressure of 8 mmHg. Comparison(s): A prior study was performed on 06/14/2019. No significant change from prior study. Prior images reviewed side by side. FINDINGS  Left Ventricle: Left ventricular ejection fraction, by estimation, is 60 to 65%. The left ventricle has normal function. The left ventricle has no regional wall motion abnormalities. The left ventricular internal cavity size was normal in size. There is  moderate concentric left ventricular hypertrophy. Left ventricular diastolic parameters are consistent with Grade I diastolic dysfunction (impaired relaxation). Right Ventricle: The right ventricular size is normal. No increase in right ventricular wall thickness. Right ventricular systolic function is normal. Tricuspid regurgitation signal is inadequate for assessing PA pressure. Left Atrium: Left atrial size was normal in size. Right Atrium: Right atrial size was normal in size. Pericardium: Trivial pericardial effusion is present. Mitral Valve: The mitral valve is grossly normal. Mild  mitral annular calcification. No evidence of mitral valve regurgitation. No evidence of mitral valve stenosis. Tricuspid Valve: The tricuspid valve is normal in structure. Tricuspid valve regurgitation is not demonstrated. Aortic Valve: The aortic valve is tricuspid. There is mild calcification of the aortic valve. There is mild thickening of the aortic valve. There is mild aortic valve annular calcification. Aortic valve  regurgitation is not visualized. Mild aortic valve sclerosis is present, with no evidence of aortic valve stenosis. Pulmonic Valve: The pulmonic valve was not well visualized. Pulmonic valve regurgitation is not visualized. No evidence of pulmonic stenosis. Aorta: The aortic root and ascending aorta are structurally normal, with no evidence of dilitation. Venous: The inferior vena cava is dilated in size with greater than 50% respiratory variability, suggesting right atrial pressure of 8 mmHg. IAS/Shunts: The atrial septum is grossly normal.  LEFT VENTRICLE PLAX 2D LVIDd:         3.80 cm     Diastology LVIDs:         2.60 cm     LV e' medial:    6.64 cm/s LV PW:         1.70 cm     LV E/e' medial:  13.0 LV IVS:        1.40 cm     LV e' lateral:   9.46 cm/s LVOT diam:     2.10 cm     LV E/e' lateral: 9.1 LV SV:         45 LV SV Index:   25 LVOT Area:     3.46 cm  LV Volumes (MOD) LV vol d, MOD A2C: 84.8 ml LV vol d, MOD A4C: 55.5 ml LV vol s, MOD A2C: 36.1 ml LV vol s, MOD A4C: 20.2 ml LV SV MOD A2C:     48.7 ml LV SV MOD A4C:     55.5 ml LV SV MOD BP:      46.9 ml RIGHT VENTRICLE             IVC RV S prime:     12.10 cm/s  IVC diam: 2.10 cm TAPSE (M-mode): 2.0 cm LEFT ATRIUM             Index       RIGHT ATRIUM           Index LA diam:        3.20 cm 1.79 cm/m  RA Area:     11.70 cm LA Vol (A2C):   33.6 ml 18.78 ml/m RA Volume:   26.70 ml  14.92 ml/m LA Vol (A4C):   30.6 ml 17.10 ml/m LA Biplane Vol: 32.3 ml 18.05 ml/m  AORTIC VALVE LVOT Vmax:   83.50 cm/s LVOT Vmean:  48.500 cm/s LVOT VTI:    0.131 m  AORTA Ao Root diam: 3.30 cm MITRAL VALVE MV Area (PHT): 2.54 cm    SHUNTS MV Decel Time: 299 msec    Systemic VTI:  0.13 m MV E velocity: 86.30 cm/s  Systemic Diam: 2.10 cm MV A velocity: 82.00 cm/s MV E/A ratio:  1.05 Rudean Haskell MD Electronically signed by Rudean Haskell MD Signature Date/Time: 05/06/2021/2:26:48 PM    Final    Disposition   Pt is being discharged home today in good  condition.  Follow-up Plans & Appointments     Follow-up Information     Verta Ellen., NP Follow up on 06/01/2021.   Specialty: Cardiology Why: Please arrive 15 minutes for your 11:30am post-hospital  cardiology appointment. Contact information: [place] [address] [phone]         Amy Haste, NP Follow up.   Why: Please call to arrange a visit within 1-2 weeks of discharge for better management of your diabetes and to discuss behavioral health resources to improve your anxiety and depression. Contact information: [address] [place] [address] [phone]         Arnoldo Lenis, MD .   Specialty: Cardiology Contact information: [place] [address] [phone]                Discharge Instructions     AMB Referral to Advanced Lipid Disorders Clinic   Complete by: As directed    Reason for referral: Patients with Triglycerides >500 mg/dL   Internal Lipid Clinic Referral Scheduling  Internal lipid clinic referrals are providers within [facility], who wish to refer established patients for routine management (help in starting PCSK9 inhibitor therapy) or advanced therapies.  Internal MD referral criteria:              1. All patients with LDL>190 mg/dL  2. All patients with Triglycerides >500 mg/dL  3. Patients with suspected or confirmed heterozygous familial hyperlipidemia (HeFH) or homozygous familial hyperlipidemia (HoFH)  4. Patients with family history of suspicious for genetic dyslipidemia desiring genetic testing  5. Patients refractory to standard guideline based therapy  6. Patients with statin intolerance (failed 2 statins, one of which must be a high potency statin)  7. Patients who the provider desires to be seen by MD   Internal PharmD referral criteria:   1. Follow-up patients for medication management  2. Follow-up for compliance monitoring  3. Patients for drug  education  4. Patients with statin intolerance  5. PCSK9 inhibitor education and prior authorization approvals  6. Patients with triglycerides <500 mg/dL  External Lipid Clinic Referral  External lipid clinic referrals are for providers outside of [facility], considered new clinic patients - automatically routed to MD schedule       Discharge Medications   Allergies as of 05/06/2021   No Known Allergies      Medication List     STOP taking these medications    omeprazole 40 MG capsule Commonly known as: PRILOSEC Replaced by: pantoprazole 40 MG tablet   topiramate 25 MG tablet Commonly known as: TOPAMAX       TAKE these medications    acetaminophen 650 MG CR tablet Commonly known as: TYLENOL Take 650-1,300 mg by mouth every 8 (eight) hours as needed for pain.   amitriptyline 25 MG tablet Commonly known as: ELAVIL Take 25 mg by mouth at bedtime.   APPLE CIDER VINEGAR PO Take 900 mg by mouth in the morning and at bedtime. 450 mg/tablet   aspirin 81 MG chewable tablet Chew 1 tablet (81 mg total) by mouth daily.   atorvastatin 80 MG tablet Commonly known as: LIPITOR Take 1 tablet (80 mg total) by mouth daily. Start taking on: May 07, 2021 What changed: See the new instructions.   carvedilol 25 MG tablet Commonly known as: COREG Take 1 tablet (25 mg total) by mouth 2 (two) times daily. What changed: Another medication with the same name was removed. Continue taking this medication, and follow the directions you see here.   clopidogrel 75 MG tablet Commonly known as: PLAVIX Take 1 tablet (75 mg total) by mouth daily. Start taking on:  May 07, 2021   hydrochlorothiazide 12.5 MG capsule Commonly known as: MICROZIDE TAKE 1 CAPSULE BY MOUTH ONCE DAILY-TAKE AN EXTRA CAPSULE DAILY AS NEEDED FOR WEIGHT GAIN OF 3 LBS IN ONE DAY OR 5 LBS IN ONE WEEK   insulin NPH-regular Human (70-30) 100 UNIT/ML injection Inject 80 Units into the skin 2 (two) times a day.    isosorbide mononitrate 60 MG 24 hr tablet Commonly known as: IMDUR Take 1 tablet (60 mg total) by mouth daily. Start taking on: May 07, 2021 What changed:  medication strength how much to take   lisinopril 20 MG tablet Commonly known as: ZESTRIL Take 1 tablet (20 mg total) by mouth daily.   nitroGLYCERIN 0.4 MG SL tablet Commonly known as: NITROSTAT Place 1 tablet (0.4 mg total) under the tongue every 5 (five) minutes x 3 doses as needed for chest pain. What changed: reasons to take this   pantoprazole 40 MG tablet Commonly known as: PROTONIX Take 1 tablet (40 mg total) by mouth daily. Start taking on: May 07, 2021 Replaces: omeprazole 40 MG capsule   sertraline 100 MG tablet Commonly known as: ZOLOFT Take 150 mg by mouth at bedtime.   traZODone 50 MG tablet Commonly known as: DESYREL Take 50 mg by mouth at bedtime.           Outstanding Labs/Studies   Will need repeat FLP/LFTs in 6-8 weeks  Duration of Discharge Encounter   Greater than 30 minutes including physician time.  Signed, Abigail Butts, PA-C 05/06/2021, 4:15 PM

## 2021-05-11 ENCOUNTER — Other Ambulatory Visit: Payer: Self-pay

## 2021-05-11 MED ORDER — HYDROCHLOROTHIAZIDE 12.5 MG PO CAPS: ORAL_CAPSULE | ORAL | 0 refills | Status: DC

## 2021-05-11 NOTE — Telephone Encounter (Signed)
Refilled HCTZ to [facility], has apt 06/01/21 with a.Quinn, NP

## 2021-05-13 ENCOUNTER — Other Ambulatory Visit (HOSPITAL_COMMUNITY): Payer: Self-pay

## 2021-05-13 ENCOUNTER — Telehealth (HOSPITAL_COMMUNITY): Payer: Self-pay

## 2021-05-13 NOTE — Telephone Encounter (Signed)
Transitions of Care Pharmacy  ° °Call attempted for a pharmacy transitions of care follow-up. HIPAA appropriate voicemail was left with call back information provided.  ° °Call attempt #1. Will follow-up in 2-3 days.  °  °

## 2021-05-19 ENCOUNTER — Telehealth (HOSPITAL_COMMUNITY): Payer: Self-pay | Admitting: Pharmacist

## 2021-05-20 ENCOUNTER — Telehealth (HOSPITAL_COMMUNITY): Payer: Self-pay

## 2021-05-20 NOTE — Telephone Encounter (Signed)
Transitions of Care Pharmacy   Call attempted for a pharmacy transitions of care follow-up. HIPAA appropriate voicemail was left with call back information provided.   Call attempt #2. Will follow-up in 2-3 days.    

## 2021-05-22 ENCOUNTER — Telehealth (HOSPITAL_COMMUNITY): Payer: Self-pay

## 2021-05-22 NOTE — Telephone Encounter (Signed)
Transitions of Care Pharmacy   Call attempted for a pharmacy transitions of care follow-up. HIPAA appropriate voicemail was left with call back information provided.   Call attempt #3. Will no longer attempt follow up calls for [facility] pharmacy.

## 2021-05-31 NOTE — Progress Notes (Deleted)
Cardiology Office Note  Date: 05/31/2021   ID: Amy, Snow 02-29-1966, MRN [id number]  PCP:  Wyatt Haste, NP  Cardiologist:  Carlyle Dolly, MD Electrophysiologist:  None   Chief Complaint: Hospital follow up Chest pain / Cardiac catheterization.   History of Present Illness:  Amy Snow is a 55 y.o. female with a history of STEMI 2016 status post DES x2 PDA with medical treatment for residual disease.  HTN, HLD, GERD, DM, fibromyalgia, mild anemia, anxiety, angina.  May 15, 2019 she had an office visit concerning for progressive angina.  She was admitted May 25, 2019 for outpatient cath and had a DES to LAD with medical treatment for PDA/PL disease.  EF was 50 to 55% at cath and 55 to 60% by echo.  She called the office on 05/04/2021 complaining of intermittent chest pain responsive to SL NTG. Symptoms had started on 04/28/2021. HS troponin was 66 and subsequent values of 68-68. She had ST depression in lateral leads. She presented to [facility] ED and was admitted but ultimately transferred to [facility] and underwent cardiac catheterization. See report below. She had no intervention. Imdur was increased to 60 mg po daily. Vascepa was started with fenofibrate due to elevated triglycerides of 851. She was continuing aspirin, Plavix, atorvastatin, Imdur, carvedilol, and lisinopril..     Past Medical History:  Diagnosis Date   Anxiety and depression    CAD (coronary artery disease)    a. s/p STEMI 2016 s/p DES x 2 PDA with residual dz treated medically. b. [place] in 2020 with cath s/p DES to long high grade lesion in LAD, residual disease treated medically, EF 50%.   Diabetes mellitus, type II ([place])    Family history of adverse reaction to anesthesia    PONV   Fibromyalgia    GERD (gastroesophageal reflux disease)    Hyperlipidemia    Hypertension    Ischemic cardiomyopathy    a. EF <25% in 2016, improved to normal thereafter. b. EF 50% in 05/2019.   Myocardial infarct [facility])     august 2016    Past Surgical History:  Procedure Laterality Date   BIOPSY  08/15/2020   Procedure: BIOPSY;  Surgeon: Harvel Quale, MD;  Location: [facility];  Service: Gastroenterology;;   CARDIAC CATHETERIZATION N/A 07/05/2015   Procedure: Left Heart Cath and Coronary Angiography;  Surgeon: Lorretta Harp, MD;  Location: [place] [facility];  Service: Cardiovascular;  Laterality: N/A;   CESAREAN SECTION     COLONOSCOPY WITH PROPOFOL N/A 08/15/2020   Procedure: COLONOSCOPY WITH PROPOFOL;  Surgeon: Harvel Quale, MD;  Location: [facility];  Service: Gastroenterology;  Laterality: N/A;  1230   CORONARY STENT INTERVENTION N/A 05/24/2019   Procedure: CORONARY STENT INTERVENTION;  Surgeon: Lorretta Harp, MD;  Location: [place] [facility];  Service: Cardiovascular;  Laterality: N/A;   ESOPHAGEAL DILATION N/A 08/15/2020   Procedure: ESOPHAGEAL DILATION;  Surgeon: Harvel Quale, MD;  [facility];  Service: Gastroenterology;  Laterality: N/A;   ESOPHAGOGASTRODUODENOSCOPY (EGD) WITH PROPOFOL N/A 08/15/2020   Procedure: ESOPHAGOGASTRODUODENOSCOPY (EGD) WITH PROPOFOL;  Surgeon: Harvel Quale, MD;  [facility];  Service: Gastroenterology;  Laterality: N/A;   HERNIA REPAIR Left    x2 ventral and inguinal   LEFT HEART CATH AND CORONARY ANGIOGRAPHY N/A 05/24/2019   Procedure: LEFT HEART CATH AND CORONARY ANGIOGRAPHY;  Surgeon: Lorretta Harp, MD;  Location: [place] [facility];  Service: Cardiovascular;  Laterality: N/A;   LEFT HEART CATH AND CORONARY ANGIOGRAPHY N/A 05/05/2021   Procedure: LEFT HEART CATH AND CORONARY ANGIOGRAPHY;  Surgeon: Troy Sine, MD;  Location: [place] [facility];  Service: Cardiovascular;  Laterality: N/A;   POLYPECTOMY  08/15/2020   Procedure: POLYPECTOMY;  Surgeon: Montez Morita, Quillian Quince, MD;  Location[facility];  Service: Gastroenterology;;   TONSILLECTOMY     TUBAL LIGATION      Current  Outpatient Medications  Medication Sig Dispense Refill   acetaminophen (TYLENOL) 650 MG CR tablet Take 650-1,300 mg by mouth every 8 (eight) hours as needed for pain.      amitriptyline (ELAVIL) 25 MG tablet Take 25 mg by mouth at bedtime.     APPLE CIDER VINEGAR PO Take 900 mg by mouth in the morning and at bedtime. 450 mg/tablet     aspirin 81 MG chewable tablet Chew 1 tablet (81 mg total) by mouth daily. 90 tablet 3   atorvastatin (LIPITOR) 80 MG tablet Take 1 tablet (80 mg total) by mouth daily. 90 tablet 3   carvedilol (COREG) 25 MG tablet Take 1 tablet (25 mg total) by mouth 2 (two) times daily. 180 tablet 3   clopidogrel (PLAVIX) 75 MG tablet Take 1 tablet (75 mg total) by mouth daily. 90 tablet 3   hydrochlorothiazide (MICROZIDE) 12.5 MG capsule TAKE 1 CAPSULE BY MOUTH ONCE DAILY-TAKE AN EXTRA CAPSULE DAILY AS NEEDED FOR WEIGHT GAIN OF 3 LBS IN ONE DAY OR 5 LBS IN ONE WEEK 90 capsule 0   insulin NPH-regular Human (70-30) 100 UNIT/ML injection Inject 80 Units into the skin 2 (two) times a day.      isosorbide mononitrate (IMDUR) 60 MG 24 hr tablet Take 1 tablet (60 mg total) by mouth daily. 90 tablet 3   lisinopril (ZESTRIL) 20 MG tablet Take 1 tablet (20 mg total) by mouth daily. 90 tablet 3   nitroGLYCERIN (NITROSTAT) 0.4 MG SL tablet Place 1 tablet (0.4 mg total) under the tongue every 5 (five) minutes x 3 doses as needed for chest pain. 25 tablet 3   pantoprazole (PROTONIX) 40 MG tablet Take 1 tablet (40 mg total) by mouth daily. 90 tablet 3   sertraline (ZOLOFT) 100 MG tablet Take 150 mg by mouth at bedtime.      traZODone (DESYREL) 50 MG tablet Take 50 mg by mouth at bedtime.     No current facility-administered medications for this visit.   Allergies:  Patient has no known allergies.   Social History: The patient  reports that she has never smoked. She has never used smokeless tobacco. She reports that she does not drink alcohol and does not use drugs.   Family History: The  patient's family history includes Alcohol abuse in her father and mother; Anxiety disorder in her father and sister; Depression in her cousin, father, maternal aunt, and sister.   ROS:  Please see the history of present illness. Otherwise, complete review of systems is positive for none.  All other systems are reviewed and negative.   Physical Exam: VS:  LMP 12/05/2015 , BMI There is no height or weight on file to calculate BMI.  Wt Readings from Last 3 Encounters:  05/05/21 186 lb 4.6 oz (84.5 kg)  08/15/20 190 lb (86.2 kg)  08/13/20 190 lb (86.2 kg)    General: Patient appears comfortable at rest. HEENT: Conjunctiva and lids normal, oropharynx clear with moist mucosa. Neck: Supple, no elevated JVP or carotid bruits, no thyromegaly. Lungs:  Clear to auscultation, nonlabored breathing at rest. Cardiac: Regular rate and rhythm, no S3 or significant systolic murmur, no pericardial rub. Abdomen: Soft, nontender, no hepatomegaly, bowel sounds present, no guarding or rebound. Extremities: No pitting edema, distal pulses 2+. Skin: Warm and dry. Musculoskeletal: No kyphosis. Neuropsychiatric: Alert and oriented x3, affect grossly appropriate.  ECG:  None today  Recent Labwork: 05/06/2021: BUN 19; Creatinine, Ser 0.73; Hemoglobin 9.5; Platelets 171; Potassium 4.1; Sodium 136     Component Value Date/Time   CHOL 322 (H) 05/05/2021 0315   TRIG 851 (H) 05/05/2021 0315   HDL 29 (L) 05/05/2021 0315   CHOLHDL 11.1 05/05/2021 0315   VLDL UNABLE TO CALCULATE IF TRIGLYCERIDE OVER 400 mg/dL 05/05/2021 0315   LDLCALC UNABLE TO CALCULATE IF TRIGLYCERIDE OVER 400 mg/dL 05/05/2021 0315   LDLDIRECT 123.1 (H) 05/05/2021 0315    Other Studies Reviewed Today:  LHC 05/05/21: Previously placed RPDA stent (unknown type) is widely patent. Prox RCA lesion is 30% stenosed. Mid RCA lesion is 30% stenosed. Previously placed Prox LAD to Mid LAD stent (unknown type) is widely patent. 1st Mrg lesion is 80%  stenosed. Prox Cx to Mid Cx lesion is 70% stenosed. Mid Cx to Dist Cx lesion is 80% stenosed. 2nd RPL lesion is 30% stenosed.   Widely patent previously placed stents in the proximal to mid LAD and PDA of the right coronary artery.  The LAD is otherwise normal and the RCA has mild nonobstructive 30% proximal and mid smooth stenosis.   The left circumflex vessel is very  small caliber with improvement in prior stenoses from 95 and 90% to 70% and 80%.   Normal LV function with EF estimated 55 to 60% without focal segmental wall motion abnormality.  LVEDP 12 mmHg.   RECOMMENDATION: Continue DAPT with aspirin/Plavix.   Resumption of high potency statin therapy with atorvastatin 80 mg and  add Vascepa 2 capsules twice a day and fenofibrate with marked triglyceride elevation of 851.  We will increase medical therapy with isosorbide mononitrate to 60 mg and carvedilol recently increased to 25 mg twice a day.  Plan initial medical management unless recurrent symptomatology.  Diagnostic Dominance: Right        Echocardiogram 05/06/21: 1. Left ventricular ejection fraction, by estimation, is 60 to 65%. The  left ventricle has normal function. The left ventricle has no regional  wall motion abnormalities. There is moderate concentric left ventricular  hypertrophy. Left ventricular  diastolic parameters are consistent with Grade I diastolic dysfunction  (impaired relaxation).   2. Right ventricular systolic function is normal. The right ventricular  size is normal. Tricuspid regurgitation signal is inadequate for assessing  PA pressure.   3. The mitral valve is grossly normal. No evidence of mitral valve  regurgitation. No evidence of mitral stenosis.   4. The aortic valve is tricuspid. There is mild calcification of the  aortic valve. There is mild thickening of the aortic valve. Aortic valve  regurgitation is not visualized. Mild aortic valve sclerosis is present,  with no evidence of  aortic valve  stenosis.   5. The inferior vena cava is dilated in size with >50% respiratory  variability, suggesting right atrial pressure of 8 mmHg.   Comparison(s): A prior study was performed on 06/14/2019. No significant  change from prior study. Prior images reviewed side by side.   Echocardiogram 06/14/2019 1. The left ventricle has normal systolic function, with an ejection  fraction of 55-60%. The cavity size was normal. Left ventricular diastolic  parameters were normal.   2. The right ventricle has normal systolic function. The cavity was  normal. There is no increase in right ventricular wall thickness. Right  ventricular systolic pressure is normal with an estimated pressure of 10.7  mmHg.   3. The mitral valve is grossly normal.   4. The tricuspid valve is grossly normal.   5. The aortic valve is tricuspid.   6. The aorta is normal in size and structure.   7. The inferior vena cava was normal in size with <50% respiratory  variability.    Cardiac catheterization 05/24/2019 Prox LAD to Mid LAD lesion is 95% stenosed. Mid LAD lesion is 90% stenosed. Prox Cx lesion is 95% stenosed. Prox Cx to Mid Cx lesion is 90% stenosed. Prox RCA lesion is 30% stenosed. 2nd RPL lesion is 80% stenosed. RPDA-2 lesion is 80% stenosed. Previously placed RPDA-1 stent (unknown type) is widely patent. A drug-eluting stent was successfully placed using a STENT SYNERGY DES 2.25X28. Post intervention, there is a 0% residual stenosis. Post intervention, there is a 0% residual stenosis. There is mild left ventricular systolic dysfunction. LV end diastolic pressure is normal. The left ventricular ejection fraction is 50-55% by visual estimate.   Assessment and Plan:  1. CAD in native artery   2. Essential hypertension   3. Mixed hyperlipidemia     1. CAD in native artery History of STEMI in 2016 with DES to PDA x2.  May 25, 2019 had a cath for progressive angina with DES to LAD with  medical treatment for PDA/PL disease.  Patient states she has mild chest tightness during activity but it is quickly relieved when resting.  2. Essential hypertension Blood pressure elevated today at 188/90.  Recent blood pressure at ED [facility] on 02/13/2020 was 166/80.  Increase lisinopril to 20 mg daily.  Get a BMP in 2 weeks after increasing lisinopril dose.  Patient states her blood pressure cuff is broke.  Advised her to get a new blood pressure cuff to measure her pressures at home.  3. HLD Most recent lipid panel 05/08/2019 at PCP office showed a total cholesterol of 321, HDL of 30, triglycerides 572, LDL cholesterol was not able to be calculated due to triglyceride levels greater than 400.  Non-HDL cholesterol 291.  Get a fasting lipid panel with direct LDL and LFTs.  Continue atorvastatin high intensity 80 mg daily      Medication Adjustments/Labs and Tests Ordered: Current medicines are reviewed at length with the patient today.  Concerns regarding medicines are outlined above.   Disposition: Follow-up with   Signed, Levell July, NP 05/31/2021 8:14 PM    [facility] [place], [place], [place] [address] Phone: [phone]; Fax: [phone]

## 2021-06-01 ENCOUNTER — Ambulatory Visit: Payer: 59 | Admitting: Family Medicine

## 2021-06-23 ENCOUNTER — Other Ambulatory Visit: Payer: Self-pay | Admitting: Family Medicine

## 2021-06-29 NOTE — Progress Notes (Deleted)
Cardiology Office Note  Date: 06/29/2021   ID: Amy Snow, [name] 24-Mar-1966, MRN [id number]  PCP:  Wyatt Haste, NP  Cardiologist:  None Electrophysiologist:  None   Chief Complaint: Hospital follow up Chest pain / Cardiac catheterization.   History of Present Illness:  Amy Snow is a 55 y.o. female with a history of STEMI 2016 status post DES x2 PDA with medical treatment for residual disease.  HTN, HLD, GERD, DM, fibromyalgia, mild anemia, anxiety, angina.  May 15, 2019 she had an office visit concerning for progressive angina.  She was admitted May 25, 2019 for outpatient cath and had a DES to LAD with medical treatment for PDA/PL disease.  EF was 50 to 55% at cath and 55 to 60% by echo.  She called the office on 05/04/2021 complaining of intermittent chest pain responsive to SL NTG. Symptoms had started on 04/28/2021. HS troponin was 66 and subsequent values of 68-68. She had ST depression in lateral leads. She presented to [facility] ED and was admitted but ultimately transferred to [facility] and underwent cardiac catheterization. See report below. She had no intervention. Imdur was increased to 60 mg po daily. Vascepa was started with fenofibrate due to elevated triglycerides of 851. She was continuing aspirin, Plavix, atorvastatin, Imdur, carvedilol, and lisinopril..     Past Medical History:  Diagnosis Date   Anxiety and depression    CAD (coronary artery disease)    a. s/p STEMI 2016 s/p DES x 2 PDA with residual dz treated medically. b. [place] in 2020 with cath s/p DES to long high grade lesion in LAD, residual disease treated medically, EF 50%.   Diabetes mellitus, type II ([place])    Family history of adverse reaction to anesthesia    PONV   Fibromyalgia    GERD (gastroesophageal reflux disease)    Hyperlipidemia    Hypertension    Ischemic cardiomyopathy    a. EF <25% in 2016, improved to normal thereafter. b. EF 50% in 05/2019.   Myocardial infarct [facility])    august 2016     Past Surgical History:  Procedure Laterality Date   BIOPSY  08/15/2020   Procedure: BIOPSY;  Surgeon: Harvel Quale, MD;  [facility];  Service: Gastroenterology;;   CARDIAC CATHETERIZATION N/A 07/05/2015   Procedure: Left Heart Cath and Coronary Angiography;  Surgeon: Lorretta Harp, MD;  Location: [place] CV LAB;  Service: Cardiovascular;  Laterality: N/A;   CESAREAN SECTION     COLONOSCOPY WITH PROPOFOL N/A 08/15/2020   Procedure: COLONOSCOPY WITH PROPOFOL;  Surgeon: Harvel Quale, MD;  [facility];  Service: Gastroenterology;  Laterality: N/A;  1230   CORONARY STENT INTERVENTION N/A 05/24/2019   Procedure: CORONARY STENT INTERVENTION;  Surgeon: Lorretta Harp, MD;  Location: [place] CV LAB;  Service: Cardiovascular;  Laterality: N/A;   ESOPHAGEAL DILATION N/A 08/15/2020   Procedure: ESOPHAGEAL DILATION;  Surgeon: Harvel Quale, MD;  [facility];  Service: Gastroenterology;  Laterality: N/A;   ESOPHAGOGASTRODUODENOSCOPY (EGD) WITH PROPOFOL N/A 08/15/2020   Procedure: ESOPHAGOGASTRODUODENOSCOPY (EGD) WITH PROPOFOL;  Surgeon: Harvel Quale, MD;  [facility];  Service: Gastroenterology;  Laterality: N/A;   HERNIA REPAIR Left    x2 ventral and inguinal   LEFT HEART CATH AND CORONARY ANGIOGRAPHY N/A 05/24/2019   Procedure: LEFT HEART CATH AND CORONARY ANGIOGRAPHY;  Surgeon: Lorretta Harp, MD;  Location: [place] CV LAB;  Service: Cardiovascular;  Laterality:  N/A;   LEFT HEART CATH AND CORONARY ANGIOGRAPHY N/A 05/05/2021   Procedure: LEFT HEART CATH AND CORONARY ANGIOGRAPHY;  Surgeon: Troy Sine, MD;  Location: [place] CV LAB;  Service: Cardiovascular;  Laterality: N/A;   POLYPECTOMY  08/15/2020   Procedure: POLYPECTOMY;  Surgeon: Montez Morita, Quillian Quince, MD;  [facility];  Service: Gastroenterology;;   TONSILLECTOMY     TUBAL LIGATION      Current Outpatient  Medications  Medication Sig Dispense Refill   acetaminophen (TYLENOL) 650 MG CR tablet Take 650-1,300 mg by mouth every 8 (eight) hours as needed for pain.      amitriptyline (ELAVIL) 25 MG tablet Take 25 mg by mouth at bedtime.     APPLE CIDER VINEGAR PO Take 900 mg by mouth in the morning and at bedtime. 450 mg/tablet     aspirin 81 MG chewable tablet Chew 1 tablet (81 mg total) by mouth daily. 90 tablet 3   atorvastatin (LIPITOR) 80 MG tablet Take 1 tablet (80 mg total) by mouth daily. 90 tablet 3   carvedilol (COREG) 25 MG tablet Take 1 tablet (25 mg total) by mouth 2 (two) times daily. 180 tablet 3   clopidogrel (PLAVIX) 75 MG tablet Take 1 tablet (75 mg total) by mouth daily. 90 tablet 3   hydrochlorothiazide (MICROZIDE) 12.5 MG capsule TAKE 1 CAPSULE BY MOUTH ONCE DAILY-TAKE AN EXTRA CAPSULE DAILY AS NEEDED FOR WEIGHT GAIN OF 3 LBS IN ONE DAY OR 5 LBS IN ONE WEEK 90 capsule 0   insulin NPH-regular Human (70-30) 100 UNIT/ML injection Inject 80 Units into the skin 2 (two) times a day.      isosorbide mononitrate (IMDUR) 60 MG 24 hr tablet Take 1 tablet (60 mg total) by mouth daily. 90 tablet 3   lisinopril (ZESTRIL) 20 MG tablet Take 1 tablet by mouth once daily 30 tablet 0   nitroGLYCERIN (NITROSTAT) 0.4 MG SL tablet Place 1 tablet (0.4 mg total) under the tongue every 5 (five) minutes x 3 doses as needed for chest pain. 25 tablet 3   pantoprazole (PROTONIX) 40 MG tablet Take 1 tablet (40 mg total) by mouth daily. 90 tablet 3   sertraline (ZOLOFT) 100 MG tablet Take 150 mg by mouth at bedtime.      traZODone (DESYREL) 50 MG tablet Take 50 mg by mouth at bedtime.     No current facility-administered medications for this visit.   Allergies:  Patient has no known allergies.   Social History: The patient  reports that she has never smoked. She has never used smokeless tobacco. She reports that she does not drink alcohol and does not use drugs.   Family History: The patient's family history  includes Alcohol abuse in her father and mother; Anxiety disorder in her father and sister; Depression in her cousin, father, maternal aunt, and sister.   ROS:  Please see the history of present illness. Otherwise, complete review of systems is positive for none.  All other systems are reviewed and negative.   Physical Exam: VS:  LMP 12/05/2015 , BMI There is no height or weight on file to calculate BMI.  Wt Readings from Last 3 Encounters:  05/05/21 186 lb 4.6 oz (84.5 kg)  08/15/20 190 lb (86.2 kg)  08/13/20 190 lb (86.2 kg)    General: Patient appears comfortable at rest. HEENT: Conjunctiva and lids normal, oropharynx clear with moist mucosa. Neck: Supple, no elevated JVP or carotid bruits, no thyromegaly. Lungs: Clear to auscultation,  nonlabored breathing at rest. Cardiac: Regular rate and rhythm, no S3 or significant systolic murmur, no pericardial rub. Abdomen: Soft, nontender, no hepatomegaly, bowel sounds present, no guarding or rebound. Extremities: No pitting edema, distal pulses 2+. Skin: Warm and dry. Musculoskeletal: No kyphosis. Neuropsychiatric: Alert and oriented x3, affect grossly appropriate.  ECG:  None today  Recent Labwork: 05/06/2021: BUN 19; Creatinine, Ser 0.73; Hemoglobin 9.5; Platelets 171; Potassium 4.1; Sodium 136     Component Value Date/Time   CHOL 322 (H) 05/05/2021 0315   TRIG 851 (H) 05/05/2021 0315   HDL 29 (L) 05/05/2021 0315   CHOLHDL 11.1 05/05/2021 0315   VLDL UNABLE TO CALCULATE IF TRIGLYCERIDE OVER 400 mg/dL 05/05/2021 0315   LDLCALC UNABLE TO CALCULATE IF TRIGLYCERIDE OVER 400 mg/dL 05/05/2021 0315   LDLDIRECT 123.1 (H) 05/05/2021 0315    Other Studies Reviewed Today:  LHC 05/05/21: Previously placed RPDA stent (unknown type) is widely patent. Prox RCA lesion is 30% stenosed. Mid RCA lesion is 30% stenosed. Previously placed Prox LAD to Mid LAD stent (unknown type) is widely patent. 1st Mrg lesion is 80% stenosed. Prox Cx to Mid Cx  lesion is 70% stenosed. Mid Cx to Dist Cx lesion is 80% stenosed. 2nd RPL lesion is 30% stenosed.   Widely patent previously placed stents in the proximal to mid LAD and PDA of the right coronary artery.  The LAD is otherwise normal and the RCA has mild nonobstructive 30% proximal and mid smooth stenosis.   The left circumflex vessel is very  small caliber with improvement in prior stenoses from 95 and 90% to 70% and 80%.   Normal LV function with EF estimated 55 to 60% without focal segmental wall motion abnormality.  LVEDP 12 mmHg.   RECOMMENDATION: Continue DAPT with aspirin/Plavix.   Resumption of high potency statin therapy with atorvastatin 80 mg and  add Vascepa 2 capsules twice a day and fenofibrate with marked triglyceride elevation of 851.  We will increase medical therapy with isosorbide mononitrate to 60 mg and carvedilol recently increased to 25 mg twice a day.  Plan initial medical management unless recurrent symptomatology.  Diagnostic Dominance: Right        Echocardiogram 05/06/21: 1. Left ventricular ejection fraction, by estimation, is 60 to 65%. The  left ventricle has normal function. The left ventricle has no regional  wall motion abnormalities. There is moderate concentric left ventricular  hypertrophy. Left ventricular  diastolic parameters are consistent with Grade I diastolic dysfunction  (impaired relaxation).   2. Right ventricular systolic function is normal. The right ventricular  size is normal. Tricuspid regurgitation signal is inadequate for assessing  PA pressure.   3. The mitral valve is grossly normal. No evidence of mitral valve  regurgitation. No evidence of mitral stenosis.   4. The aortic valve is tricuspid. There is mild calcification of the  aortic valve. There is mild thickening of the aortic valve. Aortic valve  regurgitation is not visualized. Mild aortic valve sclerosis is present,  with no evidence of aortic valve  stenosis.   5. The  inferior vena cava is dilated in size with >50% respiratory  variability, suggesting right atrial pressure of 8 mmHg.   Comparison(s): A prior study was performed on 06/14/2019. No significant  change from prior study. Prior images reviewed side by side.   Echocardiogram 06/14/2019 1. The left ventricle has normal systolic function, with an ejection  fraction of 55-60%. The cavity size was normal. Left ventricular diastolic  parameters were  normal.   2. The right ventricle has normal systolic function. The cavity was  normal. There is no increase in right ventricular wall thickness. Right  ventricular systolic pressure is normal with an estimated pressure of 10.7  mmHg.   3. The mitral valve is grossly normal.   4. The tricuspid valve is grossly normal.   5. The aortic valve is tricuspid.   6. The aorta is normal in size and structure.   7. The inferior vena cava was normal in size with <50% respiratory  variability.    Cardiac catheterization 05/24/2019 Prox LAD to Mid LAD lesion is 95% stenosed. Mid LAD lesion is 90% stenosed. Prox Cx lesion is 95% stenosed. Prox Cx to Mid Cx lesion is 90% stenosed. Prox RCA lesion is 30% stenosed. 2nd RPL lesion is 80% stenosed. RPDA-2 lesion is 80% stenosed. Previously placed RPDA-1 stent (unknown type) is widely patent. A drug-eluting stent was successfully placed using a STENT SYNERGY DES 2.25X28. Post intervention, there is a 0% residual stenosis. Post intervention, there is a 0% residual stenosis. There is mild left ventricular systolic dysfunction. LV end diastolic pressure is normal. The left ventricular ejection fraction is 50-55% by visual estimate.   Assessment and Plan:  1. CAD in native artery   2. Essential hypertension   3. Mixed hyperlipidemia      1. CAD in native artery History of STEMI in 2016 with DES to PDA x2.  May 25, 2019 had a cath for progressive angina with DES to LAD with medical treatment for PDA/PL disease.   Patient states she has mild chest tightness during activity but it is quickly relieved when resting.  2. Essential hypertension Blood pressure elevated today at 188/90.  Recent blood pressure at ED visit at [facility] on 02/13/2020 was 166/80.  Increase lisinopril to 20 mg daily.  Get a BMP in 2 weeks after increasing lisinopril dose.  Patient states her blood pressure cuff is broke.  Advised her to get a new blood pressure cuff to measure her pressures at home.  3. HLD Most recent lipid panel 05/08/2019 at PCP office showed a total cholesterol of 321, HDL of 30, triglycerides 572, LDL cholesterol was not able to be calculated due to triglyceride levels greater than 400.  Non-HDL cholesterol 291.  Get a fasting lipid panel with direct LDL and LFTs.  Continue atorvastatin high intensity 80 mg daily      Medication Adjustments/Labs and Tests Ordered: Current medicines are reviewed at length with the patient today.  Concerns regarding medicines are outlined above.   Disposition: Follow-up with   Signed, Levell July, NP 06/29/2021 9:21 PM    [facility] [place], [place], [place] [address] Phone: [phone]; Fax: [phone]

## 2021-06-30 ENCOUNTER — Ambulatory Visit: Payer: 59 | Admitting: Family Medicine

## 2021-06-30 DIAGNOSIS — E782 Mixed hyperlipidemia: Secondary | ICD-10-CM

## 2021-06-30 DIAGNOSIS — I2510 Atherosclerotic heart disease of native coronary artery without angina pectoris: Secondary | ICD-10-CM

## 2021-06-30 DIAGNOSIS — I10 Essential (primary) hypertension: Secondary | ICD-10-CM

## 2021-07-09 ENCOUNTER — Telehealth: Payer: Self-pay | Admitting: Internal Medicine

## 2021-07-09 ENCOUNTER — Encounter: Payer: Self-pay | Admitting: Internal Medicine

## 2021-07-09 ENCOUNTER — Telehealth (INDEPENDENT_AMBULATORY_CARE_PROVIDER_SITE_OTHER): Payer: 59 | Admitting: Internal Medicine

## 2021-07-09 VITALS — BP 110/60 | Wt 176.0 lb

## 2021-07-09 DIAGNOSIS — E119 Type 2 diabetes mellitus without complications: Secondary | ICD-10-CM | POA: Diagnosis not present

## 2021-07-09 DIAGNOSIS — E669 Obesity, unspecified: Secondary | ICD-10-CM

## 2021-07-09 DIAGNOSIS — E782 Mixed hyperlipidemia: Secondary | ICD-10-CM

## 2021-07-09 DIAGNOSIS — I10 Essential (primary) hypertension: Secondary | ICD-10-CM | POA: Diagnosis not present

## 2021-07-09 DIAGNOSIS — I25110 Atherosclerotic heart disease of native coronary artery with unstable angina pectoris: Secondary | ICD-10-CM | POA: Diagnosis not present

## 2021-07-09 DIAGNOSIS — I2510 Atherosclerotic heart disease of native coronary artery without angina pectoris: Secondary | ICD-10-CM

## 2021-07-09 NOTE — Telephone Encounter (Signed)
Patent would like to know if she can go to the hospital in [place] for lab work, because she does not drive.

## 2021-07-09 NOTE — Progress Notes (Signed)
Virtual Visit via Video Note   This visit type was conducted due to national recommendations for restrictions regarding the COVID-19 Pandemic (e.g. social distancing) in an effort to limit this patient's exposure and mitigate transmission in our community.  Due to her co-morbid illnesses, this patient is at least at moderate risk for complications without adequate follow up.  This format is felt to be most appropriate for this patient at this time.  All issues noted in this document were discussed and addressed.  A limited physical exam was performed with this format.  Please refer to the patient's chart for her consent to telehealth for [facility].      Date:  07/09/2021   ID:  Amy Snow, DOB Oct 20, 1966, MRN [id number] The patient was identified using 2 identifiers.  Evaluation Performed:  New Patient Evaluation  Patient Location:  [address] [place] [address]  Provider location:   [address], [place] [address] [place],  [address]  PCP:  Wyatt Haste, NP  Cardiologist:  None Electrophysiologist:  None   Chief Complaint: Manage dyslipidemia  History of Present Illness:    Amy Snow is a 55 y.o. female who presents via audio/video conferencing for a telehealth visit today.  This is a 55 year old female with a past medical history significant for coronary artery disease with STEMI in 2016 and 2 stents to the PDA as well as cath in 2020 requiring a drug-eluting stent to the LAD.  She has had a reduced LVEF in the past.  Hypertension, dyslipidemia and insulin-dependent diabetes.  She presented the office with intermittent chest pain in June 2022 and was advised to go to the ER.  Ultimately she underwent repeat catheterization which showed patent stents but some progression of her coronary disease.  No clear targets for intervention.  She had run out of her atorvastatin.  Her medications were then adjusted and Vascepa and fenofibrate were added because of  high triglycerides.  Labs demonstrated total cholesterol 322, triglycerides 851 (as high as 1300 in the past) HDL 29, LDL was not calculated.  She continues to struggle with diabetes and her last A1c was 9.8.  The patient does not have symptoms concerning for COVID-19 infection (fever, chills, cough, or new SHORTNESS OF BREATH).    Prior CV studies:   The following studies were reviewed today:  Chart reviewed  PMHx:  Past Medical History:  Diagnosis Date   Anxiety and depression    CAD (coronary artery disease)    a. s/p STEMI 2016 s/p DES x 2 PDA with residual dz treated medically. b. [place] in 2020 with cath s/p DES to long high grade lesion in LAD, residual disease treated medically, EF 50%.   Diabetes mellitus, type II ([place])    Family history of adverse reaction to anesthesia    PONV   Fibromyalgia    GERD (gastroesophageal reflux disease)    Hyperlipidemia    Hypertension    Ischemic cardiomyopathy    a. EF <25% in 2016, improved to normal thereafter. b. EF 50% in 05/2019.   Myocardial infarct [facility])    august 2016    Past Surgical History:  Procedure Laterality Date   BIOPSY  08/15/2020   Procedure: BIOPSY;  Surgeon: Harvel Quale, MD;  [facility];  Service: Gastroenterology;;   CARDIAC CATHETERIZATION N/A 07/05/2015   Procedure: Left Heart Cath and Coronary Angiography;  Surgeon: Lorretta Harp, MD;  Location: [place] CV LAB;  Service:  Cardiovascular;  Laterality: N/A;   CESAREAN SECTION     COLONOSCOPY WITH PROPOFOL N/A 08/15/2020   Procedure: COLONOSCOPY WITH PROPOFOL;  Surgeon: Harvel Quale, MD;  [facility];  Service: Gastroenterology;  Laterality: N/A;  1230   CORONARY STENT INTERVENTION N/A 05/24/2019   Procedure: CORONARY STENT INTERVENTION;  Surgeon: Lorretta Harp, MD;  Location: [place] CV LAB;  Service: Cardiovascular;  Laterality: N/A;   ESOPHAGEAL DILATION N/A 08/15/2020   Procedure: ESOPHAGEAL DILATION;   Surgeon: Harvel Quale, MD;  [facility];  Service: Gastroenterology;  Laterality: N/A;   ESOPHAGOGASTRODUODENOSCOPY (EGD) WITH PROPOFOL N/A 08/15/2020   Procedure: ESOPHAGOGASTRODUODENOSCOPY (EGD) WITH PROPOFOL;  Surgeon: Harvel Quale, MD;  [facility];  Service: Gastroenterology;  Laterality: N/A;   HERNIA REPAIR Left    x2 ventral and inguinal   LEFT HEART CATH AND CORONARY ANGIOGRAPHY N/A 05/24/2019   Procedure: LEFT HEART CATH AND CORONARY ANGIOGRAPHY;  Surgeon: Lorretta Harp, MD;  Location: [place] CV LAB;  Service: Cardiovascular;  Laterality: N/A;   LEFT HEART CATH AND CORONARY ANGIOGRAPHY N/A 05/05/2021   Procedure: LEFT HEART CATH AND CORONARY ANGIOGRAPHY;  Surgeon: Troy Sine, MD;  Location: [place] CV LAB;  Service: Cardiovascular;  Laterality: N/A;   POLYPECTOMY  08/15/2020   Procedure: POLYPECTOMY;  Surgeon: Harvel Quale, MD;  [facility];  Service: Gastroenterology;;   TONSILLECTOMY     TUBAL LIGATION      FAMHx:  Family History  Problem Relation Age of Onset   Alcohol abuse Mother    Anxiety disorder Father    Depression Father    Alcohol abuse Father    Anxiety disorder Sister    Depression Sister    Depression Maternal Aunt    Depression Cousin     SOCHx:   reports that she has never smoked. She has never used smokeless tobacco. She reports that she does not drink alcohol and does not use drugs.  ALLERGIES:  No Known Allergies  MEDS:  Current Meds  Medication Sig   acetaminophen (TYLENOL) 650 MG CR tablet Take 650-1,300 mg by mouth every 8 (eight) hours as needed for pain.    albuterol (VENTOLIN HFA) 108 (90 Base) MCG/ACT inhaler every 4 (four) hours as needed.   amitriptyline (ELAVIL) 25 MG tablet Take 25 mg by mouth at bedtime.   amitriptyline (ELAVIL) 50 MG tablet Take 50 mg by mouth at bedtime.   APPLE CIDER VINEGAR PO Take 900 mg by mouth in the morning and at  bedtime. 450 mg/tablet   aspirin 81 MG chewable tablet Chew 1 tablet (81 mg total) by mouth daily.   atorvastatin (LIPITOR) 80 MG tablet Take 1 tablet (80 mg total) by mouth daily.   carvedilol (COREG) 25 MG tablet Take 1 tablet (25 mg total) by mouth 2 (two) times daily.   clopidogrel (PLAVIX) 75 MG tablet Take 1 tablet (75 mg total) by mouth daily.   hydrochlorothiazide (MICROZIDE) 12.5 MG capsule TAKE 1 CAPSULE BY MOUTH ONCE DAILY-TAKE AN EXTRA CAPSULE DAILY AS NEEDED FOR WEIGHT GAIN OF 3 LBS IN ONE DAY OR 5 LBS IN ONE WEEK   ibuprofen (ADVIL) 800 MG tablet Take 800 mg by mouth 3 (three) times daily as needed.   insulin NPH-regular Human (70-30) 100 UNIT/ML injection Inject 80 Units into the skin 2 (two) times a day.    isosorbide mononitrate (IMDUR) 60 MG 24 hr tablet Take 1 tablet (60 mg total)  by mouth daily.   lisinopril (ZESTRIL) 20 MG tablet Take 1 tablet by mouth once daily   meclizine (ANTIVERT) 12.5 MG tablet Take 12.5 mg by mouth every 6 (six) hours as needed.   metFORMIN (GLUCOPHAGE) 500 MG tablet Take 500 mg by mouth 2 (two) times daily.   nitroGLYCERIN (NITROSTAT) 0.4 MG SL tablet Place 1 tablet (0.4 mg total) under the tongue every 5 (five) minutes x 3 doses as needed for chest pain.   ondansetron (ZOFRAN) 4 MG tablet Take 4 mg by mouth every 8 (eight) hours as needed.   pantoprazole (PROTONIX) 40 MG tablet Take 1 tablet (40 mg total) by mouth daily.   sertraline (ZOLOFT) 100 MG tablet Take 150 mg by mouth at bedtime.    topiramate (TOPAMAX) 50 MG tablet Take 50 mg by mouth 2 (two) times daily.   traZODone (DESYREL) 50 MG tablet Take 50 mg by mouth at bedtime.     ROS: Pertinent items noted in HPI and remainder of comprehensive ROS otherwise negative.  Labs/Other Tests and Data Reviewed:    Recent Labs: 05/06/2021: BUN 19; Creatinine, Ser 0.73; Hemoglobin 9.5; Platelets 171; Potassium 4.1; Sodium 136   Recent Lipid Panel Lab Results  Component Value Date/Time   CHOL 322  (H) 05/05/2021 03:15 AM   TRIG 851 (H) 05/05/2021 03:15 AM   HDL 29 (L) 05/05/2021 03:15 AM   CHOLHDL 11.1 05/05/2021 03:15 AM   LDLCALC UNABLE TO CALCULATE IF TRIGLYCERIDE OVER 400 mg/dL 05/05/2021 03:15 AM   LDLDIRECT 123.1 (H) 05/05/2021 03:15 AM    Wt Readings from Last 3 Encounters:  07/09/21 176 lb (79.8 kg)  05/05/21 186 lb 4.6 oz (84.5 kg)  08/15/20 190 lb (86.2 kg)     Exam:    Vital Signs:  BP 110/60   Wt 176 lb (79.8 kg)   LMP 12/05/2015   BMI 35.55 kg/m    General appearance: alert, no distress, and moderately obese Lungs: No visual respiratory difficulty Abdomen: Currently obese Extremities: extremities normal, atraumatic, no cyanosis or edema Skin: Skin color, texture, turgor normal. No rashes or lesions Neurologic: Grossly normal Psych: Pleasant  ASSESSMENT & PLAN:    CAD with multiple prior PCI's and recent unstable angina Insulin-dependent diabetes Mixed dyslipidemia with high triglycerides Hypertension Moderate obesity  Ms. Gangi has a history of coronary artery disease with recurrent stenoses and prior STEMI status post multiple PCI's.  She has insulin-dependent diabetes which is not well controlled demonstrating recent A1c of 9.8.  She ran out of her medications and had very high cholesterol particularly high triglycerides.  She is now on atorvastatin 80 mg, Vascepa 2 g twice daily and fenofibrate daily.  She reports compliance with this.  As she has been on this medicine regimen for about 2 months, would like to recheck her lipids again fasting.  Further adjustments can be made based on this.  Plan follow-up with me in about 3 to 4 months.  COVID-19 Education: The signs and symptoms of COVID-19 were discussed with the patient and how to seek care for testing (follow up with PCP or arrange E-visit).  The importance of social distancing was discussed today.  Patient Risk:   After full review of this patients clinical status, I feel that they are at  least moderate risk at this time.  Time:   Today, I have spent 25 minutes with the patient with telehealth technology discussing dyslipidemia.     Medication Adjustments/Labs and Tests Ordered: Current medicines are reviewed at  length with the patient today.  Concerns regarding medicines are outlined above.   Tests Ordered: No orders of the defined types were placed in this encounter.   Medication Changes: No orders of the defined types were placed in this encounter.   Disposition:  in 3 month(s)  Pixie Casino, MD, [facility], [place] Director of the Advanced Lipid Disorders &  Cardiovascular Risk Reduction Clinic Diplomate of the American Board of Clinical Lipidology Attending Cardiologist  Direct Dial: [phone]  Fax: [phone]  Website:  [email or web address]  Pixie Casino, MD  07/09/2021 8:35 AM

## 2021-07-09 NOTE — Patient Instructions (Signed)
Medication Instructions:  Your physician recommends that you continue on your current medications as directed. Please refer to the Current Medication list given to you today.  Any changes will be dependent on fasting lab results  *If you need a refill on your cardiac medications before your next appointment, please call your pharmacy*   Lab Work: FASTING lab work in about 2 weeks - NMR lipoprofile, Apolipoprotein B, LP(a)  If you have labs (blood work) drawn today and your tests are completely normal, you will receive your results only by: [place] (if you have MyChart) OR A paper copy in the mail If you have any lab test that is abnormal or we need to change your treatment, we will call you to review the results.   Testing/Procedures: NONE   Follow-Up: At [facility], you and your health needs are our priority.  As part of our continuing mission to provide you with exceptional heart care, we have created designated Provider Care Teams.  These Care Teams include your primary Cardiologist (physician) and Advanced Practice Providers (APPs -  Physician Assistants and Nurse Practitioners) who all work together to provide you with the care you need, when you need it.  We recommend signing up for the patient portal called "MyChart".  Sign up information is provided on this After Visit Summary.  MyChart is used to connect with patients for Virtual Visits (Telemedicine).  Patients are able to view lab/test results, encounter notes, upcoming appointments, etc.  Non-urgent messages can be sent to your provider as well.   To learn more about what you can do with MyChart, go to [email or web address].    Your next appointment:   4 month(s) - lipid clinic  The format for your next appointment:   In Person or Virtual  Provider:   K. Mali Hilty, MD   Other Instructions

## 2021-07-09 NOTE — Telephone Encounter (Signed)
Returned call to patient who states she was calling in to see when she needs to return for her lab work and if she can have them done in [place].   Advised patient that per AVS from today's virtual appointment with Dr. Debara Pickett it looks like she needs to have her fasting labs done in 2 weeks. Gave patient address to [place] in [place]. Advised patient to call back to office with any issues, questions, or concerns. Patient verbalized understanding.

## 2021-08-11 ENCOUNTER — Other Ambulatory Visit (HOSPITAL_COMMUNITY): Payer: Self-pay

## 2021-08-12 ENCOUNTER — Other Ambulatory Visit (HOSPITAL_COMMUNITY): Payer: Self-pay

## 2021-11-03 ENCOUNTER — Telehealth: Payer: 59 | Admitting: Internal Medicine

## 2021-11-25 ENCOUNTER — Telehealth: Payer: Self-pay | Admitting: *Deleted

## 2021-11-25 NOTE — Telephone Encounter (Signed)
Spoke with Ms. Amy Snow about core research. I have sent her a copy of the consent, My contact information,  reviewed core research information, and answered her questions. She would like to speak with her sister before scheduling an appointment. Informed her I will call her back at a later time.

## 2021-11-26 ENCOUNTER — Telehealth: Payer: Self-pay | Admitting: *Deleted

## 2021-11-26 NOTE — Telephone Encounter (Signed)
Spoke with Amy Snow, states she would like to be seen for a run in visit to see if she will qualify to become a research subject for CORE.  Appointment scheduled.

## 2021-12-02 ENCOUNTER — Telehealth: Payer: Self-pay | Admitting: *Deleted

## 2021-12-02 NOTE — Telephone Encounter (Signed)
Spoke with [name] to see if she had any further questions. States she has no questions and plans to be her Jan 30 at 0800 with her sister.

## 2021-12-21 ENCOUNTER — Other Ambulatory Visit: Payer: Self-pay

## 2021-12-21 ENCOUNTER — Encounter: Payer: Medicaid Other | Admitting: *Deleted

## 2021-12-21 VITALS — BP 137/64 | HR 75 | Temp 97.2°F | Resp 16 | Ht 59.0 in | Wt 163.6 lb

## 2021-12-21 DIAGNOSIS — Z006 Encounter for examination for normal comparison and control in clinical research program: Secondary | ICD-10-CM

## 2021-12-21 NOTE — Research (Signed)
° ° ° ° °  Screening Run-In Clinic Visit    Date of Visit: 30 Jan., 2023   Subject #: [id number]      During this visit the following activities were completed:  _0 Reading, Signing and Understanding the informed Consent   _1 Review Inclusion/Exclusion Criteria  _2 Vital Signs, Height, & Weight:  - Blood pressure: (Subject sat supine for at least 5 minutes before blood pressure was performed) - Heart rate:75 - Temperature:97.2 - Respiratory Rate:16 - Oxygen Saturation:100% - Weight:163.6 lbs - Height:4 ft 11 in  _3 Physical Exam done by PI or Sub-I  _4 Review Subjects Medical History & Concomitant Medications  _5 Review Any Adverse Events/ Serious Adverse Events  _6 Review of any ER Visits, Hospitalizations and Inpatient Days  _7 12-Lead ECG (Subject sat supine for at least 5 minutes before this was performed)  *All ECG's completed will be available in subjects binder   _8  Subject fasting   _9  Blood and Urine specimens collected per protocol   _10 Genetic Testing Completed (Only for patients with suspected FCS)  _11 Extended Urinalysis/Pregnancy Test (if woman of childbearing age)  _12 Diet/Lifestyle/Alcohol Counseling with Subject  _13 FCS Symptoms 2 Week Recall  _14 Education/teaching subject on importance of completing the daily diary   _15 Education on the importance of complying with contraception precautions during study with subject agreement  Core Consent   Subject came into the Research clinic today (Jan. 30, 2023) for Screening run in CORE research study consent signed before any assessment completed. Medication list updated. Subject reports she Does not take Elavil 25 mg at bedtime, but does take Elavil 50 mg at bedtime. No complaints of pain. No c/o abd. Pain, diarrhea, Nausea, or vomiting. Reported no recent Dr visit, urgent care visit, or Ed visit.   Subject Name: Amy Snow  Subject met inclusion and exclusion criteria.  The informed consent form,  study requirements and expectations were reviewed with the subject and questions and concerns were addressed prior to the signing of the consent form.  The subject verbalized understanding of the trial requirements.  The subject agreed to participate in the Core  trial and signed the informed consent at [place] on 21 Dec 2021.  The informed consent was obtained prior to performance of any protocol-specific procedures for the subject.  A copy of the signed informed consent was given to the subject and a copy was placed in the subject's medical record.   Radwan Cowley Ward  Protocol number 1.2 Consent version  2

## 2021-12-23 NOTE — Research (Addendum)
CORE Abnormal Lab report Dec 21, 2021 For Amy Snow  Chemistry:  Glucose 127 mg/dL                     [] Clinically Significant  [x] Not Clinically Significant Total Protein 8.3 g/dL                  [] Clinically Significant  [x] Not Clinically Significant Magnesium 1.7 mg/dL                 [] Clinically Significant  [x] Not Clinically Significant ALT/SGOT 3 U/L                         [] Clinically Significant  [x] Not Clinically Significant  AST/SGOT 10 U/L                       [] Clinically Significant  [] Not Clinically Significant Hs-C-Reactive Protein 9.7mg /L   [] Clinically Significant  [x] Not Clinically Significant Hemoglobin A1c 9.9%                 [x] Clinically Significant  [] Not Clinically Significant   LIPIDS: Triglyceride 268 mg/dL                [] Clinically Significant  [x] Not Clinically Significant HDL- Cholesterol (ptt) 26 mg/dL [] Clinically Significant  [x] Not Clinically Significant Apolipoprotein Al 113 mg/dL       [] Clinically Significant  [x] Not Clinically Significant Apolipoprotein B 109.00 mg/dL    [] Clinically Significant  [x] Not Clinically Significant  Hematology:  Hemoglobin 11.3 g/dL                    [] Clinically Significant  [x] Not Clinically Significant MCV   75.0 fl                                  [] Clinically Significant  [x] Not Clinically Significant MCH   24 pg                                    [] Clinically Significant  [x] Not Clinically Significant RDW 19.3%                                    [] Clinically Significant  [x] Not Clinically Significant   Urinalysis: Protein 30  mg/dL                                [] Clinically Significant  [x] Not Clinically Significant Leukocyte Esterase 500                      [] Clinically Significant  [x] Not Clinically Significant Urinary White Blood Cells  50-75 per HPF   [] Clinically Significant  [x] Not Clinically Significant  Bacteria 1+ per HPF                          [] Clinically  Significant  [x] Not Clinically Significant Squamous Epithelial Cells 3-5 per HPF   [] Clinically Significant  [x] Not Clinically Significant  Mucous 1+ per HPF                           []   Clinically Significant  [x] Not Clinically Significant   Urine Chemistry:  Urine Albumin 5.26 mg/dL                 [] Clinically Significant  [x] Not Clinically Significant     Any further action needed to be taken per the PI?  A1c 9.9% is likely disqualifying. Trigs only 268, which is not bad. Urinalysis appears dirty (not a "clean" catch) - would check to see if she has an urinary symptoms and if so, direct her to her PCP for repeat urine testing.  Pixie Casino, MD, [facility], [place] Director of the Advanced Lipid Disorders &  Cardiovascular Risk Reduction Clinic Diplomate of the American Board of Clinical Lipidology Attending Cardiologist  Direct Dial: [phone]   Fax: [phone]  Website:  [email or web address]

## 2021-12-23 NOTE — Research (Signed)
Please review labs. 

## 2021-12-24 ENCOUNTER — Encounter: Payer: Self-pay | Admitting: *Deleted

## 2021-12-24 DIAGNOSIS — Z006 Encounter for examination for normal comparison and control in clinical research program: Secondary | ICD-10-CM

## 2021-12-24 NOTE — Research (Signed)
Spoke with Ms. Dieujuste about lab results from her Core run in visit.  Asked her if she is having symptoms of a UTI. She states that she is. Encouraged her to call her PCP.  Also informed her that she will not be able to continue in the study due to TG are 268 and A1c 9.9. She is a screen fail. Voices understanding.

## 2021-12-29 ENCOUNTER — Other Ambulatory Visit (HOSPITAL_COMMUNITY): Payer: Self-pay | Admitting: Family Medicine

## 2021-12-29 DIAGNOSIS — R42 Dizziness and giddiness: Secondary | ICD-10-CM

## 2022-01-11 ENCOUNTER — Other Ambulatory Visit: Payer: Self-pay | Admitting: Family Medicine

## 2022-01-11 ENCOUNTER — Other Ambulatory Visit (HOSPITAL_COMMUNITY): Payer: Self-pay | Admitting: Family Medicine

## 2022-01-11 DIAGNOSIS — M25511 Pain in right shoulder: Secondary | ICD-10-CM

## 2022-01-12 ENCOUNTER — Ambulatory Visit (HOSPITAL_COMMUNITY): Admission: RE | Admit: 2022-01-12 | Payer: 59 | Source: Ambulatory Visit

## 2022-01-12 ENCOUNTER — Encounter (HOSPITAL_COMMUNITY): Payer: Self-pay

## 2022-01-27 ENCOUNTER — Other Ambulatory Visit: Payer: Self-pay

## 2022-01-27 ENCOUNTER — Ambulatory Visit (HOSPITAL_COMMUNITY)
Admission: RE | Admit: 2022-01-27 | Discharge: 2022-01-27 | Disposition: A | Discharge status: Home / Self Care | Payer: 59 | Source: Ambulatory Visit | Attending: Family Medicine | Admitting: Family Medicine

## 2022-01-27 DIAGNOSIS — R42 Dizziness and giddiness: Secondary | ICD-10-CM | POA: Diagnosis present

## 2022-01-27 DIAGNOSIS — M25511 Pain in right shoulder: Secondary | ICD-10-CM | POA: Insufficient documentation

## 2022-01-29 NOTE — Research (Addendum)
Aarvi Stotts Screening run in  21-Dec-2021         Review of medications Pt states she takes tylenol as needed for pain started 07-05-15 Albuterol inhaler as needed for shortness of breath started 01-17-21 Elavil for depression started 04-28-21 Apple cider vinegar  prn for wt loss started 08-11-20 Aspirin to reduce risk of heart attack  and stroke started 05-15-19 Lipitor for high cholesterol started on 02-04-21 Coreg for HTN started 06-17-20 Plavix for blood thinner started 03-28-20 Microzide for fluid and wt gain started 11-25-20 Ibuprofen prn pain started 05-26-21 Insulin 70-30 for type 2 diabetes started 07-09-15 Imdur for chest pain started 02-18-20 Lisinopril for HTN started 07-05-15 Meclizine for dizziness started 05-26-21 Metformin for type 2 diabetes started 07-05-15 Nitroglycerin as needed for chest pain started 08-11-20 Zofran as needed for nausea started 05-26-21 Protonix for reflux started 07-06-15 Zoloft for depression started 01-09-20 Topamax for fibromyalgia started 10-10-13 Trazodone for depression started on 10-10-13

## 2022-02-18 ENCOUNTER — Other Ambulatory Visit: Payer: Self-pay | Admitting: *Deleted

## 2022-02-18 MED ORDER — LISINOPRIL 20 MG PO TABS: 20.0000 mg | ORAL_TABLET | Freq: Every day | ORAL | 0 refills | Status: DC

## 2022-03-11 ENCOUNTER — Ambulatory Visit: Payer: 59 | Admitting: Neurology

## 2022-03-11 ENCOUNTER — Encounter: Payer: Self-pay | Admitting: Neurology

## 2022-03-11 VITALS — Ht 59.0 in | Wt 165.0 lb

## 2022-03-11 DIAGNOSIS — R42 Dizziness and giddiness: Secondary | ICD-10-CM

## 2022-03-11 NOTE — Progress Notes (Signed)
? ?[facility] ? ?PATIENT: Amy Snow ?DOB: September 07, 1966 ? ?REQUESTING CLINICIAN: Bucio, [place] Dragon, FNP ?HISTORY FROM: Patient and sister  ?REASON FOR VISIT: Dizziness  ? ? ?HISTORICAL ? ?CHIEF COMPLAINT:  ?Chief Complaint  ?Patient presents with  ? New Patient (Initial Visit)  ?  Rm 12. Accompanied by Juliann Pulse. ?NP/paper/Elsa Bucio, NP [facility]/Worsening dizzy spells.  ? ? ?HISTORY OF PRESENT ILLNESS:  ?This is a 56 year old woman with PMHx of Hypertension, HDL, DMII, and fibromyalgia who is presenting with complaints of dizziness since last July which is getting worse and now constant since November 2022. Patient described the dizziness as spinning sensation with nausea, no vomiting and falls are also present, denies any injury. She was prescribed Meclizine but reports that it has not been helpful. She reports hearing loss and tinnitus in the right ear. She had a recent brain MRI which was negative for any acute intracranial abnormalities.  ?Off note, patient has a history of hypertension and she is on 4 anti hypertensive medications.  ? ? ?OTHER MEDICAL CONDITIONS: DMII, CAD, hyperlipidemia, Fybromylagia  ? ? ?REVIEW OF SYSTEMS: Full 14 system review of systems performed and negative with exception of: as noted in the HPI ? ?ALLERGIES: ?No Known Allergies ? ?HOME MEDICATIONS: ?Outpatient Medications Prior to Visit  ?Medication Sig Dispense Refill  ? acetaminophen (TYLENOL) 650 MG CR tablet Take 650-1,300 mg by mouth every 8 (eight) hours as needed for pain.     ? albuterol (VENTOLIN HFA) 108 (90 Base) MCG/ACT inhaler every 4 (four) hours as needed.    ? amitriptyline (ELAVIL) 50 MG tablet Take 50 mg by mouth at bedtime.    ? APPLE CIDER VINEGAR PO Take 900 mg by mouth in the morning and at bedtime. 450 mg/tablet    ? aspirin 81 MG chewable tablet Chew 1 tablet (81 mg total) by mouth daily. 90 tablet 3  ? atorvastatin (LIPITOR) 80 MG tablet Take 1 tablet (80 mg total) by mouth  daily. 90 tablet 3  ? carvedilol (COREG) 25 MG tablet Take 1 tablet (25 mg total) by mouth 2 (two) times daily. 180 tablet 3  ? clopidogrel (PLAVIX) 75 MG tablet Take 1 tablet (75 mg total) by mouth daily. 90 tablet 3  ? hydrochlorothiazide (MICROZIDE) 12.5 MG capsule TAKE 1 CAPSULE BY MOUTH ONCE DAILY-TAKE AN EXTRA CAPSULE DAILY AS NEEDED FOR WEIGHT GAIN OF 3 LBS IN ONE DAY OR 5 LBS IN ONE WEEK 90 capsule 0  ? ibuprofen (ADVIL) 800 MG tablet Take 800 mg by mouth 3 (three) times daily as needed.    ? insulin NPH-regular Human (70-30) 100 UNIT/ML injection Inject 80 Units into the skin 2 (two) times a day.     ? isosorbide mononitrate (IMDUR) 60 MG 24 hr tablet Take 1 tablet (60 mg total) by mouth daily. 90 tablet 3  ? lisinopril (ZESTRIL) 20 MG tablet Take 1 tablet (20 mg total) by mouth daily. 30 tablet 0  ? meclizine (ANTIVERT) 12.5 MG tablet Take 12.5 mg by mouth every 6 (six) hours as needed.    ? metFORMIN (GLUCOPHAGE) 500 MG tablet Take 500 mg by mouth 2 (two) times daily.    ? nitroGLYCERIN (NITROSTAT) 0.4 MG SL tablet Place 1 tablet (0.4 mg total) under the tongue every 5 (five) minutes x 3 doses as needed for chest pain. 25 tablet 3  ? ondansetron (ZOFRAN) 4 MG tablet Take 4 mg by mouth every 8 (eight) hours as needed.    ?  pantoprazole (PROTONIX) 40 MG tablet Take 1 tablet (40 mg total) by mouth daily. 90 tablet 3  ? sertraline (ZOLOFT) 100 MG tablet Take 150 mg by mouth at bedtime.     ? topiramate (TOPAMAX) 50 MG tablet Take 50 mg by mouth 2 (two) times daily.    ? traZODone (DESYREL) 50 MG tablet Take 50 mg by mouth at bedtime.    ? amitriptyline (ELAVIL) 25 MG tablet Take 25 mg by mouth at bedtime. (Patient not taking: Reported on 12/21/2021)    ? ?No facility-administered medications prior to visit.  ? ? ?PAST MEDICAL HISTORY: ?Past Medical History:  ?Diagnosis Date  ? Anxiety and depression   ? CAD (coronary artery disease)   ? a. s/p STEMI 2016 s/p DES x 2 PDA with residual dz treated medically. b.  [place] in 2020 with cath s/p DES to long high grade lesion in LAD, residual disease treated medically, EF 50%.  ? Diabetes mellitus, type II ([place])   ? Family history of adverse reaction to anesthesia   ? PONV  ? Fibromyalgia   ? GERD (gastroesophageal reflux disease)   ? Hyperlipidemia   ? Hypertension   ? Ischemic cardiomyopathy   ? a. EF <25% in 2016, improved to normal thereafter. b. EF 50% in 05/2019.  ? Myocardial infarct [facility])   ? august 2016  ? ? ?PAST SURGICAL HISTORY: ?Past Surgical History:  ?Procedure Laterality Date  ? BIOPSY  08/15/2020  ? Procedure: BIOPSY;  Surgeon: Harvel Quale, MD;  [facility];  Service: Gastroenterology;;  ? CARDIAC CATHETERIZATION N/A 07/05/2015  ? Procedure: Left Heart Cath and Coronary Angiography;  Surgeon: Lorretta Harp, MD;  Location: [place] CV LAB;  Service: Cardiovascular;  Laterality: N/A;  ? CESAREAN SECTION    ? COLONOSCOPY WITH PROPOFOL N/A 08/15/2020  ? Procedure: COLONOSCOPY WITH PROPOFOL;  Surgeon: Harvel Quale, MD;  Location: [facility];  Service: Gastroenterology;  Laterality: N/A;  1230  ? CORONARY STENT INTERVENTION N/A 05/24/2019  ? Procedure: CORONARY STENT INTERVENTION;  Surgeon: Lorretta Harp, MD;  Location: [place] CV LAB;  Service: Cardiovascular;  Laterality: N/A;  ? ESOPHAGEAL DILATION N/A 08/15/2020  ? Procedure: ESOPHAGEAL DILATION;  Surgeon: Montez Morita, Quillian Quince, MD;  [facility];  Service: Gastroenterology;  Laterality: N/A;  ? ESOPHAGOGASTRODUODENOSCOPY (EGD) WITH PROPOFOL N/A 08/15/2020  ? Procedure: ESOPHAGOGASTRODUODENOSCOPY (EGD) WITH PROPOFOL;  Surgeon: Harvel Quale, MD;  [facility];  Service: Gastroenterology;  Laterality: N/A;  ? HERNIA REPAIR Left   ? x2 ventral and inguinal  ? LEFT HEART CATH AND CORONARY ANGIOGRAPHY N/A 05/24/2019  ? Procedure: LEFT HEART CATH AND CORONARY ANGIOGRAPHY;  Surgeon: Lorretta Harp, MD;  Location: [place] CV LAB;   Service: Cardiovascular;  Laterality: N/A;  ? LEFT HEART CATH AND CORONARY ANGIOGRAPHY N/A 05/05/2021  ? Procedure: LEFT HEART CATH AND CORONARY ANGIOGRAPHY;  Surgeon: Troy Sine, MD;  Location: [place] CV LAB;  Service: Cardiovascular;  Laterality: N/A;  ? POLYPECTOMY  08/15/2020  ? Procedure: POLYPECTOMY;  Surgeon: Harvel Quale, MD;  [facility];  Service: Gastroenterology;;  ? TONSILLECTOMY    ? TUBAL LIGATION    ? ? ?FAMILY HISTORY: ?Family History  ?Problem Relation Age of Onset  ? Alcohol abuse Mother   ? Anxiety disorder Father   ? Depression Father   ? Alcohol abuse Father   ? Anxiety disorder Sister   ? Depression Sister   ?  Depression Maternal Aunt   ? Depression Cousin   ? ? ?SOCIAL HISTORY: ?Social History  ? ?Socioeconomic History  ? Marital status: Divorced  ?  Spouse name: Not on file  ? Number of children: 2  ? Years of education: Not on file  ? Highest education level: Not on file  ?Occupational History  ? 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 Smoking status: Never  ? Smokeless tobacco: Never  ?Substance and Sexual Activity  ? Alcohol use: No  ?  Comment: occasionally  ? Drug use: No  ? Sexual activity: Yes  ?Other Topics Concern  ? Not on file  ?Social History Narrative  ? She is separated from her husband & lives with her boyfriend.  She has two children 19 & 26 as well as three grandchildren.  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    ? ?Social Determinants of Health  ? ?Financial Resource Strain: Not on file  ?Food Insecurity: Not on file  ?Transportation Needs: Not on file  ?Physical Activity: Not on file  ?Stress: Not on file  ?Social Connections: Not on file  ?Intimate Partner Violence: Not on file  ? ? ?PHYSICAL EXAM ? ? ?GENERAL EXAM/CONSTITUTIONAL: ?Vitals:  ?Vitals:  ? 03/11/22 1302  ?Weight: 165 lb (74.8 kg)  ?Height: '4\' 11"'$  (1.499 m)  ? ?Body mass index is 33.33 kg/m?. ?Wt Readings from Last 3 Encounters:  ?03/11/22 165 lb (74.8 kg)   ?12/21/21 163 lb 9.6 oz (74.2 kg)  ?07/09/21 176 lb (79.8 kg)  ? ?Orthostatic VS for the past 72 hrs (Last 3 readings): ? Orthostatic BP Patient Position Orthostatic Pulse  ?03/11/22 1304 122/78 Standing 78

## 2022-03-11 NOTE — Patient Instructions (Addendum)
Continue with Meclizine as needed  ?Follow up with ENT  ?Referral to vestibular rehab  ?Continue to monitor your blood pressure a home. You are on 4 anti hypertensive medications. Today your blood pressure was 115/76 ?Return as needed  ? ? ? ?

## 2022-03-17 ENCOUNTER — Ambulatory Visit: Payer: 59 | Admitting: Orthopedic Surgery

## 2022-03-18 ENCOUNTER — Other Ambulatory Visit: Payer: Self-pay | Admitting: Student

## 2022-03-18 ENCOUNTER — Encounter: Payer: Self-pay | Admitting: *Deleted

## 2022-03-18 NOTE — Research (Unsigned)
I called patient to see if she is interested in the Essence Study which is a study to lower triglyceride levels. I asked patient to call if she is interested in screening visit or just has questions I can answer about the study. ?

## 2022-03-24 ENCOUNTER — Ambulatory Visit: Payer: 59 | Admitting: Orthopedic Surgery

## 2022-04-26 ENCOUNTER — Other Ambulatory Visit: Payer: Self-pay | Admitting: Student

## 2022-05-19 ENCOUNTER — Other Ambulatory Visit: Payer: Self-pay | Admitting: Student

## 2022-05-19 ENCOUNTER — Other Ambulatory Visit: Payer: Self-pay | Admitting: Medical

## 2022-05-19 NOTE — Telephone Encounter (Signed)
This is a Dr. Debara Pickett pt

## 2022-05-19 NOTE — Telephone Encounter (Signed)
This is a Dr. Lysbeth Penner pt

## 2022-06-01 IMAGING — DX DG CHEST 2V
2 series · 2 of 2 positions shown · non-contrast
Comparison: 02/13/2020

CLINICAL DATA: Chest tightness radiating up to the neck. Ongoing
since last week.

EXAM:
CHEST - 2 VIEW

[chest pa]
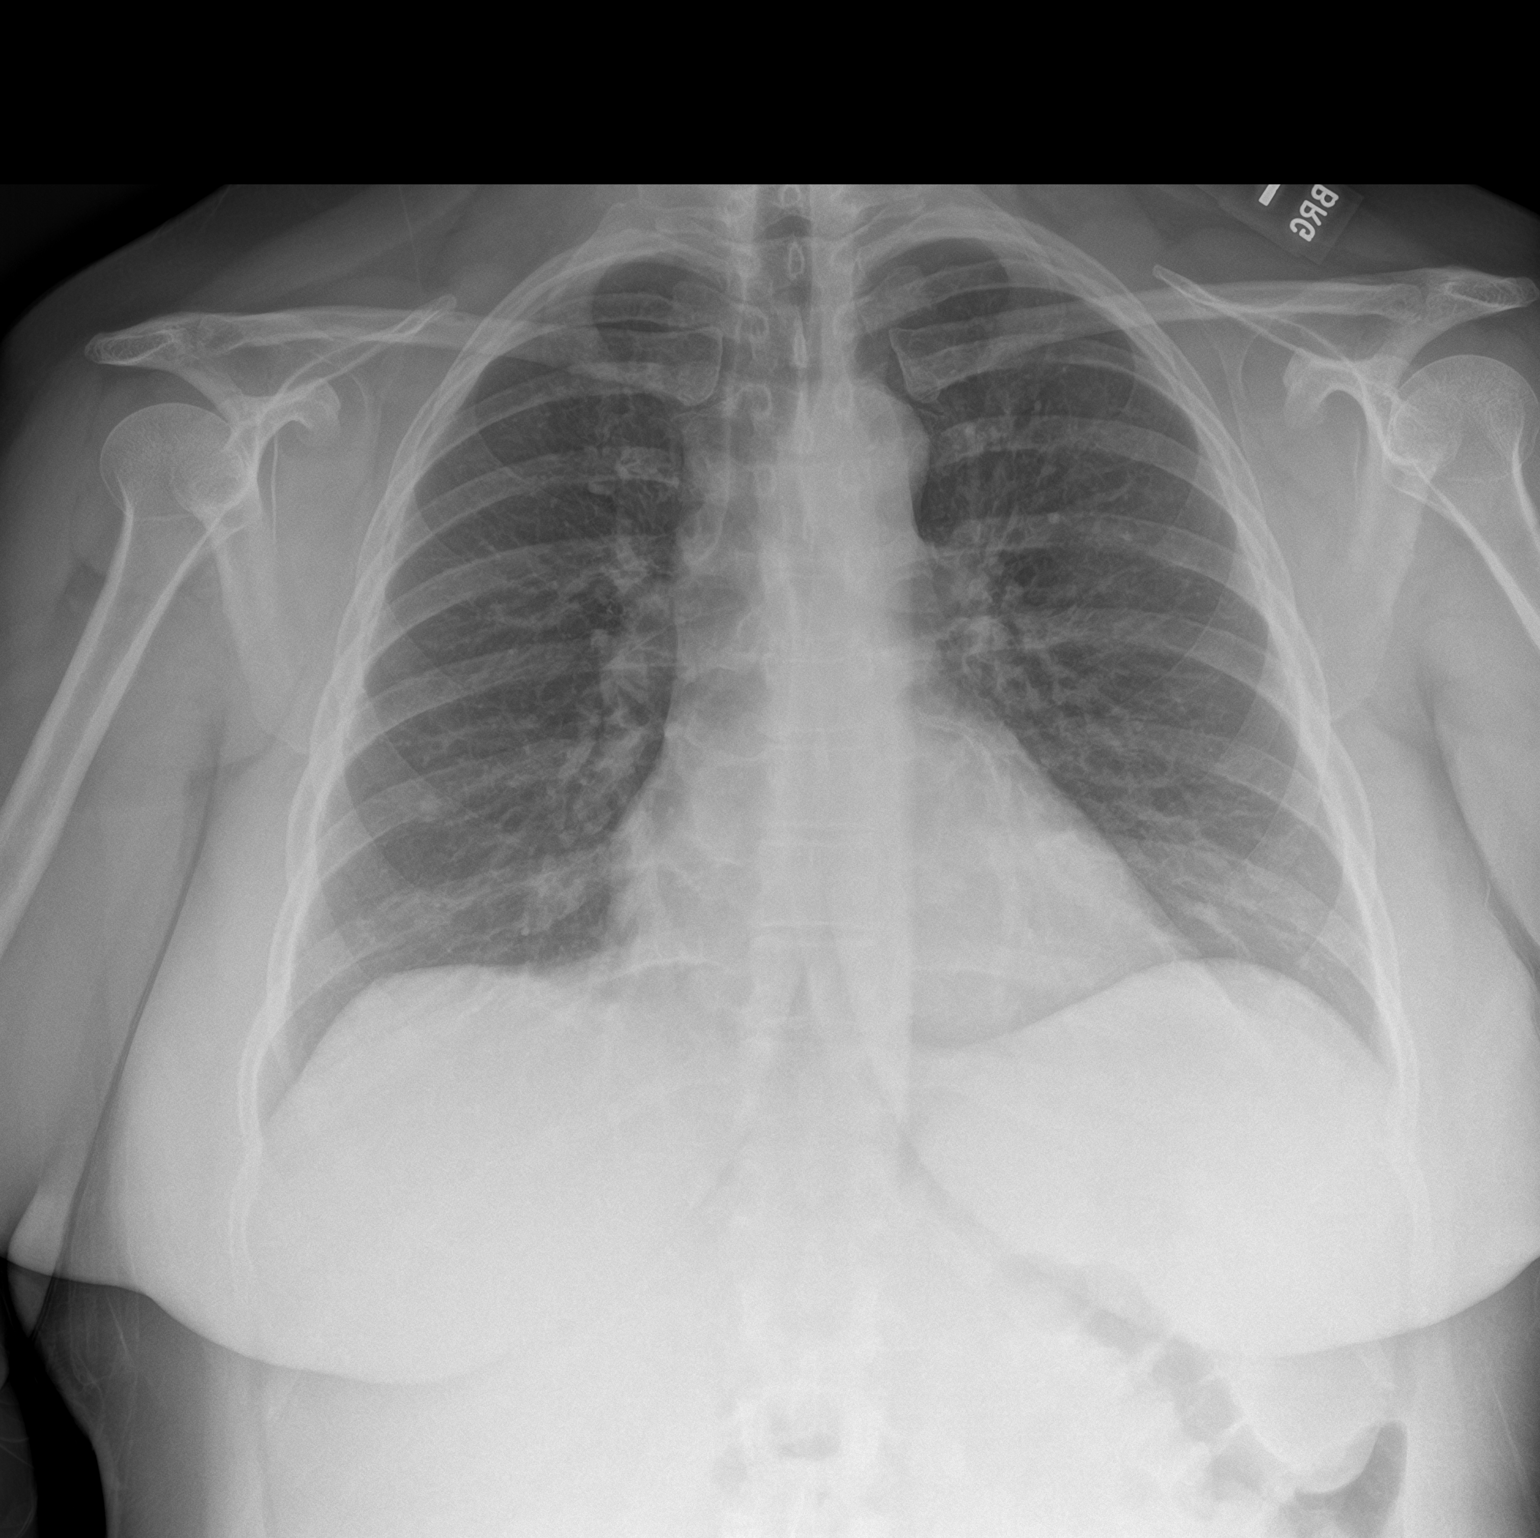

[chest lat]
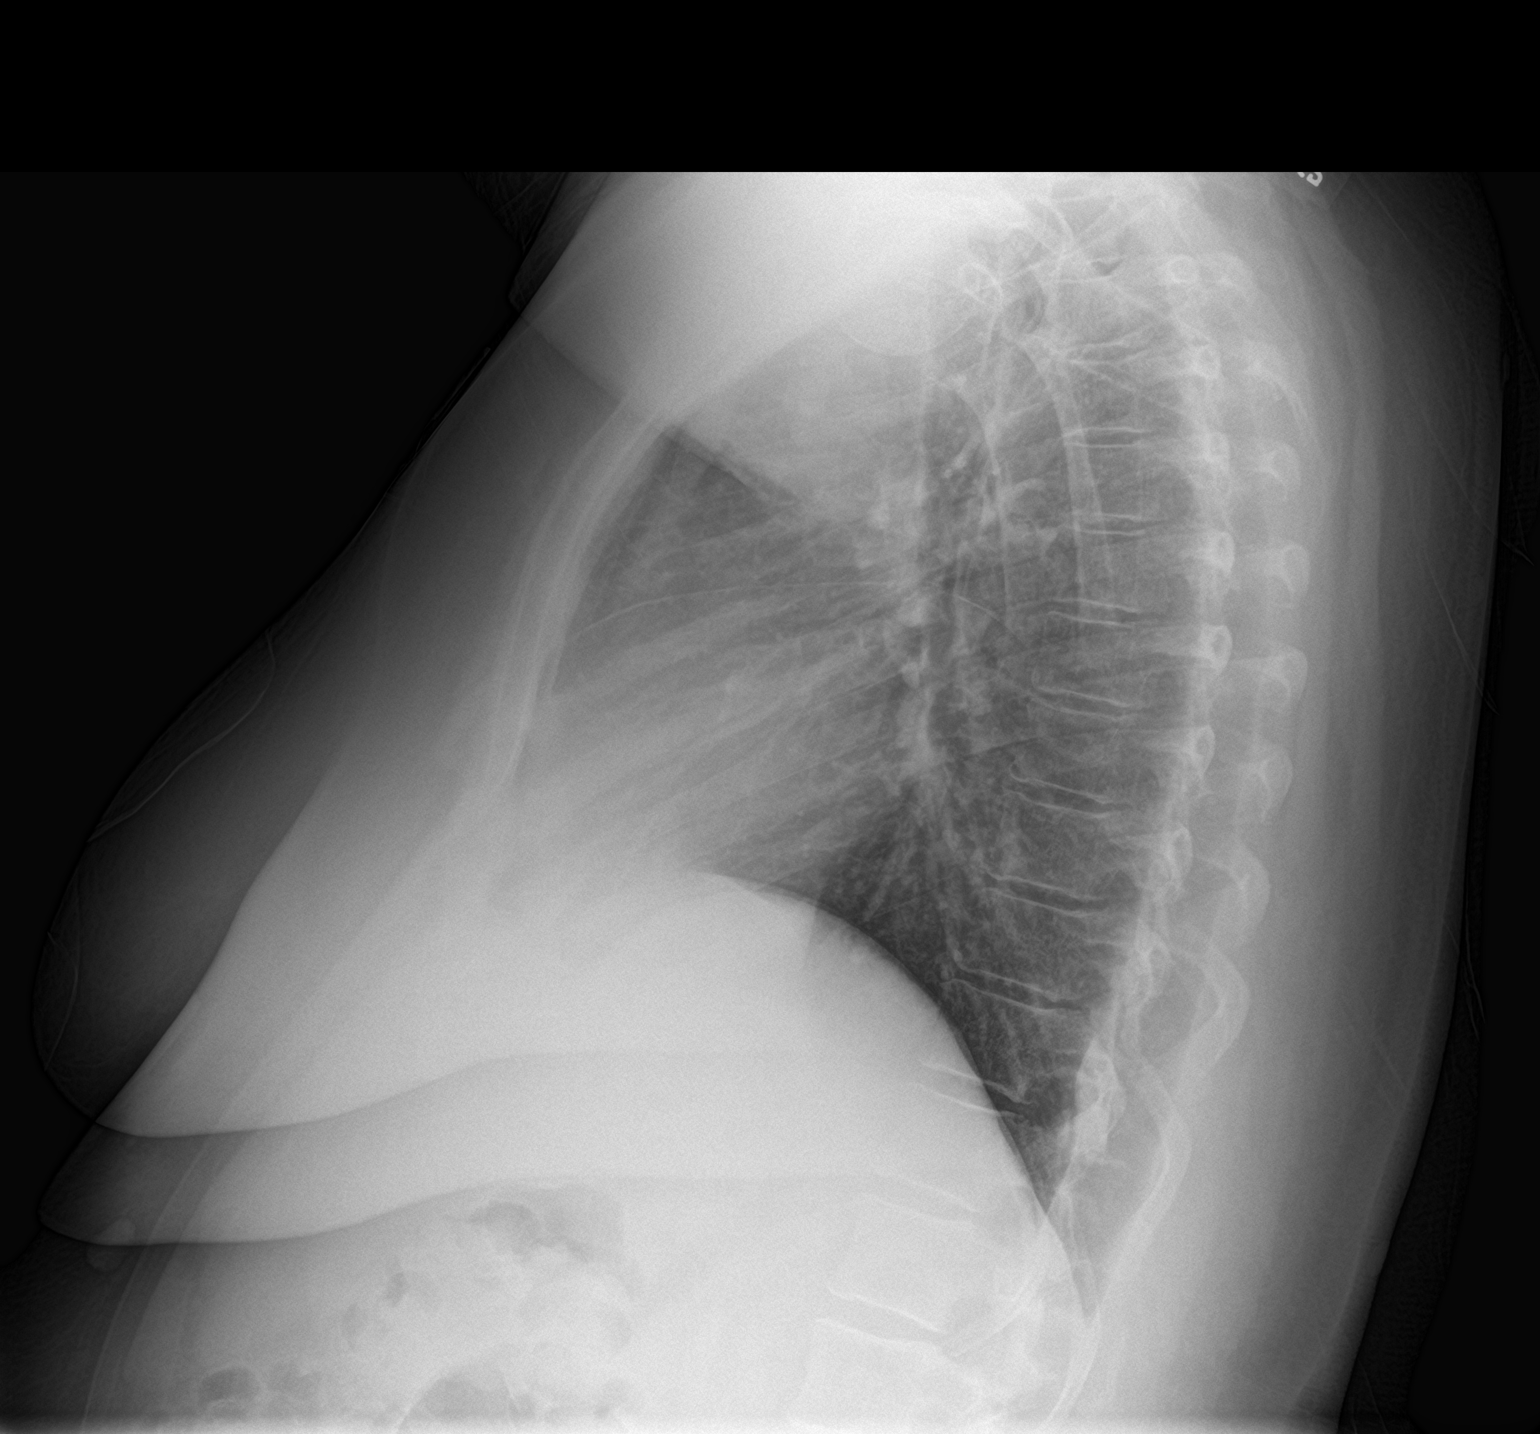

[2 of 2 positions shown; findings below may reference images not displayed]

FINDINGS: The heart size and mediastinal contours are within normal limits.
Both lungs are clear. The visualized skeletal structures are
unremarkable.
IMPRESSION: No active cardiopulmonary disease.

## 2022-07-07 ENCOUNTER — Other Ambulatory Visit: Payer: Self-pay | Admitting: Internal Medicine

## 2022-07-12 ENCOUNTER — Telehealth: Payer: Self-pay | Admitting: Cardiology

## 2022-07-12 NOTE — Telephone Encounter (Signed)
   Pre-operative Risk Assessment    Patient Name: Amy Snow  DOB: [birth year] MRN: [id number]     Request for Surgical Clearance    Procedure:  Dental Extraction - Amount of Teeth to be Pulled:  11  Date of Surgery:  Clearance TBD                                 Surgeon:  Sander Nephew DDS Surgeon's Group or Practice Name:  [facility]  Phone number:  [phone] Fax number:  [phone]   Type of Clearance Requested:   - Medical    Type of Anesthesia:  Local    Additional requests/questions:    Sandrea Hammond   07/12/2022, 2:34 PM

## 2022-07-12 NOTE — Telephone Encounter (Signed)
   Name: Amy Snow  DOB: [birth year]  MRN: [id number]  Primary Cardiologist: Remotely Dr. Gwenlyn Found then followed by [facility] (Dr. Harl Bowie was listed but has never seen the patient before)  Chart reviewed as part of pre-operative protocol coverage. Because of [name]'s past medical history and time since last visit, she will require a follow-up in-office visit in order to better assess preoperative cardiovascular risk.  Their last general cardiology appointment was in 01/2020. They saw Dr. Debara Pickett 1 year ago but this was for lipid management. Needs general cardiology OV visit.   Pre-op covering staff: - Please schedule appointment and call patient to inform them. If patient already had an upcoming appointment within acceptable timeframe, please add "pre-op clearance" to the appointment notes so provider is aware. - Please contact requesting surgeon's office via preferred method (i.e, phone, fax) to inform them of need for appointment prior to surgery.  Preliminarily do not see any SBE ppx needs but can reviewed at time of visit. She is also maintained on ASA/Plavix but hasn't been seen in over a year so unclear if she is taking these. Nevertheless, need to route to Dr. Gwenlyn Found for input on whether she would be OK to hold for multiple dental extractions if needed - had STEMI in 2016 with DES x2 to PDA, cath in 05/2019 with DES to LAD, then repeat cath for [place] in 04/2021 showing patent PDA and p-mLAD stents, 30% prox and mid RCA stenosis, 80% OM1 stenosis, 70% prox and 80% mid Lcx stenosis (improved from 95 and 90% respectively), with EF 55-60%. Given length of stent in LAD, needs MD input on holding DAPT. Dr. Gwenlyn Found - Please route response to P CV DIV PREOP (the pre-op pool). Thank you.   Charlie Pitter, PA-C  07/12/2022, 3:26 PM

## 2022-07-12 NOTE — Telephone Encounter (Signed)
Spoke with patient who states that she already ready had her teeth pulled today and would like an appt made in [place] if possible. I will route to [facility] scheduling team to make the appointment. Patient thanked me for the call

## 2022-07-12 NOTE — Telephone Encounter (Signed)
Clarified with MD last intervention 2020, he says still OK to hold -> final rec will be forthcoming at upcoming OV, routed below to callback team to help schedule overdue follow-up.

## 2022-07-14 ENCOUNTER — Ambulatory Visit (INDEPENDENT_AMBULATORY_CARE_PROVIDER_SITE_OTHER): Payer: 59 | Admitting: Orthopedic Surgery

## 2022-07-14 ENCOUNTER — Encounter: Payer: Self-pay | Admitting: Orthopedic Surgery

## 2022-07-14 ENCOUNTER — Ambulatory Visit: Payer: Self-pay

## 2022-07-14 ENCOUNTER — Ambulatory Visit (INDEPENDENT_AMBULATORY_CARE_PROVIDER_SITE_OTHER): Payer: 59

## 2022-07-14 VITALS — Ht 59.0 in | Wt 165.0 lb

## 2022-07-14 DIAGNOSIS — G8929 Other chronic pain: Secondary | ICD-10-CM

## 2022-07-14 DIAGNOSIS — M25511 Pain in right shoulder: Secondary | ICD-10-CM

## 2022-07-14 DIAGNOSIS — M25512 Pain in left shoulder: Secondary | ICD-10-CM | POA: Diagnosis not present

## 2022-07-14 DIAGNOSIS — M75111 Incomplete rotator cuff tear or rupture of right shoulder, not specified as traumatic: Secondary | ICD-10-CM | POA: Diagnosis not present

## 2022-07-14 MED ORDER — METHYLPREDNISOLONE ACETATE 40 MG/ML IJ SUSP
40.0000 mg | Freq: Once | INTRAMUSCULAR | Status: AC
  Administered 2022-07-14: 40 mg via INTRA_ARTICULAR

## 2022-07-14 NOTE — Progress Notes (Unsigned)
New Patient Visit  Assessment: Amy Snow is a 56 y.o. female with the following: 1. Chronic pain of both shoulders 2. Partial nontraumatic rupture of right rotator cuff   Plan: Amy Snow has pain in both shoulders.  Atraumatic onset.  She continues to have difficulty with overhead motion.  She has an MRI of the right shoulder which demonstrates a high-grade partial tear of the rotator cuff.  She is not interested in surgery.  She would like to continue with nonoperative management.  I offered her a steroid injection in clinic today, she elected to proceed.  Depending on the efficacy of the injection of the right shoulder, she can return for an injection in the left shoulder.  No issues with the injection.  She will follow-up as needed.  Procedure note injection - Right shoulder    Verbal consent was obtained to inject the right shoulder, subacromial space Timeout was completed to confirm the site of injection.   The skin was prepped with alcohol and ethyl chloride was sprayed at the injection site.  A 21-gauge needle was used to inject 40 mg of Depo-Medrol and 1% lidocaine (3 cc) into the subacromial space of the right shoulder using a posterolateral approach.  There were no complications.  A sterile bandage was applied.    Follow-up: Return if symptoms worsen or fail to improve.  Subjective:  Chief Complaint  Patient presents with   Shoulder Pain    Bil shoulder started with the Rt. Pt states she does a lot of lifting over head at work but she's noticing it's getting harder to do.     History of Present Illness: Amy Snow is a 56 y.o. female who presents for evaluation of bilateral shoulder pain.  She states the right is worse than the left.  No specific injury.  Pain is getting progressively worse.  Her function is limited.  She has difficulty with overhead motion.  She states that she used to do a lot of lifting over her head at work, and is noticing that  this continues to get more more difficult.  No prior injections.  She has not worked with physical therapy.   Review of Systems: No fevers or chills No numbness or tingling No chest pain No shortness of breath No bowel or bladder dysfunction No GI distress No headaches   Medical History:  Past Medical History:  Diagnosis Date   Anxiety and depression    CAD (coronary artery disease)    a. s/p STEMI 2016 s/p DES x 2 PDA with residual dz treated medically. b. [place] in 2020 with cath s/p DES to long high grade lesion in LAD, residual disease treated medically, EF 50%.   Diabetes mellitus, type II ([place])    Family history of adverse reaction to anesthesia    PONV   Fibromyalgia    GERD (gastroesophageal reflux disease)    Hyperlipidemia    Hypertension    Ischemic cardiomyopathy    a. EF <25% in 2016, improved to normal thereafter. b. EF 50% in 05/2019.   Myocardial infarct [facility])    august 2016    Past Surgical History:  Procedure Laterality Date   BIOPSY  08/15/2020   Procedure: BIOPSY;  Surgeon: Harvel Quale, MD;  [facility];  Service: Gastroenterology;;   CARDIAC CATHETERIZATION N/A 07/05/2015   Procedure: Left Heart Cath and Coronary Angiography;  Surgeon: Lorretta Harp, MD;  Location: [place] [facility];  Service: Cardiovascular;  Laterality: N/A;   CESAREAN SECTION     COLONOSCOPY WITH PROPOFOL N/A 08/15/2020   Procedure: COLONOSCOPY WITH PROPOFOL;  Surgeon: Harvel Quale, MD;  [facility];  Service: Gastroenterology;  Laterality: N/A;  1230   CORONARY STENT INTERVENTION N/A 05/24/2019   Procedure: CORONARY STENT INTERVENTION;  Surgeon: Lorretta Harp, MD;  Location: [place] [facility];  Service: Cardiovascular;  Laterality: N/A;   ESOPHAGEAL DILATION N/A 08/15/2020   Procedure: ESOPHAGEAL DILATION;  Surgeon: Harvel Quale, MD;  [facility];  Service: Gastroenterology;  Laterality: N/A;    ESOPHAGOGASTRODUODENOSCOPY (EGD) WITH PROPOFOL N/A 08/15/2020   Procedure: ESOPHAGOGASTRODUODENOSCOPY (EGD) WITH PROPOFOL;  Surgeon: Harvel Quale, MD;  [facility];  Service: Gastroenterology;  Laterality: N/A;   HERNIA REPAIR Left    x2 ventral and inguinal   LEFT HEART CATH AND CORONARY ANGIOGRAPHY N/A 05/24/2019   Procedure: LEFT HEART CATH AND CORONARY ANGIOGRAPHY;  Surgeon: Lorretta Harp, MD;  Location: [place] [facility];  Service: Cardiovascular;  Laterality: N/A;   LEFT HEART CATH AND CORONARY ANGIOGRAPHY N/A 05/05/2021   Procedure: LEFT HEART CATH AND CORONARY ANGIOGRAPHY;  Surgeon: Troy Sine, MD;  Location: [place] [facility];  Service: Cardiovascular;  Laterality: N/A;   POLYPECTOMY  08/15/2020   Procedure: POLYPECTOMY;  Surgeon: Montez Morita, Quillian Quince, MD;  [facility];  Service: Gastroenterology;;   TONSILLECTOMY     TUBAL LIGATION      Family History  Problem Relation Age of Onset   Alcohol abuse Mother    Anxiety disorder Father    Depression Father    Alcohol abuse Father    Anxiety disorder Sister    Depression Sister    Depression Maternal Aunt    Depression Cousin    Social History   Tobacco Use   Smoking status: Never   Smokeless tobacco: Never  Substance Use Topics   Alcohol use: No    Comment: occasionally   Drug use: No    No Known Allergies  Current Meds  Medication Sig   acetaminophen (TYLENOL) 650 MG CR tablet Take 650-1,300 mg by mouth every 8 (eight) hours as needed for pain.    albuterol (VENTOLIN HFA) 108 (90 Base) MCG/ACT inhaler every 4 (four) hours as needed.   amitriptyline (ELAVIL) 50 MG tablet Take 50 mg by mouth at bedtime.   aspirin 81 MG chewable tablet Chew 1 tablet (81 mg total) by mouth daily.   atorvastatin (LIPITOR) 80 MG tablet Take 1 tablet (80 mg total) by mouth daily.   carvedilol (COREG) 25 MG tablet Take 1 tablet (25 mg total) by mouth 2 (two) times daily.   clopidogrel  (PLAVIX) 75 MG tablet Take 1 tablet (75 mg total) by mouth daily.   hydrochlorothiazide (MICROZIDE) 12.5 MG capsule TAKE 1 CAPSULE BY MOUTH ONCE DAILY-TAKE AN EXTRA CAPSULE DAILY AS NEEDED FOR WEIGHT GAIN OF 3 LBS IN ONE DAY OR 5 LBS IN ONE WEEK   ibuprofen (ADVIL) 800 MG tablet Take 800 mg by mouth 3 (three) times daily as needed.   insulin NPH-regular Human (70-30) 100 UNIT/ML injection Inject 80 Units into the skin 2 (two) times a day.    isosorbide mononitrate (IMDUR) 60 MG 24 hr tablet Take 1 tablet (60 mg total) by mouth daily.   lisinopril (ZESTRIL) 20 MG tablet Take 1 tablet by mouth once daily   meclizine (ANTIVERT) 12.5 MG tablet Take 12.5 mg by mouth every 6 (six) hours  as needed.   metFORMIN (GLUCOPHAGE) 500 MG tablet Take 500 mg by mouth 2 (two) times daily.   nitroGLYCERIN (NITROSTAT) 0.4 MG SL tablet Place 1 tablet (0.4 mg total) under the tongue every 5 (five) minutes x 3 doses as needed for chest pain.   ondansetron (ZOFRAN) 4 MG tablet Take 4 mg by mouth every 8 (eight) hours as needed.   pantoprazole (PROTONIX) 40 MG tablet Take 1 tablet by mouth once daily   sertraline (ZOLOFT) 100 MG tablet Take 150 mg by mouth at bedtime.    topiramate (TOPAMAX) 50 MG tablet Take 50 mg by mouth 2 (two) times daily.   traZODone (DESYREL) 50 MG tablet Take 50 mg by mouth at bedtime.    Objective: Ht '4\' 11"'$  (1.499 m)   Wt 165 lb (74.8 kg)   LMP 12/05/2015   BMI 33.33 kg/m   Physical Exam:  General: Alert and oriented. and No acute distress. Gait: Normal gait.  Shoulders without deformity.  Forward flexion limited to approximately 100 degrees.  Slightly restricted external rotation.  She has difficulty getting to her lumbar spine.  Positive Jobe's.  4+/5 supraspinatus and infraspinatus strength testing.  Negative belly press.  Fingers are warm and well-perfused.  IMAGING: I personally ordered and reviewed the following images  X-rays of bilateral shoulders were obtained in clinic  today.  No acute injuries are noted.  No evidence of proximal humeral migration.  Minimal degenerative changes.  No degenerative changes of the AC joints.  Impression: Negative bilateral shoulder x-ray   New Medications:  Meds ordered this encounter  Medications   methylPREDNISolone acetate (DEPO-MEDROL) injection 40 mg      Mordecai Rasmussen, MD  07/15/2022 11:02 AM

## 2022-07-15 ENCOUNTER — Encounter: Payer: Self-pay | Admitting: Orthopedic Surgery

## 2022-07-24 ENCOUNTER — Encounter (HOSPITAL_COMMUNITY): Payer: Self-pay

## 2022-07-24 ENCOUNTER — Emergency Department (HOSPITAL_COMMUNITY)
Admission: EM | Admit: 2022-07-24 | Discharge: 2022-07-24 | Disposition: A | Discharge status: Home / Self Care | Payer: 59 | Attending: Emergency Medicine | Admitting: Emergency Medicine

## 2022-07-24 ENCOUNTER — Emergency Department (HOSPITAL_COMMUNITY): Payer: 59

## 2022-07-24 ENCOUNTER — Other Ambulatory Visit: Payer: Self-pay

## 2022-07-24 DIAGNOSIS — J189 Pneumonia, unspecified organism: Secondary | ICD-10-CM | POA: Diagnosis present

## 2022-07-24 DIAGNOSIS — Z7982 Long term (current) use of aspirin: Secondary | ICD-10-CM | POA: Insufficient documentation

## 2022-07-24 DIAGNOSIS — E119 Type 2 diabetes mellitus without complications: Secondary | ICD-10-CM

## 2022-07-24 DIAGNOSIS — Z7984 Long term (current) use of oral hypoglycemic drugs: Secondary | ICD-10-CM | POA: Diagnosis not present

## 2022-07-24 DIAGNOSIS — Z79899 Other long term (current) drug therapy: Secondary | ICD-10-CM | POA: Diagnosis not present

## 2022-07-24 DIAGNOSIS — R059 Cough, unspecified: Secondary | ICD-10-CM | POA: Diagnosis not present

## 2022-07-24 DIAGNOSIS — A419 Sepsis, unspecified organism: Secondary | ICD-10-CM | POA: Diagnosis not present

## 2022-07-24 DIAGNOSIS — R5383 Other fatigue: Secondary | ICD-10-CM | POA: Diagnosis not present

## 2022-07-24 DIAGNOSIS — Z794 Long term (current) use of insulin: Secondary | ICD-10-CM | POA: Insufficient documentation

## 2022-07-24 DIAGNOSIS — Z20822 Contact with and (suspected) exposure to covid-19: Secondary | ICD-10-CM | POA: Insufficient documentation

## 2022-07-24 DIAGNOSIS — I10 Essential (primary) hypertension: Secondary | ICD-10-CM | POA: Diagnosis present

## 2022-07-24 DIAGNOSIS — I2510 Atherosclerotic heart disease of native coronary artery without angina pectoris: Secondary | ICD-10-CM | POA: Diagnosis present

## 2022-07-24 DIAGNOSIS — R000 Tachycardia, unspecified: Secondary | ICD-10-CM | POA: Diagnosis not present

## 2022-07-24 DIAGNOSIS — R509 Fever, unspecified: Secondary | ICD-10-CM | POA: Diagnosis not present

## 2022-07-24 DIAGNOSIS — E1159 Type 2 diabetes mellitus with other circulatory complications: Secondary | ICD-10-CM

## 2022-07-24 LAB — CBC WITH DIFFERENTIAL/PLATELET
Abs Immature Granulocytes: 0.04 10*3/uL (ref 0.00–0.07)
Basophils Absolute: 0 10*3/uL (ref 0.0–0.1)
Basophils Relative: 0 %
Eosinophils Absolute: 0.1 10*3/uL (ref 0.0–0.5)
Eosinophils Relative: 1 %
HCT: 35.6 % — ABNORMAL LOW (ref 36.0–46.0)
Hemoglobin: 11.2 g/dL — ABNORMAL LOW (ref 12.0–15.0)
Immature Granulocytes: 0 %
Lymphocytes Relative: 7 %
Lymphs Abs: 0.7 10*3/uL (ref 0.7–4.0)
MCH: 26 pg (ref 26.0–34.0)
MCHC: 31.5 g/dL (ref 30.0–36.0)
MCV: 82.8 fL (ref 80.0–100.0)
Monocytes Absolute: 0.6 10*3/uL (ref 0.1–1.0)
Monocytes Relative: 6 %
Neutro Abs: 8.6 10*3/uL — ABNORMAL HIGH (ref 1.7–7.7)
Neutrophils Relative %: 86 %
Platelets: 206 10*3/uL (ref 150–400)
RBC: 4.3 MIL/uL (ref 3.87–5.11)
RDW: 15.1 % (ref 11.5–15.5)
WBC: 10 10*3/uL (ref 4.0–10.5)
nRBC: 0 % (ref 0.0–0.2)

## 2022-07-24 LAB — COMPREHENSIVE METABOLIC PANEL
ALT: 6 U/L (ref 0–44)
AST: 16 U/L (ref 15–41)
Albumin: 3.7 g/dL (ref 3.5–5.0)
Alkaline Phosphatase: 70 U/L (ref 38–126)
Anion gap: 8 (ref 5–15)
BUN: 25 mg/dL — ABNORMAL HIGH (ref 6–20)
CO2: 22 mmol/L (ref 22–32)
Calcium: 8.9 mg/dL (ref 8.9–10.3)
Chloride: 111 mmol/L (ref 98–111)
Creatinine, Ser: 0.96 mg/dL (ref 0.44–1.00)
GFR, Estimated: >60 mL/min (ref >60)
Glucose, Bld: 148 mg/dL — ABNORMAL HIGH (ref 70–99)
Potassium: 3.2 mmol/L — ABNORMAL LOW (ref 3.5–5.1)
Sodium: 141 mmol/L (ref 135–145)
Total Bilirubin: 0.6 mg/dL (ref 0.3–1.2)
Total Protein: 7 g/dL (ref 6.5–8.1)

## 2022-07-24 LAB — LACTIC ACID, PLASMA: Lactic Acid, Venous: 1.8 mmol/L (ref 0.5–1.9)

## 2022-07-24 LAB — RESP PANEL BY RT-PCR (FLU A&B, COVID) ARPGX2
Influenza A by PCR: NEGATIVE
Influenza B by PCR: NEGATIVE
SARS Coronavirus 2 by RT PCR: NEGATIVE

## 2022-07-24 LAB — APTT: aPTT: 25 seconds (ref 24–36)

## 2022-07-24 LAB — PROTIME-INR
INR: 1.1 (ref 0.8–1.2)
Prothrombin Time: 13.6 seconds (ref 11.4–15.2)

## 2022-07-24 MED ORDER — DM-GUAIFENESIN ER 30-600 MG PO TB12: 1.0000 | ORAL_TABLET | Freq: Two times a day (BID) | ORAL | Status: DC

## 2022-07-24 MED ORDER — LISINOPRIL 20 MG PO TABS: 20.0000 mg | ORAL_TABLET | Freq: Every day | ORAL | 0 refills | Status: DC

## 2022-07-24 MED ORDER — LACTATED RINGERS IV SOLN: INTRAVENOUS | Status: DC

## 2022-07-24 MED ORDER — LACTATED RINGERS IV BOLUS (SEPSIS)
1000.0000 mL | Freq: Once | INTRAVENOUS | Status: AC
  Administered 2022-07-24: 1000 mL via INTRAVENOUS

## 2022-07-24 MED ORDER — POTASSIUM CHLORIDE CRYS ER 20 MEQ PO TBCR
40.0000 meq | EXTENDED_RELEASE_TABLET | Freq: Once | ORAL | Status: AC
  Administered 2022-07-24: 40 meq via ORAL
  Filled 2022-07-24: qty 2

## 2022-07-24 MED ORDER — LACTATED RINGERS IV BOLUS (SEPSIS): 250.0000 mL | Freq: Once | INTRAVENOUS | Status: DC

## 2022-07-24 MED ORDER — ISOSORBIDE MONONITRATE ER 60 MG PO TB24: 60.0000 mg | ORAL_TABLET | Freq: Every day | ORAL | 3 refills | Status: DC

## 2022-07-24 MED ORDER — DM-GUAIFENESIN ER 30-600 MG PO TB12: 1.0000 | ORAL_TABLET | Freq: Two times a day (BID) | ORAL | 0 refills | Status: DC

## 2022-07-24 MED ORDER — AZITHROMYCIN 500 MG PO TABS: 500.0000 mg | ORAL_TABLET | Freq: Every day | ORAL | 0 refills | Status: AC

## 2022-07-24 MED ORDER — HYDROCHLOROTHIAZIDE 12.5 MG PO CAPS
ORAL_CAPSULE | ORAL | 0 refills | Status: DC
Start: 2022-07-26 — End: 2023-05-12

## 2022-07-24 MED ORDER — SODIUM CHLORIDE 0.9 % IV SOLN
2.0000 g | INTRAVENOUS | Status: DC
  Administered 2022-07-24: 2 g via INTRAVENOUS
  Filled 2022-07-24: qty 20

## 2022-07-24 MED ORDER — ALBUTEROL SULFATE (2.5 MG/3ML) 0.083% IN NEBU: 2.5000 mg | INHALATION_SOLUTION | RESPIRATORY_TRACT | Status: DC | PRN

## 2022-07-24 MED ORDER — ACETAMINOPHEN 500 MG PO TABS
1000.0000 mg | ORAL_TABLET | Freq: Once | ORAL | Status: AC
  Administered 2022-07-24: 1000 mg via ORAL
  Filled 2022-07-24: qty 2

## 2022-07-24 MED ORDER — POTASSIUM CHLORIDE ER 10 MEQ PO TBCR: 10.0000 meq | EXTENDED_RELEASE_TABLET | Freq: Every day | ORAL | 2 refills | Status: DC

## 2022-07-24 MED ORDER — IPRATROPIUM-ALBUTEROL 0.5-2.5 (3) MG/3ML IN SOLN
3.0000 mL | Freq: Four times a day (QID) | RESPIRATORY_TRACT | Status: DC
  Administered 2022-07-24: 3 mL via RESPIRATORY_TRACT
  Filled 2022-07-24: qty 3

## 2022-07-24 MED ORDER — CEFDINIR 300 MG PO CAPS: 300.0000 mg | ORAL_CAPSULE | Freq: Two times a day (BID) | ORAL | 0 refills | Status: AC

## 2022-07-24 MED ORDER — AZITHROMYCIN 500 MG PO TABS: 500.0000 mg | ORAL_TABLET | Freq: Every day | ORAL | 0 refills | Status: DC

## 2022-07-24 MED ORDER — LACTATED RINGERS IV BOLUS (SEPSIS): 1000.0000 mL | Freq: Once | INTRAVENOUS | Status: DC

## 2022-07-24 MED ORDER — SODIUM CHLORIDE 0.9 % IV SOLN
500.0000 mg | INTRAVENOUS | Status: DC
  Administered 2022-07-24: 500 mg via INTRAVENOUS
  Filled 2022-07-24: qty 5

## 2022-07-24 MED ORDER — SODIUM CHLORIDE 0.9 % IV BOLUS
1000.0000 mL | Freq: Once | INTRAVENOUS | Status: AC
  Administered 2022-07-24: 1000 mL via INTRAVENOUS

## 2022-07-24 MED ORDER — ALBUTEROL SULFATE HFA 108 (90 BASE) MCG/ACT IN AERS: 2.0000 | INHALATION_SPRAY | RESPIRATORY_TRACT | 0 refills | Status: AC | PRN

## 2022-07-24 NOTE — ED Notes (Signed)
Patient transported to X-ray 

## 2022-07-24 NOTE — Discharge Instructions (Signed)
1) please hold hydrochlorothiazide/HCTZ, isosorbide and lisinopril until Monday, 07/26/2022--- please recheck your blood pressure prior to restarting the 3 medications listed above  2) you are taking aspirin and clopidogrel/Plavix--which are Both blood thinners, so Please Avoid ibuprofen/Advil/Aleve/Motrin/Goody Powders/Naproxen/BC powders/Meloxicam/Diclofenac/Indomethacin and other Nonsteroidal anti-inflammatory medications as these will make you more likely to bleed and can cause stomach ulcers, can also cause Kidney problems.   3) please follow-up to primary care physician in 3 to 4 weeks for repeat chest x-ray to make sure that the findings of the chest x-ray has resolved.... At times there may be other things other than a pneumonia on your chest x-ray that might need further attention  4) your potassium is low most likely due to HCTZ/hydrochlorothiazide use--- potassium supplements have been prescribed for you to be taking only while taking hydrochlorothiazide/HCTZ

## 2022-07-24 NOTE — ED Provider Notes (Signed)
Medical screening examination/treatment/procedure(s) were conducted as a shared visit with non-physician practitioner(s) and myself.  I personally evaluated the patient during the encounter.  Clinical Impression:   Final diagnoses:  Sepsis due to pneumonia [facility])    This patient is a 56 year old female presenting with what appears to be sepsis from a pulmonary source.  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  She is tachypneic but not hypoxic at this time, she is not hypotensive, lactic acid pending, COVID ordered  EKG shows sinus tachycardia  Chest x-ray: I personally viewed and interpreted the x-ray which shows left-sided hazy opacities consistent with unilateral pneumonia, I compared this to an x-ray from June 2022 which was very clear, this is a new infiltrate.  Medical decision making: Patient will need antibiotics, admission for sepsis, she has SIRS criteria with a source of pneumonia, the patient is agreeable.  Critical care services provided, see physician assistant note  Patient with fever, heart rate above 90, obvious left-sided pneumonia, hospitalist consulted for admission   Noemi Chapel, MD 07/25/22 1504

## 2022-07-24 NOTE — ED Provider Notes (Signed)
[facility] EMERGENCY DEPARTMENT Provider Note   CSN: [id number] Arrival date & time: 07/24/22  1541     History  Chief Complaint  Patient presents with   Cough    Amy Snow is a 56 y.o. female with history of anxiety, depression, CAD s/p stent in 2020, diabetes, fibromyalgia, GERD, hypertension, hyperlipidemia who presents the emergency department complaining of cough for 1 week.  States that she has been feeling very fatigued, reports her cough is wet.  Also believes she had a fever at home. Tried taking codeine cough syrup with her normal nighttime medications (including trazodone and amitriptyline) and slept 17 hours. Works at a daycare. Three weeks ago had all her top teeth extracted.    Cough Associated symptoms: chest pain, chills, fever and shortness of breath        Home Medications Prior to Admission medications   Medication Sig Start Date End Date Taking? Authorizing Provider  acetaminophen (TYLENOL) 650 MG CR tablet Take 650-1,300 mg by mouth every 8 (eight) hours as needed for pain.     [provider]  albuterol (VENTOLIN HFA) 108 (90 Base) MCG/ACT inhaler every 4 (four) hours as needed. 06/16/21   [provider]  amitriptyline (ELAVIL) 50 MG tablet Take 50 mg by mouth at bedtime. 06/15/21   [provider]  aspirin 81 MG chewable tablet Chew 1 tablet (81 mg total) by mouth daily. 05/15/19   Barrett, Evelene Croon, PA-C  atorvastatin (LIPITOR) 80 MG tablet Take 1 tablet (80 mg total) by mouth daily. 05/07/21   Kroeger, Lorelee Cover., PA-C  carvedilol (COREG) 25 MG tablet Take 1 tablet (25 mg total) by mouth 2 (two) times daily. 05/06/21   Kroeger, Lorelee Cover., PA-C  clopidogrel (PLAVIX) 75 MG tablet Take 1 tablet (75 mg total) by mouth daily. 05/07/21   Kroeger, Lorelee Cover., PA-C  hydrochlorothiazide (MICROZIDE) 12.5 MG capsule TAKE 1 CAPSULE BY MOUTH ONCE DAILY-TAKE AN EXTRA CAPSULE DAILY AS NEEDED FOR WEIGHT GAIN OF 3 LBS IN ONE DAY OR 5 LBS IN ONE  WEEK 05/11/21   Arnoldo Lenis, MD  ibuprofen (ADVIL) 800 MG tablet Take 800 mg by mouth 3 (three) times daily as needed. 05/26/21   [provider]  insulin NPH-regular Human (70-30) 100 UNIT/ML injection Inject 80 Units into the skin 2 (two) times a day.     [provider]  isosorbide mononitrate (IMDUR) 60 MG 24 hr tablet Take 1 tablet (60 mg total) by mouth daily. 05/07/21   Kroeger, Lorelee Cover., PA-C  lisinopril (ZESTRIL) 20 MG tablet Take 1 tablet by mouth once daily 07/07/22   Hilty, Nadean Corwin, MD  meclizine (ANTIVERT) 12.5 MG tablet Take 12.5 mg by mouth every 6 (six) hours as needed. 05/26/21   [provider]  metFORMIN (GLUCOPHAGE) 500 MG tablet Take 500 mg by mouth 2 (two) times daily. 06/15/21   [provider]  nitroGLYCERIN (NITROSTAT) 0.4 MG SL tablet Place 1 tablet (0.4 mg total) under the tongue every 5 (five) minutes x 3 doses as needed for chest pain. 05/06/21   Kroeger, Lorelee Cover., PA-C  ondansetron (ZOFRAN) 4 MG tablet Take 4 mg by mouth every 8 (eight) hours as needed. 05/26/21   [provider]  pantoprazole (PROTONIX) 40 MG tablet Take 1 tablet by mouth once daily 05/19/22   Hilty, Nadean Corwin, MD  sertraline (ZOLOFT) 100 MG tablet Take 150 mg by mouth at bedtime.  01/09/20   [provider]  topiramate (TOPAMAX)  50 MG tablet Take 50 mg by mouth 2 (two) times daily. 06/15/21   [provider]  traZODone (DESYREL) 50 MG tablet Take 50 mg by mouth at bedtime.    [provider]      Allergies    Patient has no known allergies.    Review of Systems   Review of Systems  Constitutional:  Positive for appetite change, chills and fever.  HENT:  Negative for congestion.   Respiratory:  Positive for cough and shortness of breath.   Cardiovascular:  Positive for chest pain. Negative for palpitations and leg swelling.       Left upper chest pain  Gastrointestinal:  Negative for abdominal pain, diarrhea, nausea and  vomiting.  All other systems reviewed and are negative.   Physical Exam Updated Vital Signs BP 127/76   Pulse (!) 107   Temp 99.6 F (37.6 C) (Oral)   Resp (!) 31   Ht 4' 11.75" (1.518 m)   Wt 68 kg   LMP 12/05/2015   SpO2 92%   BMI 29.54 kg/m  Physical Exam Vitals and nursing note reviewed.  Constitutional:      Appearance: Normal appearance.  HENT:     Head: Normocephalic and atraumatic.  Eyes:     Conjunctiva/sclera: Conjunctivae normal.  Cardiovascular:     Rate and Rhythm: Normal rate and regular rhythm.  Pulmonary:     Effort: Pulmonary effort is normal. No respiratory distress.     Breath sounds: Examination of the left-middle field reveals rales. Examination of the left-lower field reveals rales. Rales present.  Abdominal:     General: There is no distension.     Palpations: Abdomen is soft.     Tenderness: There is no abdominal tenderness.  Skin:    General: Skin is warm and dry.     Comments: Appears flushed and skin warm to the touch  Neurological:     General: No focal deficit present.     Mental Status: She is alert.     ED Results / Procedures / Treatments   Labs (all labs ordered are listed, but only abnormal results are displayed) Labs Reviewed  COMPREHENSIVE METABOLIC PANEL - Abnormal; Notable for the following components:      Result Value   Potassium 3.2 (*)    Glucose, Bld 148 (*)    BUN 25 (*)    All other components within normal limits  CBC WITH DIFFERENTIAL/PLATELET - Abnormal; Notable for the following components:   Hemoglobin 11.2 (*)    HCT 35.6 (*)    Neutro Abs 8.6 (*)    All other components within normal limits  RESP PANEL BY RT-PCR (FLU A&B, COVID) ARPGX2  CULTURE, BLOOD (SINGLE)  URINE CULTURE  CULTURE, BLOOD (SINGLE)  LACTIC ACID, PLASMA  PROTIME-INR  APTT  URINALYSIS, ROUTINE W REFLEX MICROSCOPIC    EKG EKG Interpretation  Date/Time:  Saturday July 24 2022 16:46:30 EDT Ventricular Rate:  112 PR  Interval:  138 QRS Duration: 64 QT Interval:  331 QTC Calculation: 452 R Axis:   -2 Text Interpretation: Sinus tachycardia Anterior infarct, old Minimal ST depression, lateral leads Since last tracing rate faster Confirmed by Noemi Chapel [phone]) on 07/24/2022 5:15:11 PM  Radiology DG Chest 2 View  Result Date: 07/24/2022 CLINICAL DATA:  Cough since last week.  Fever.  Fatigue. EXAM: CHEST - 2 VIEW COMPARISON:  05/04/2021 FINDINGS: Diffuse patchy airspace disease in the left mid lower lung zone typical of pneumonia. There may be  a trace left pleural effusion. The right lung is clear. Normal heart size and mediastinal contours. No pneumothorax. No acute osseous findings. IMPRESSION: Diffuse patchy airspace disease in the left mid lower lung zone typical of pneumonia. Possible trace left pleural effusion. Followup PA and lateral chest X-ray is recommended in 3-4 weeks following trial of antibiotic therapy to ensure resolution and exclude underlying malignancy. Electronically Signed   By: Keith Rake M.D.   On: 07/24/2022 16:29    Procedures .Critical Care  Performed by: Kateri Plummer, PA-C Authorized by: Kateri Plummer, PA-C   Critical care provider statement:    Critical care time (minutes):  30   Critical care was necessary to treat or prevent imminent or life-threatening deterioration of the following conditions:  Respiratory failure and sepsis   Critical care was time spent personally by me on the following activities:  Development of treatment plan with patient or surrogate, discussions with consultants, evaluation of patient's response to treatment, examination of patient, ordering and review of laboratory studies, ordering and review of radiographic studies, ordering and performing treatments and interventions, pulse oximetry, re-evaluation of patient's condition and review of old charts   Care discussed with: admitting provider       Medications Ordered in ED Medications   lactated ringers infusion (has no administration in time range)  cefTRIAXone (ROCEPHIN) 2 g in sodium chloride 0.9 % 100 mL IVPB (0 g Intravenous Stopped 07/24/22 1732)  azithromycin (ZITHROMAX) 500 mg in sodium chloride 0.9 % 250 mL IVPB (500 mg Intravenous New Bag/Given 07/24/22 1800)  dextromethorphan-guaiFENesin (MUCINEX DM) 30-600 MG per 12 hr tablet 1 tablet (has no administration in time range)  albuterol (PROVENTIL) (2.5 MG/3ML) 0.083% nebulizer solution 2.5 mg (has no administration in time range)  ipratropium-albuterol (DUONEB) 0.5-2.5 (3) MG/3ML nebulizer solution 3 mL (has no administration in time range)  lactated ringers bolus 1,000 mL (1,000 mLs Intravenous New Bag/Given 07/24/22 1642)  acetaminophen (TYLENOL) tablet 1,000 mg (1,000 mg Oral Given 07/24/22 1649)  sodium chloride 0.9 % bolus 1,000 mL (1,000 mLs Intravenous New Bag/Given 07/24/22 1835)    ED Course/ Medical Decision Making/ A&P                           Medical Decision Making Amount and/or Complexity of Data Reviewed Labs: ordered. Radiology: ordered. ECG/medicine tests: ordered.  Risk OTC drugs. Prescription drug management.  This patient is a 56 y.o. female  who presents to the ED for concern of shortness of breath and cough x 1 week.   Differential diagnoses prior to evaluation: The emergent differential diagnosis includes, but is not limited to,  Upper respiratory infection, lower respiratory infection, allergies, asthma, irritants, foreign body, medications (ACE inhibitors), reflux, asthma, CHF, lung cancer, interstitial lung disease, psychiatric causes, postnasal drip. This is not an exhaustive differential.   Past Medical History / Co-morbidities: Anxiety, depression, CAD s/p stent in 2020, diabetes, fibromyalgia, GERD, hypertension, hyperlipidemia  Additional history: Chart reviewed. Pertinent results include: No history of heart failure (EF 60-65% as of echo in 2022) or lung disease  Physical  Exam: Physical exam performed. The pertinent findings include: Patient febrile to 101.38F, tachycardic to 112.  Oxygen saturation around 92% on room air with good waveform. Rales in left middle and lower lung spaces. Meets SIRS criteria.   Lab Tests/Imaging studies: I personally interpreted labs/imaging and the pertinent results include: No leukocytosis, hemoglobin stable.  Potassium 3.2, glucose 148.  Normal lactate.Marland Kitchen  Respiratory panel negative for COVID and flu.  Chest x-ray with diffuse patchy airspace disease and left mid lower lung zone with trace left pleural effusion. I agree with the radiologist interpretation.  Cardiac monitoring: EKG obtained and interpreted by my attending physician which shows: sinus tachycardia   Medications: I ordered medication including antibiotics, fluids and tylenol for fever and pneumonia.  I have reviewed the patients home medicines and have made adjustments as needed.  Consultations obtained: I consulted with hospitalist Dr Denton Brick who will admit the patient.    Disposition: After consideration of the diagnostic results and the patients response to treatment, I feel that patient would benefit from medical admission for sepsis due to pneumonia.   Final Clinical Impression(s) / ED Diagnoses Final diagnoses:  Sepsis due to pneumonia [facility])    Rx / DC Orders ED Discharge Orders     None      Portions of this report may have been transcribed using voice recognition software. Every effort was made to ensure accuracy; however, inadvertent computerized transcription errors may be present.    Estill Cotta 07/24/22 1841    Noemi Chapel, MD 07/25/22 [phone]

## 2022-07-24 NOTE — Consult Note (Addendum)
Patient Demographics:    Amy Snow, is a 56 y.o. female  MRN: [id number]   DOB - 05/02/1966  Admit Date - 07/24/2022  Outpatient Primary MD for the patient is Bucio, [place] Dragon, FNP   Assessment & Plan:   Assessment and Plan:  1)CAP--- left-sided community-acquired pneumonia -Chest x-ray with left-sided pneumonia and effusion -WBC is 10.0 lactic acid is 1.8 -COVID and flu test negative -Tachycardia is probably due to noncompliance with carvedilol patient was on 25 mg twice daily prior to coming to the ED, she Had missed at least 2 doses -Patient met sepsis criteria on admission due to fevers tachycardia tachypnea with left-sided pneumonia -She received IV fluids per sepsis protocol -She received IV Rocephin and azithromycin -Discharge home on bronchodilators, mucolytic's and Omnicef and azithromycin -Repeat chest x-ray in 3 to 4 weeks with PCP advised --I Ambulated the patient with O2 sats of 91 to 93% on room air with post ambulation -Patient recently (about 3 weeks ago or so) had dental extraction of all her top teeth   2)History of CAD with prior angioplasty and stent placement----continue aspirin, Plavix, atorvastatin, restart Coreg -Okay to hold lisinopril, isosorbide and HCTZ for 24 to 48 hours due to soft BP concerns  3)Depression/Anxiety--- resume PTA meds including Elavil, Zoloft, Topamax and trazodone  4)DM2-last available A1c was 9.8 reflecting uncontrolled DM with hyperglycemia -No Recent A1c available -Continue metformin and insulin follow-up with PCP for further adjustments  5)History of ischemic Cardiomyopathy---  EF is improved Echo from June 2022 showed EF was up to 60 to 81%---LXBW grade 1 diastolic dysfunction -Medication adjustments as above #2 -Patient does not appear volume overloaded at  this time  6) hypokalemia--- due to HCTZ use, replaced    Disposition--discharge home in stable condition chest pain-free without hypoxia or significant dyspnea - Discharge instructions reviewed discussed with EDP-- 1) please hold hydrochlorothiazide/HCTZ, isosorbide and lisinopril until Monday, 07/26/2022--- please recheck your blood pressure prior to restarting the 3 medications listed above  2) you are taking aspirin and clopidogrel/Plavix--which are Both blood thinners, so Please Avoid ibuprofen/Advil/Aleve/Motrin/Goody Powders/Naproxen/BC powders/Meloxicam/Diclofenac/Indomethacin and other Nonsteroidal anti-inflammatory medications as these will make you more likely to bleed and can cause stomach ulcers, can also cause Kidney problems.   3) please follow-up to primary care physician in 3 to 4 weeks for repeat chest x-ray to make sure that the findings of the chest x-ray has resolved.... At times there may be other things other than a pneumonia on your chest x-ray that might need further attention  4) your potassium is low most likely due to HCTZ/hydrochlorothiazide use--- potassium supplements have been prescribed for you to be taking only while taking hydrochlorothiazide/HCTZ  Dispo: The patient is from: Home              Anticipated d/c is to: Home  Allergies as of 07/24/2022   No Known Allergies      Medication List  STOP taking these medications    ibuprofen 800 MG tablet Commonly known as: ADVIL       TAKE these medications    acetaminophen 650 MG CR tablet Commonly known as: TYLENOL Take 650-1,300 mg by mouth every 8 (eight) hours as needed for pain.   albuterol 108 (90 Base) MCG/ACT inhaler Commonly known as: VENTOLIN HFA Inhale 2 puffs into the lungs every 4 (four) hours as needed for wheezing or shortness of breath (cough). What changed:  how much to take how to take this reasons to take this   amitriptyline 50 MG tablet Commonly known as: ELAVIL Take  50 mg by mouth at bedtime.   aspirin 81 MG chewable tablet Chew 1 tablet (81 mg total) by mouth daily.   atorvastatin 80 MG tablet Commonly known as: LIPITOR Take 1 tablet (80 mg total) by mouth daily.   azithromycin 500 MG tablet Commonly known as: ZITHROMAX Take 1 tablet (500 mg total) by mouth daily for 5 days.   carvedilol 25 MG tablet Commonly known as: COREG Take 1 tablet (25 mg total) by mouth 2 (two) times daily.   cefdinir 300 MG capsule Commonly known as: OMNICEF Take 1 capsule (300 mg total) by mouth 2 (two) times daily for 5 days.   clopidogrel 75 MG tablet Commonly known as: PLAVIX Take 1 tablet (75 mg total) by mouth daily.   dextromethorphan-guaiFENesin 30-600 MG 12hr tablet Commonly known as: MUCINEX DM Take 1 tablet by mouth 2 (two) times daily.   hydrochlorothiazide 12.5 MG capsule Commonly known as: MICROZIDE TAKE 1 CAPSULE BY MOUTH ONCE DAILY-TAKE AN EXTRA CAPSULE DAILY AS NEEDED FOR WEIGHT GAIN OF 3 LBS IN ONE DAY OR 5 LBS IN ONE WEEK Start taking on: July 26, 2022 What changed: These instructions start on July 26, 2022. If you are unsure what to do until then, ask your doctor or other care provider.   insulin NPH-regular Human (70-30) 100 UNIT/ML injection Inject 80 Units into the skin 2 (two) times a day.   isosorbide mononitrate 60 MG 24 hr tablet Commonly known as: IMDUR Take 1 tablet (60 mg total) by mouth daily. Start taking on: July 26, 2022 What changed: These instructions start on July 26, 2022. If you are unsure what to do until then, ask your doctor or other care provider.   lisinopril 20 MG tablet Commonly known as: ZESTRIL Take 1 tablet (20 mg total) by mouth daily. Start taking on: July 26, 2022 What changed: These instructions start on July 26, 2022. If you are unsure what to do until then, ask your doctor or other care provider.   meclizine 12.5 MG tablet Commonly known as: ANTIVERT Take 12.5 mg by mouth  every 6 (six) hours as needed.   metFORMIN 500 MG tablet Commonly known as: GLUCOPHAGE Take 500 mg by mouth 2 (two) times daily.   nitroGLYCERIN 0.4 MG SL tablet Commonly known as: NITROSTAT Place 1 tablet (0.4 mg total) under the tongue every 5 (five) minutes x 3 doses as needed for chest pain.   ondansetron 4 MG tablet Commonly known as: ZOFRAN Take 4 mg by mouth every 8 (eight) hours as needed.   pantoprazole 40 MG tablet Commonly known as: PROTONIX Take 1 tablet by mouth once daily   potassium chloride 10 MEQ tablet Commonly known as: KLOR-CON Take 1 tablet (10 mEq total) by mouth daily. Take While taking hydrochlorothiazide/HCTZ   sertraline 100 MG tablet Commonly known as: ZOLOFT Take 150 mg by mouth  at bedtime.   topiramate 50 MG tablet Commonly known as: TOPAMAX Take 50 mg by mouth 2 (two) times daily.   traZODone 50 MG tablet Commonly known as: DESYREL Take 50 mg by mouth at bedtime.         With History of - Reviewed by me  Past Medical History:  Diagnosis Date   Anxiety and depression    CAD (coronary artery disease)    a. s/p STEMI 2016 s/p DES x 2 PDA with residual dz treated medically. b. [place] in 2020 with cath s/p DES to long high grade lesion in LAD, residual disease treated medically, EF 50%.   Diabetes mellitus, type II ([place])    Family history of adverse reaction to anesthesia    PONV   Fibromyalgia    GERD (gastroesophageal reflux disease)    Hyperlipidemia    Hypertension    Ischemic cardiomyopathy    a. EF <25% in 2016, improved to normal thereafter. b. EF 50% in 05/2019.   Myocardial infarct [facility])    august 2016      Past Surgical History:  Procedure Laterality Date   BIOPSY  08/15/2020   Procedure: BIOPSY;  Surgeon: Harvel Quale, MD;  [facility];  Service: Gastroenterology;;   CARDIAC CATHETERIZATION N/A 07/05/2015   Procedure: Left Heart Cath and Coronary Angiography;  Surgeon: Lorretta Harp, MD;   Location: [place] [facility];  Service: Cardiovascular;  Laterality: N/A;   CESAREAN SECTION     COLONOSCOPY WITH PROPOFOL N/A 08/15/2020   Procedure: COLONOSCOPY WITH PROPOFOL;  Surgeon: Harvel Quale, MD;  [facility];  Service: Gastroenterology;  Laterality: N/A;  1230   CORONARY STENT INTERVENTION N/A 05/24/2019   Procedure: CORONARY STENT INTERVENTION;  Surgeon: Lorretta Harp, MD;  Location: [place] [facility];  Service: Cardiovascular;  Laterality: N/A;   ESOPHAGEAL DILATION N/A 08/15/2020   Procedure: ESOPHAGEAL DILATION;  Surgeon: Harvel Quale, MD;  [facility];  Service: Gastroenterology;  Laterality: N/A;   ESOPHAGOGASTRODUODENOSCOPY (EGD) WITH PROPOFOL N/A 08/15/2020   Procedure: ESOPHAGOGASTRODUODENOSCOPY (EGD) WITH PROPOFOL;  Surgeon: Harvel Quale, MD;  [facility];  Service: Gastroenterology;  Laterality: N/A;   HERNIA REPAIR Left    x2 ventral and inguinal   LEFT HEART CATH AND CORONARY ANGIOGRAPHY N/A 05/24/2019   Procedure: LEFT HEART CATH AND CORONARY ANGIOGRAPHY;  Surgeon: Lorretta Harp, MD;  Location: [place] [facility];  Service: Cardiovascular;  Laterality: N/A;   LEFT HEART CATH AND CORONARY ANGIOGRAPHY N/A 05/05/2021   Procedure: LEFT HEART CATH AND CORONARY ANGIOGRAPHY;  Surgeon: Troy Sine, MD;  Location: [place] [facility];  Service: Cardiovascular;  Laterality: N/A;   POLYPECTOMY  08/15/2020   Procedure: POLYPECTOMY;  Surgeon: Montez Morita, Quillian Quince, MD;  [facility];  Service: Gastroenterology;;   TONSILLECTOMY     TUBAL LIGATION      Chief Complaint  Patient presents with   Cough      HPI:    Amy Snow  is a 56 y.o. 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 Cough according to patient sounds  wet but is not really productive -No frank chest pains no palpitations no dizziness no shortness of breath -Patient was found to be tachycardic in the ED apparently she has not taking the Coreg for over 24 hours -I Ambulated the patient with O2 sats of 91 to 93% on room air with post ambulation -Patient recently (about 3 weeks ago or so) had dental extraction of all her top teeth  -No leg swelling no leg pains , no chest pains , no pleuritic symptoms -No vomiting or diarrhea,  -EKG with sinus tachycardia with old anterior infarction -COVID and flu negative -Chest x-ray with left-sided pneumonia and effusion -WBC is 10.0 lactic acid is 1.8 -COVID and flu test negative -Potassium 3.2 replaced... Creatinine 0.96, LFTs are not elevated    Review of systems:    In addition to the HPI above,   A full Review of  Systems was done, all other systems reviewed are negative except as noted above in HPI , .    Social History:  Reviewed by me    Social History   Tobacco Use   Smoking status: Never   Smokeless tobacco: Never  Substance Use Topics   Alcohol use: Yes    Comment: occasionally     Family History :  Reviewed by me    Family History  Problem Relation Age of Onset   Alcohol abuse Mother    Anxiety disorder Father    Depression Father    Alcohol abuse Father    Anxiety disorder Sister    Depression Sister    Depression Maternal Aunt    Depression Cousin      Home Medications:   Prior to Admission medications   Medication Sig Start Date End Date Taking? Authorizing Provider  azithromycin (ZITHROMAX) 500 MG tablet Take 1 tablet (500 mg total) by mouth daily for 5 days. 07/24/22 07/29/22 Yes Emokpae, Courage, MD  cefdinir (OMNICEF) 300 MG capsule Take 1 capsule (300 mg total) by mouth 2 (two)  times daily for 5 days. 07/24/22 07/29/22 Yes Emokpae, Courage, MD  potassium chloride (KLOR-CON) 10 MEQ tablet Take 1 tablet (10 mEq total) by mouth daily. Take While taking hydrochlorothiazide/HCTZ 07/24/22  Yes Roxan Hockey, MD  acetaminophen (TYLENOL) 650 MG CR tablet Take 650-1,300 mg by mouth every 8 (eight) hours as needed for pain.     [provider]  albuterol (VENTOLIN HFA) 108 (90 Base) MCG/ACT inhaler Inhale 2 puffs into the lungs every 4 (four) hours as needed for wheezing or shortness of breath (cough). 07/24/22   Roxan Hockey, MD  amitriptyline (ELAVIL) 50 MG tablet Take 50 mg by mouth at bedtime. 06/15/21   [provider]  aspirin 81 MG chewable tablet Chew 1 tablet (81 mg total) by mouth daily. 05/15/19   Barrett, Evelene Croon, PA-C  atorvastatin (LIPITOR) 80 MG tablet Take 1 tablet (80 mg total) by mouth daily. 05/07/21   Kroeger, Lorelee Cover., PA-C  carvedilol (COREG) 25 MG tablet Take 1 tablet (25 mg total) by mouth 2 (two) times daily. 05/06/21   Kroeger, Lorelee Cover., PA-C  clopidogrel (PLAVIX) 75 MG tablet Take 1 tablet (75 mg total) by mouth daily. 05/07/21   Kroeger, Lorelee Cover., PA-C  dextromethorphan-guaiFENesin [facility] DM) 30-600 MG 12hr tablet Take 1 tablet by mouth 2 (two) times daily. 07/24/22   Roxan Hockey, MD  hydrochlorothiazide (MICROZIDE) 12.5 MG capsule TAKE 1 CAPSULE BY MOUTH ONCE DAILY-TAKE AN EXTRA CAPSULE DAILY AS NEEDED FOR WEIGHT GAIN OF 3 LBS IN  ONE DAY OR 5 LBS IN ONE WEEK 07/26/22   Roxan Hockey, MD  insulin NPH-regular Human (70-30) 100 UNIT/ML injection Inject 80 Units into the skin 2 (two) times a day.     [provider]  isosorbide mononitrate (IMDUR) 60 MG 24 hr tablet Take 1 tablet (60 mg total) by mouth daily. 07/26/22   Roxan Hockey, MD  lisinopril (ZESTRIL) 20 MG tablet Take 1 tablet (20 mg total) by mouth daily. 07/26/22   Roxan Hockey, MD  meclizine (ANTIVERT) 12.5 MG tablet Take 12.5 mg by mouth every 6 (six) hours as  needed. 05/26/21   [provider]  metFORMIN (GLUCOPHAGE) 500 MG tablet Take 500 mg by mouth 2 (two) times daily. 06/15/21   [provider]  nitroGLYCERIN (NITROSTAT) 0.4 MG SL tablet Place 1 tablet (0.4 mg total) under the tongue every 5 (five) minutes x 3 doses as needed for chest pain. 05/06/21   Kroeger, Lorelee Cover., PA-C  ondansetron (ZOFRAN) 4 MG tablet Take 4 mg by mouth every 8 (eight) hours as needed. 05/26/21   [provider]  pantoprazole (PROTONIX) 40 MG tablet Take 1 tablet by mouth once daily 05/19/22   Hilty, Nadean Corwin, MD  sertraline (ZOLOFT) 100 MG tablet Take 150 mg by mouth at bedtime.  01/09/20   [provider]  topiramate (TOPAMAX) 50 MG tablet Take 50 mg by mouth 2 (two) times daily. 06/15/21   [provider]  traZODone (DESYREL) 50 MG tablet Take 50 mg by mouth at bedtime.    [provider]     Allergies:    No Known Allergies   Physical Exam:   Vitals  Blood pressure 125/67, pulse (!) 102, temperature 99.6 F (37.6 C), temperature source Oral, resp. rate (!) 30, height 4' 11.75" (1.518 m), weight 68 kg, last menstrual period 12/05/2015, SpO2 95 %.  Physical Examination: General appearance - alert,  in no distress, no conversational dyspnea Mental status - alert, oriented to person, place, and time,  Eyes - sclera anicteric Neck - supple, no JVD elevation , Chest -diminished breath sounds on the left with scattered rhonchi  heart - S1 and S2 normal, regular , tachy (did not take coreg) Abdomen - soft, nontender, nondistended, +BS Neurological - screening mental status exam normal, neck supple without rigidity, cranial nerves II through XII intact, DTR's normal and symmetric Extremities - no pedal edema noted, intact peripheral pulses  Skin - warm, dry     Data Review:    CBC Recent Labs  Lab 07/24/22 1626  WBC 10.0  HGB 11.2*  HCT 35.6*  PLT 206  MCV 82.8  MCH 26.0  MCHC 31.5  RDW 15.1  LYMPHSABS  0.7  MONOABS 0.6  EOSABS 0.1  BASOSABS 0.0   ------------------------------------------------------------------------------------------------------------------  Chemistries  Recent Labs  Lab 07/24/22 1626  NA 141  K 3.2*  CL 111  CO2 22  GLUCOSE 148*  BUN 25*  CREATININE 0.96  CALCIUM 8.9  AST 16  ALT 6  ALKPHOS 70  BILITOT 0.6   ------------------------------------------------------------------------------------------------------------------ estimated creatinine clearance is 57.2 mL/min (by C-G formula based on SCr of 0.96 mg/dL). ------------------------------------------------------------------------------------------------------------------ Coagulation profile Recent Labs  Lab 07/24/22 1626  INR 1.1   ------------------------------------------------------------------------------------------------------------------    Component Value Date/Time   BNP 274.0 (H) 07/06/2015 0418     Imaging Results:    DG Chest 2 View  Result Date: 07/24/2022 CLINICAL DATA:  Cough since last week.  Fever.  Fatigue. EXAM: CHEST - 2  VIEW COMPARISON:  05/04/2021 FINDINGS: Diffuse patchy airspace disease in the left mid lower lung zone typical of pneumonia. There may be a trace left pleural effusion. The right lung is clear. Normal heart size and mediastinal contours. No pneumothorax. No acute osseous findings. IMPRESSION: Diffuse patchy airspace disease in the left mid lower lung zone typical of pneumonia. Possible trace left pleural effusion. Followup PA and lateral chest X-ray is recommended in 3-4 weeks following trial of antibiotic therapy to ensure resolution and exclude underlying malignancy. Electronically Signed   By: Keith Rake M.D.   On: 07/24/2022 16:29    Radiological Exams on Admission: DG Chest 2 View  Result Date: 07/24/2022 CLINICAL DATA:  Cough since last week.  Fever.  Fatigue. EXAM: CHEST - 2 VIEW COMPARISON:  05/04/2021 FINDINGS: Diffuse patchy airspace disease in  the left mid lower lung zone typical of pneumonia. There may be a trace left pleural effusion. The right lung is clear. Normal heart size and mediastinal contours. No pneumothorax. No acute osseous findings. IMPRESSION: Diffuse patchy airspace disease in the left mid lower lung zone typical of pneumonia. Possible trace left pleural effusion. Followup PA and lateral chest X-ray is recommended in 3-4 weeks following trial of antibiotic therapy to ensure resolution and exclude underlying malignancy. Electronically Signed   By: Keith Rake M.D.   On: 07/24/2022 16:29     Condition  -stable  Roxan Hockey M.D on 07/24/2022 at 7:05 PM Go to [email or web address] -  for contact info  [facility] - Office  [phone]

## 2022-07-24 NOTE — Sepsis Progress Note (Addendum)
Elink following code sepsis  1550 ordering provider contacted, will order second set of blood cultures, bedside RN confirmed 2 sets were drawn at start of code sepsis, encouraged bedside RN to contact lab to let them know there was now a second lab for blood cultures that had already been drawn.

## 2022-07-24 NOTE — ED Triage Notes (Signed)
Pt to er, pt states that she is here for a cough that she has had since last week, states that she feels very fatigued, pt has wet cough. Pt reports a fever at home as well.

## 2022-07-29 LAB — CULTURE, BLOOD (SINGLE)
Culture: NO GROWTH
Culture: NO GROWTH
Special Requests: ADEQUATE
Special Requests: ADEQUATE

## 2022-08-04 ENCOUNTER — Ambulatory Visit: Payer: 59 | Admitting: Orthopedic Surgery

## 2022-08-04 ENCOUNTER — Encounter: Payer: Self-pay | Admitting: *Deleted

## 2022-08-04 ENCOUNTER — Encounter: Payer: Self-pay | Admitting: Orthopedic Surgery

## 2022-08-04 VITALS — Ht 59.75 in | Wt 150.0 lb

## 2022-08-04 DIAGNOSIS — M25512 Pain in left shoulder: Secondary | ICD-10-CM | POA: Diagnosis not present

## 2022-08-04 DIAGNOSIS — G8929 Other chronic pain: Secondary | ICD-10-CM

## 2022-08-04 DIAGNOSIS — Z006 Encounter for examination for normal comparison and control in clinical research program: Secondary | ICD-10-CM

## 2022-08-04 MED ORDER — METHYLPREDNISOLONE ACETATE 40 MG/ML IJ SUSP
40.0000 mg | Freq: Once | INTRAMUSCULAR | Status: AC
  Administered 2022-08-04: 40 mg via INTRA_ARTICULAR

## 2022-08-04 NOTE — Research (Signed)
ICF updated and copy sent to pt.

## 2022-08-04 NOTE — Addendum Note (Signed)
Addended by: Elizabeth Sauer on: 08/04/2022 12:02 PM   Modules accepted: Orders

## 2022-08-04 NOTE — Progress Notes (Signed)
  Return patient Visit  Assessment: Amy Snow is a 56 y.o. female with the following: 1. Chronic pain of left shoulder  Plan: Amy Snow has pain in both shoulders.  We recently injected her right shoulder, and she has had an excellent response.  She would like to proceed with a left shoulder injection.  This was completed in clinic today without issues.  Follow-up as needed.   Procedure note injection -left shoulder    Verbal consent was obtained to inject the left shoulder, subacromial space Timeout was completed to confirm the site of injection.   The skin was prepped with alcohol and ethyl chloride was sprayed at the injection site.  A 21-gauge needle was used to inject 40 mg of Depo-Medrol and 1% lidocaine (3 cc) into the subacromial space of the left shoulder using a posterolateral approach.  There were no complications.  A sterile bandage was applied.    Follow-up: Return if symptoms worsen or fail to improve.  Subjective:  Chief Complaint  Patient presents with   Shoulder Pain    Lt shoulder pain wants injection    History of Present Illness: Amy Snow is a 56 y.o. female who returns for evaluation of bilateral shoulder pain.  Her right shoulder feels a lot better after the most recent injection.  As result, she would like to proceed with an injection in her left shoulder.  She has restricted range of motion of the left shoulder, with pain in the posterior lateral aspect of the shoulder.  Pain gets worse at night.   Review of Systems: No fevers or chills No numbness or tingling No chest pain No shortness of breath No bowel or bladder dysfunction No GI distress No headaches Objective: Ht 4' 11.75" (1.518 m)   Wt 150 lb (68 kg)   LMP 12/05/2015   BMI 29.54 kg/m   Physical Exam:  General: Alert and oriented. and No acute distress. Gait: Normal gait.  Left shoulder without deformity.  Forward flexion is limited to 100 degrees.  4+/5  strength.  Positive Jobe's.  Negative belly press.  Fingers are warm and well-perfused.  IMAGING: I personally ordered and reviewed the following images  No new imaging obtained today.   New Medications:  No orders of the defined types were placed in this encounter.     Amy Rasmussen, MD  08/04/2022 11:57 AM

## 2022-08-10 ENCOUNTER — Other Ambulatory Visit: Payer: Self-pay | Admitting: Internal Medicine

## 2022-08-13 ENCOUNTER — Other Ambulatory Visit: Payer: Self-pay | Admitting: Internal Medicine

## 2022-08-17 ENCOUNTER — Telehealth: Payer: Self-pay | Admitting: Orthopedic Surgery

## 2022-08-17 DIAGNOSIS — G8929 Other chronic pain: Secondary | ICD-10-CM

## 2022-08-17 NOTE — Telephone Encounter (Signed)
Call from patient received - relays that the injection done on 08/04/22 for left shoulder did not seem to help this time. Please advise if another appointment is needed, or of other recommendation- patient ph# [phone]

## 2022-08-24 NOTE — Addendum Note (Signed)
Addended by: Elizabeth Sauer on: 08/24/2022 09:54 AM   Modules accepted: Orders

## 2022-08-24 NOTE — Telephone Encounter (Signed)
Pt would like to try the guided injection at this time. Order placed and pt given # for scheduling.

## 2022-09-09 ENCOUNTER — Ambulatory Visit (HOSPITAL_COMMUNITY)
Admission: RE | Admit: 2022-09-09 | Discharge: 2022-09-09 | Disposition: A | Discharge status: Home / Self Care | Payer: 59 | Source: Ambulatory Visit | Attending: Orthopedic Surgery | Admitting: Orthopedic Surgery

## 2022-09-09 ENCOUNTER — Encounter (HOSPITAL_COMMUNITY): Payer: Self-pay

## 2022-09-09 DIAGNOSIS — G8929 Other chronic pain: Secondary | ICD-10-CM | POA: Insufficient documentation

## 2022-09-09 DIAGNOSIS — M25512 Pain in left shoulder: Secondary | ICD-10-CM | POA: Diagnosis not present

## 2022-09-09 MED ORDER — BUPIVACAINE HCL (PF) 0.5 % IJ SOLN
INTRAMUSCULAR | Status: AC
  Administered 2022-09-09: 5 mL
  Filled 2022-09-09: qty 30

## 2022-09-09 MED ORDER — METHYLPREDNISOLONE ACETATE 40 MG/ML IJ SUSP
INTRAMUSCULAR | Status: AC
  Administered 2022-09-09: 40 mg via INTRAMUSCULAR
  Filled 2022-09-09: qty 1

## 2022-09-09 MED ORDER — POVIDONE-IODINE 10 % EX SOLN
CUTANEOUS | Status: AC
  Administered 2022-09-09: 1
  Filled 2022-09-09: qty 14.8

## 2022-09-09 MED ORDER — IOHEXOL 180 MG/ML  SOLN
INTRAMUSCULAR | Status: AC
  Administered 2022-09-09: 15 mL
  Filled 2022-09-09: qty 20

## 2022-09-09 MED ORDER — LIDOCAINE HCL (PF) 1 % IJ SOLN
INTRAMUSCULAR | Status: AC
  Administered 2022-09-09: 5 mL
  Filled 2022-09-09: qty 5

## 2022-09-10 ENCOUNTER — Other Ambulatory Visit (HOSPITAL_COMMUNITY): Payer: 59

## 2022-10-07 ENCOUNTER — Other Ambulatory Visit: Payer: Self-pay | Admitting: Medical

## 2022-10-10 NOTE — Progress Notes (Unsigned)
Cardiology Office Note:    Date:  10/13/2022   ID:  Amy Snow, DOB [birth year], MRN [id number]  PCP:  Olga Coaster, [place] [facility] Cardiologist:  None     Referring MD: Olga Coaster, FNP   CC: Here for follow-up  History of Present Illness:    Amy Snow is a 56 y.o. female with a hx of the following  CAD, s/p STEMI and s/p drug-eluting stent x 2 to PDA with medical treatment for residual disease in 2016 Hyperlipidemia Hypertension Type 2 diabetes Mild anemia Fibromyalgia Anxiety   Previous cardiovascular history includes STEMI in 2016 with drug-eluting stent x 2 to PDA.  In 2020 she had an office visit that was concerning for progressive angina, underwent heart catheterization and received PCI/DES to LAD with medical treatment for PDA/PL disease.  Echo revealed 55 to 60% EF.  In 2021 she contacted nurse triage line and noted chest tightness, shortness of breath, was advised to go to [facility] ED for evaluation.  She went to [facility] ED that same day and was ruled out for ACS/MI and was discharged home due to no EKG changes indicating ischemia, negative troponins.  Chest x-ray revealed low lung volumes, negative for anything acute.  Was discharged home.  Last seen by Levell July, NP on February 18, 2020 and described mild dyspnea and chest tightness during exertion at work.  Denied any associated symptoms or radiation of chest pain.  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  Blood pressure on arrival was 188/90, lisinopril was increased to 20 mg daily, was advised to get a new BP cuff to monitor at home.  Was told to follow-up in 1 month, however has not been seen in the cardiology office since.  Today she presents for overdue follow-up appointment. 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 Said she has had hx of cortisone injections in both arms, said recent injection in left arm did not help. Has a chronic history of fibromyalgia, does note chronic pain with this. Does take Ibuprofen to help with this.  Denies any anginal-like chest pain like when she had her MI in 2016, shortness of breath, palpitations, syncope, presyncope, dizziness, orthopnea, PND, swelling, significant weight changes, acute bleeding, or claudication.  Blood pressure is well-controlled at home.  Does note some history of vertigo and admits to memory issues for the past 4 to 5 months and wants to know what can be done for this.  Denies any other questions or concerns today.  Past Medical History:  Diagnosis Date   Anxiety and depression    CAD (coronary artery disease)    a. s/p STEMI 2016 s/p DES x 2 PDA with residual dz treated medically. b. [place] in 2020 with cath s/p DES to long high grade lesion in LAD, residual disease treated medically, EF 50%.   Diabetes mellitus, type II ([place])    Family history of adverse reaction to anesthesia    PONV   Fibromyalgia    GERD (gastroesophageal reflux disease)    Hyperlipidemia    Hypertension    Ischemic cardiomyopathy    a. EF <25% in 2016, improved to normal thereafter. b. EF 50% in 05/2019.   Myocardial infarct [facility])    august 2016  Past Surgical History:  Procedure Laterality Date   BIOPSY  08/15/2020   Procedure: BIOPSY;  Surgeon: Harvel Quale, MD;  [facility];  Service: Gastroenterology;;   CARDIAC CATHETERIZATION N/A 07/05/2015   Procedure: Left Heart Cath and Coronary Angiography;  Surgeon: Lorretta Harp, MD;  Location: [place] [facility];  Service: Cardiovascular;  Laterality: N/A;   CESAREAN SECTION     COLONOSCOPY WITH PROPOFOL N/A 08/15/2020   Procedure: COLONOSCOPY WITH PROPOFOL;  Surgeon: Harvel Quale, MD;  [facility]: [facility];  Service: Gastroenterology;   Laterality: N/A;  1230   CORONARY STENT INTERVENTION N/A 05/24/2019   Procedure: CORONARY STENT INTERVENTION;  Surgeon: Lorretta Harp, MD;  Location: [place] [facility];  Service: Cardiovascular;  Laterality: N/A;   ESOPHAGEAL DILATION N/A 08/15/2020   Procedure: ESOPHAGEAL DILATION;  Surgeon: Harvel Quale, MD;  [facility];  Service: Gastroenterology;  Laterality: N/A;   ESOPHAGOGASTRODUODENOSCOPY (EGD) WITH PROPOFOL N/A 08/15/2020   Procedure: ESOPHAGOGASTRODUODENOSCOPY (EGD) WITH PROPOFOL;  Surgeon: Harvel Quale, MD;  [facility];  Service: Gastroenterology;  Laterality: N/A;   HERNIA REPAIR Left    x2 ventral and inguinal   LEFT HEART CATH AND CORONARY ANGIOGRAPHY N/A 05/24/2019   Procedure: LEFT HEART CATH AND CORONARY ANGIOGRAPHY;  Surgeon: Lorretta Harp, MD;  Location: [place] [facility];  Service: Cardiovascular;  Laterality: N/A;   LEFT HEART CATH AND CORONARY ANGIOGRAPHY N/A 05/05/2021   Procedure: LEFT HEART CATH AND CORONARY ANGIOGRAPHY;  Surgeon: Troy Sine, MD;  Location: [place] [facility];  Service: Cardiovascular;  Laterality: N/A;   POLYPECTOMY  08/15/2020   Procedure: POLYPECTOMY;  Surgeon: Montez Morita, Quillian Quince, MD;  [facility];  Service: Gastroenterology;;   TONSILLECTOMY     TUBAL LIGATION      Current Medications: Current Meds  Medication Sig   acetaminophen (TYLENOL) 650 MG CR tablet Take 650-1,300 mg by mouth every 8 (eight) hours as needed for pain.    albuterol (VENTOLIN HFA) 108 (90 Base) MCG/ACT inhaler Inhale 2 puffs into the lungs every 4 (four) hours as needed for wheezing or shortness of breath (cough).   amitriptyline (ELAVIL) 50 MG tablet Take 50 mg by mouth at bedtime.   aspirin 81 MG chewable tablet Chew 1 tablet (81 mg total) by mouth daily.   atorvastatin (LIPITOR) 80 MG tablet Take 1 tablet (80 mg total) by mouth daily.   carvedilol (COREG) 25 MG tablet Take 1 tablet (25 mg  total) by mouth 2 (two) times daily.   clopidogrel (PLAVIX) 75 MG tablet Take 1 tablet (75 mg total) by mouth daily.   dextromethorphan-guaiFENesin (MUCINEX DM) 30-600 MG 12hr tablet Take 1 tablet by mouth 2 (two) times daily.   hydrochlorothiazide (MICROZIDE) 12.5 MG capsule TAKE 1 CAPSULE BY MOUTH ONCE DAILY-TAKE AN EXTRA CAPSULE DAILY AS NEEDED FOR WEIGHT GAIN OF 3 LBS IN ONE DAY OR 5 LBS IN ONE WEEK   insulin NPH-regular Human (70-30) 100 UNIT/ML injection Inject 80 Units into the skin 2 (two) times a day.    isosorbide mononitrate (IMDUR) 60 MG 24 hr tablet Take 1 tablet (60 mg total) by mouth daily.   lisinopril (ZESTRIL) 20 MG tablet Take 1 tablet (20 mg total) by mouth daily.   meclizine (ANTIVERT) 12.5 MG tablet Take 12.5 mg by mouth every 6 (six) hours as needed.   metFORMIN (GLUCOPHAGE) 500 MG tablet Take 500 mg by mouth 2 (two) times daily.  nitroGLYCERIN (NITROSTAT) 0.4 MG SL tablet Place 1 tablet (0.4 mg total) under the tongue every 5 (five) minutes x 3 doses as needed for chest pain.   ondansetron (ZOFRAN) 4 MG tablet Take 4 mg by mouth every 8 (eight) hours as needed.   pantoprazole (PROTONIX) 40 MG tablet Take 1 tablet by mouth once daily   potassium chloride (KLOR-CON) 10 MEQ tablet Take 1 tablet (10 mEq total) by mouth daily. Take While taking hydrochlorothiazide/HCTZ   sertraline (ZOLOFT) 100 MG tablet Take 150 mg by mouth at bedtime.    topiramate (TOPAMAX) 50 MG tablet Take 50 mg by mouth 2 (two) times daily.   traZODone (DESYREL) 50 MG tablet Take 50 mg by mouth at bedtime.     Allergies:   Patient has no known allergies.   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  She has two children 19 & 26 as well as three grandchildren.  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     Social Determinants of Health   Financial Resource Strain: Not on file  Food Insecurity: Not on file  Transportation Needs: Not on file  Physical Activity: Not on file  Stress: Not on file  Social Connections: Not on file     Family History: The patient's family history includes Alcohol abuse in her father and mother; Anxiety disorder in her father and sister; Depression in her cousin, father, maternal aunt, and sister.  ROS:   Review of Systems  Constitutional: Negative.   HENT: Negative.    Eyes: Negative.   Respiratory: Negative.    Cardiovascular: Negative.   Gastrointestinal: Negative.   Genitourinary: Negative.   Musculoskeletal:  Positive for joint pain. Negative for back pain, falls, myalgias and neck pain.  Skin: Negative.   Neurological:  Positive for dizziness. Negative for tingling, tremors, sensory change, speech change, focal weakness, seizures, loss of consciousness, weakness and headaches.  Endo/Heme/Allergies: Negative.   Psychiatric/Behavioral:  Positive for memory loss. Negative for depression, hallucinations, substance abuse and suicidal ideas. The patient is not nervous/anxious and does not have insomnia.     Please see the history of present illness.    All other systems reviewed and are negative.  EKGs/Labs/Other Studies Reviewed:    The following studies were reviewed today:   EKG:  EKG is not ordered today.   2D complete echo on May 06, 2021:  1. Left ventricular ejection fraction, by estimation, is 60 to 65%. The  left ventricle has normal function. The  left ventricle has no regional  wall motion abnormalities. There is moderate concentric left ventricular  hypertrophy. Left ventricular  diastolic parameters are consistent with Grade I diastolic dysfunction  (impaired relaxation).   2. Right ventricular systolic function is normal. The right ventricular  size  is normal. Tricuspid regurgitation signal is inadequate for assessing  PA pressure.   3. The mitral valve is grossly normal. No evidence of mitral valve  regurgitation. No evidence of mitral stenosis.   4. The aortic valve is tricuspid. There is mild calcification of the  aortic valve. There is mild thickening of the aortic valve. Aortic valve  regurgitation is not visualized. Mild aortic valve sclerosis is present,  with no evidence of aortic valve  stenosis.   5. The inferior vena cava is dilated in size with >50% respiratory  variability, suggesting right atrial pressure of 8 mmHg.   Comparison(s): A prior study was performed on 06/14/2019. No significant  change from prior study. Prior images reviewed side by side.  Left heart cath on May 05, 2021: Previously placed RPDA stent (unknown type) is widely patent. Prox RCA lesion is 30% stenosed. Mid RCA lesion is 30% stenosed. Previously placed Prox LAD to Mid LAD stent (unknown type) is widely patent. 1st Mrg lesion is 80% stenosed. Prox Cx to Mid Cx lesion is 70% stenosed. Mid Cx to Dist Cx lesion is 80% stenosed. 2nd RPL lesion is 30% stenosed.   Widely patent previously placed stents in the proximal to mid LAD and PDA of the right coronary artery.  The LAD is otherwise normal and the RCA has mild nonobstructive 30% proximal and mid smooth stenosis.   The left circumflex vessel is very  small caliber with improvement in prior stenoses from 95 and 90% to 70% and 80%.   Normal LV function with EF estimated 55 to 60% without focal segmental wall motion abnormality.  LVEDP 12 mmHg.   RECOMMENDATION: Continue DAPT with  aspirin/Plavix.   Resumption of high potency statin therapy with atorvastatin 80 mg and  add Vascepa 2 capsules twice a day and fenofibrate with marked triglyceride elevation of 851.  We will increase medical therapy with isosorbide mononitrate to 60 mg and carvedilol recently increased to 25 mg twice a day.  Plan initial medical management unless recurrent symptomatology.  Left heart cath on May 24, 2019: Successful approximately PCI and drug-eluting stenting of a long high-grade lesion probably responsible for her accelerated symptoms over the last several months.  The circumflex was a relatively small vessel with long segmental disease.  The right was a large vessel with a patent PDA stent and some moderate disease in the mid PDA and PLA.  LV function was preserved although there was mild inferoapical hypokinesia with a visual EF estimated at approximate 50%.  Patient will need uninterrupted dual antiplatelet platelet therapy for 1 year along with risk factor modification.  She will be gently hydrated overnight and discharged home in the morning.   Myoview on August 20, 2015: The left ventricular ejection fraction is normal (55-65%). Nuclear stress EF: 61%. There was no ST segment deviation noted during stress. This is a low risk study. Findings consistent with ischemia.   Low risk stress nuclear study with a moderate size, medium intensity, partially reversible inferior defect consistent with prior inferior MI vs diaphragmatic attenuation and mild inferior ischemia towards the apex; EF 61 with normal wall motion.  2D echo on July 06, 2015: - Left ventricle: The cavity size was normal. Wall thickness was    normal. Systolic function was moderately reduced. The estimated    ejection fraction was in the range of 35% to 40%. Akinesis of the    apicalanteroseptal, inferior, and apical myocardium. Doppler    parameters are consistent with abnormal left  ventricular    relaxation (grade 1  diastolic dysfunction).   Left heart cath on July 05, 2015: Ms Hanssen had three-vessel disease with severe LV dysfunction. Her infarct related artery was occluded PDA although she has disease in the past. Branch, circumflex obtuse marginal branch and LAD and diagonal branches. Her ejection fraction was approximately 20%. The procedure was performed radially. She will received radial cocktail. She received weight based bivalirudin and Brilenta. The PDA was crossed with a prolonged or guidewire, angioplasty with a 2 mm balloon and stented with a 2.25 x 16 mm long synergy drug-eluting stent at 18 atm (2.4 mm) resulting reduction with occlusion to 0% residual TIMI-3 flow. Total contrast used during the case was 135 mL. The ACT was 331. The door to balloon time was 36 minutes.   Has has three-vessel disease with severe LV dysfunction. Her infarct vessel was the PDA which was stented with a drug-eluting stent. She has residual disease in the LAD, diagonal branch and circumflex marginal branch. It appears that her LV dysfunction is out of proportion to the degree of CAD. She'll need to be treated with the routine post-STEMI medications including aspirin, Proventil, beta blocker, ACE inhibitor and high-dose statin drug. Consideration will be given for staged intervention of the circumflex, LAD and diagonal branches. She may require a LIFEVEST  prior to discharge.  Recent Labs: 07/24/2022: ALT 6; BUN 25; Creatinine, Ser 0.96; Hemoglobin 11.2; Platelets 206; Potassium 3.2; Sodium 141  Recent Lipid Panel    Component Value Date/Time   CHOL 322 (H) 05/05/2021 0315   TRIG 851 (H) 05/05/2021 0315   HDL 29 (L) 05/05/2021 0315   CHOLHDL 11.1 05/05/2021 0315   VLDL UNABLE TO CALCULATE IF TRIGLYCERIDE OVER 400 mg/dL 05/05/2021 0315   LDLCALC UNABLE TO CALCULATE IF TRIGLYCERIDE OVER 400 mg/dL 05/05/2021 0315   LDLDIRECT 123.1 (H) 05/05/2021 0315    Physical Exam:    VS:  BP 102/70 (BP Location: Left Arm, Patient  Position: Sitting, Cuff Size: Normal)   Pulse 64   Ht 4' 11.75" (1.518 m)   Wt 166 lb (75.3 kg)   LMP 12/05/2015   SpO2 100%   BMI 32.69 kg/m     Wt Readings from Last 3 Encounters:  10/12/22 166 lb (75.3 kg)  08/04/22 150 lb (68 kg)  07/24/22 150 lb (68 kg)     GEN: Obese, 56 y.o. female in NAD  HEENT: Normal NECK: No JVD; No carotid bruits CARDIAC: S1/S2, RRR, no murmurs, rubs, gallops; 2+ peripheral pulses throughout, strong and equal bilaterally RESPIRATORY:  Clear and diminished to auscultation without rales, wheezing or rhonchi  MUSCULOSKELETAL:  No edema; No deformity  SKIN: Warm and dry NEUROLOGIC:  Alert and oriented x 3, occasional forgetfulness  PSYCHIATRIC:  Normal affect   ASSESSMENT:    1. Coronary artery disease involving native coronary artery of native heart without angina pectoris   2. History of ischemic cardiomyopathy   3. Mixed hyperlipidemia   4. Medication management   5. Hypertension, unspecified type   6. Type 2 diabetes mellitus with complication, with long-term current use of insulin ([place])   7. History of anemia   8. Memory loss   9. Class 1 obesity due to excess calories with body mass index (BMI) of 32.0 to 32.9 in adult, unspecified whether serious comorbidity present    PLAN:    In order of problems listed above:  CAD, status post STEMI, status post DES x 2 to PDA with medical treatment  for residual disease in 2016 Stable with no anginal symptoms.  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  No indication for ischemic evaluation.  Continue aspirin, Lipitor, carvedilol, Plavix, Imdur, lisinopril, nitroglycerin as needed. Heart healthy diet and regular cardiovascular exercise encouraged.  She is due for lab work and we will obtain the following: Fasting lipid panel and CMET.   2. History of ischemic cardiomyopathy EF < 25% in 2016 during the time of her MI.  Most recent echocardiogram  04/2021 revealed EF 60 to 65%.  No RWMA noted along left ventricle with moderate LVH, grade 1 DD. Euvolemic and well compensated on exam. Continue ASA, Lipitor, carvedilol, Plavix, Imdur, lisinopril, and nitroglycerin as needed. Heart healthy, low diet and regular cardiovascular exercise encouraged.   3. Hyperlipidemia Labs from June 2022 revealed total cholesterol 322, HDL 29, with no current LDL on file, and triglycerides 851.  Will draw fasting lipid panel and CMET as mentioned above.  Continue Lipitor. If repeat labs still reveal elevations, may need to consider Zetia or consider referral to lipid clinic. Heart healthy diet and regular cardiovascular exercise encouraged.   4. Hypertension Blood pressure today 102/70.  BP well-controlled at home.  She is asymptomatic with this BP. Discussed to monitor BP at home at least 2 hours after medications and sitting for 5-10 minutes.  Continue carvedilol, hydrochlorothiazide, and lisinopril.  If blood pressure continues to remain low normal and she becomes symptomatic, may need to titrate down lisinopril at future OV. Heart healthy diet and regular cardiovascular exercise encouraged.   5. Type 2 diabetes Last A1c from June 2022 was 9.8%.  She is requesting to have repeat A1c drawn and will order this. Heart healthy, diabetic diet and regular cardiovascular exercise encouraged.  Continue insulin therapy and metformin. Continue to follow with PCP who will manage this.  6. Mild anemia Labs from September 2023 revealed hemoglobin 11.2, hematocrit 35.6.  Will obtain CBC and iron panel. Continue to follow with PCP.  7. Memory loss Has been ongoing for the past 4 to 5 months, etiology unknown, denied any strokelike symptoms.  Will obtain TSH with T3 and T4, as she is requesting this.  Has seen urologist in the past, and recommended she continue to follow-up with neurology.  Recommended brain stimulating activities including crossword searches, [place], and  listening to classical music. Continue to follow with PCP. ED precautions discussed.   8.  Class 1 Obesity  BMI today 32.69. Weight loss via diet and exercise encouraged. Discussed the impact being overweight would have on cardiovascular risk. Heart healthy diet and regular cardiovascular exercise encouraged.     9. Disposition: Follow-up with me or Dr. Carlyle Dolly in 5-6 months or sooner if anything changes.   Medication Adjustments/Labs and Tests Ordered: Current medicines are reviewed at length with the patient today.  Concerns regarding medicines are outlined above.  Orders Placed This Encounter  Procedures   Comprehensive metabolic panel   CBC   Lipid panel   Iron   Hemoglobin A1c   TSH+T4F+T3Free   No orders of the defined types were placed in this encounter.   Patient Instructions  Medication Instructions:  Your physician recommends that you continue on your current medications as directed. Please refer to the Current Medication list given to you today.   Labwork: CMET, CBC, Lipid, Iron, HgbA1C, TSH/T3/T4 (2 weeks @ [facility])  Testing/Procedures: none  Follow-Up:  Your physician recommends that you schedule a follow-up  appointment in: 6 months  Any Other Special Instructions Will Be Listed Below (If Applicable).  Please follow up with your PCP, Neurologist and Orthopedic doctors.  If you need a refill on your cardiac medications before your next appointment, please call your pharmacy.    SignedFinis Bud, NP  10/13/2022 11:01 AM    [place]

## 2022-10-12 ENCOUNTER — Encounter: Payer: Self-pay | Admitting: Nurse Practitioner

## 2022-10-12 ENCOUNTER — Ambulatory Visit: Payer: 59 | Attending: Nurse Practitioner | Admitting: Nurse Practitioner

## 2022-10-12 VITALS — BP 102/70 | HR 64 | Ht 59.75 in | Wt 166.0 lb

## 2022-10-12 DIAGNOSIS — I10 Essential (primary) hypertension: Secondary | ICD-10-CM

## 2022-10-12 DIAGNOSIS — R413 Other amnesia: Secondary | ICD-10-CM

## 2022-10-12 DIAGNOSIS — Z6832 Body mass index (BMI) 32.0-32.9, adult: Secondary | ICD-10-CM

## 2022-10-12 DIAGNOSIS — I2510 Atherosclerotic heart disease of native coronary artery without angina pectoris: Secondary | ICD-10-CM | POA: Diagnosis not present

## 2022-10-12 DIAGNOSIS — E6609 Other obesity due to excess calories: Secondary | ICD-10-CM

## 2022-10-12 DIAGNOSIS — Z79899 Other long term (current) drug therapy: Secondary | ICD-10-CM | POA: Diagnosis not present

## 2022-10-12 DIAGNOSIS — Z8679 Personal history of other diseases of the circulatory system: Secondary | ICD-10-CM | POA: Diagnosis not present

## 2022-10-12 DIAGNOSIS — E118 Type 2 diabetes mellitus with unspecified complications: Secondary | ICD-10-CM

## 2022-10-12 DIAGNOSIS — E782 Mixed hyperlipidemia: Secondary | ICD-10-CM

## 2022-10-12 DIAGNOSIS — Z862 Personal history of diseases of the blood and blood-forming organs and certain disorders involving the immune mechanism: Secondary | ICD-10-CM

## 2022-10-12 DIAGNOSIS — Z794 Long term (current) use of insulin: Secondary | ICD-10-CM

## 2022-10-12 NOTE — Patient Instructions (Addendum)
Medication Instructions:  Your physician recommends that you continue on your current medications as directed. Please refer to the Current Medication list given to you today.   Labwork: CMET, CBC, Lipid, Iron, HgbA1C, TSH/T3/T4 (2 weeks @ [facility])  Testing/Procedures: none  Follow-Up:  Your physician recommends that you schedule a follow-up appointment in: 6 months  Any Other Special Instructions Will Be Listed Below (If Applicable).  Please follow up with your PCP, Neurologist and Orthopedic doctors.  If you need a refill on your cardiac medications before your next appointment, please call your pharmacy.

## 2022-10-13 ENCOUNTER — Telehealth: Payer: Self-pay | Admitting: Orthopedic Surgery

## 2022-10-13 NOTE — Telephone Encounter (Signed)
Pt called and stated that her left shoulder is giving her a fit again, she would like pain medication to take the edge off.  She would like it called to [facility] in [place].  Pt's # is [phone].

## 2022-10-13 NOTE — Telephone Encounter (Signed)
Pt states she has been taking tylenol and ibuprofen but her heart doctor doesn't want her to take much of the ibuprofen. Pt states she's already tried the heating and ice causes more pain.

## 2022-10-19 ENCOUNTER — Ambulatory Visit: Payer: 59 | Admitting: Orthopedic Surgery

## 2022-10-20 ENCOUNTER — Other Ambulatory Visit: Payer: Self-pay | Admitting: Medical

## 2022-10-20 ENCOUNTER — Encounter: Payer: Self-pay | Admitting: Orthopedic Surgery

## 2022-10-20 ENCOUNTER — Ambulatory Visit (INDEPENDENT_AMBULATORY_CARE_PROVIDER_SITE_OTHER): Payer: 59 | Admitting: Orthopedic Surgery

## 2022-10-20 VITALS — Ht 59.75 in | Wt 166.0 lb

## 2022-10-20 DIAGNOSIS — M25512 Pain in left shoulder: Secondary | ICD-10-CM | POA: Diagnosis not present

## 2022-10-20 DIAGNOSIS — G8929 Other chronic pain: Secondary | ICD-10-CM | POA: Diagnosis not present

## 2022-10-20 MED ORDER — CYCLOBENZAPRINE HCL 10 MG PO TABS: 10.0000 mg | ORAL_TABLET | Freq: Two times a day (BID) | ORAL | 0 refills | Status: DC | PRN

## 2022-10-20 MED ORDER — TRAMADOL HCL 50 MG PO TABS: 50.0000 mg | ORAL_TABLET | Freq: Two times a day (BID) | ORAL | 0 refills | Status: DC | PRN

## 2022-10-21 ENCOUNTER — Encounter: Payer: Self-pay | Admitting: Orthopedic Surgery

## 2022-10-21 NOTE — Progress Notes (Signed)
  Return patient Visit  Assessment: Amy Snow is a 56 y.o. female with the following: 1. Chronic pain of left shoulder  Plan: DIAMONE WHISTLER continues to have pain in the left shoulder.  She has had a subacromial injection, as well as an intra-articular injection.  Most recent injection provided sustained relief for about a month.  Since then, she continues to have issues.  I provided her with tramadol, as well as Flexeril.  Will also place a referral for physical therapy.  She states her understanding.  She will follow-up as needed.   Follow-up: Return if symptoms worsen or fail to improve.  Subjective:  Chief Complaint  Patient presents with   Shoulder Pain    Lt shoulder pain going back towards her shoulder blade.     History of Present Illness: Amy Snow is a 56 y.o. female who returns for evaluation of left shoulder pain.  She has had an intra-articular steroid injection of the left shoulder, and this provided sustained relief for about a month.  Then the pain is returned.  She has some pain in the posterior aspect of the shoulder, and more severe pain in the medial aspect of the left scapula.   Review of Systems: No fevers or chills No numbness or tingling No chest pain No shortness of breath No bowel or bladder dysfunction No GI distress No headaches Objective: Ht 4' 11.75" (1.518 m)   Wt 166 lb (75.3 kg)   LMP 12/05/2015   BMI 32.69 kg/m   Physical Exam:  General: Alert and oriented. and No acute distress. Gait: Normal gait.  Left shoulder without deformity.  Forward flexion is limited to 100 degrees.  4+/5 strength.  Positive Jobe's.  Negative belly press.  Fingers are warm and well-perfused.  IMAGING: I personally ordered and reviewed the following images  No new imaging obtained today.   New Medications:  Meds ordered this encounter  Medications   traMADol (ULTRAM) 50 MG tablet    Sig: Take 1 tablet (50 mg total) by mouth every 12  (twelve) hours as needed.    Dispense:  20 tablet    Refill:  0   cyclobenzaprine (FLEXERIL) 10 MG tablet    Sig: Take 1 tablet (10 mg total) by mouth 2 (two) times daily as needed for muscle spasms.    Dispense:  20 tablet    Refill:  0      Mordecai Rasmussen, MD  10/21/2022 9:53 AM

## 2022-10-25 DIAGNOSIS — R69 Illness, unspecified: Secondary | ICD-10-CM | POA: Diagnosis not present

## 2022-10-25 DIAGNOSIS — M25512 Pain in left shoulder: Secondary | ICD-10-CM | POA: Diagnosis not present

## 2022-10-28 DIAGNOSIS — L02429 Furuncle of limb, unspecified: Secondary | ICD-10-CM | POA: Diagnosis not present

## 2022-11-02 DIAGNOSIS — M25512 Pain in left shoulder: Secondary | ICD-10-CM | POA: Diagnosis not present

## 2022-11-03 DIAGNOSIS — E119 Type 2 diabetes mellitus without complications: Secondary | ICD-10-CM | POA: Diagnosis not present

## 2022-11-03 DIAGNOSIS — Z79899 Other long term (current) drug therapy: Secondary | ICD-10-CM | POA: Diagnosis not present

## 2022-11-03 DIAGNOSIS — E559 Vitamin D deficiency, unspecified: Secondary | ICD-10-CM | POA: Diagnosis not present

## 2022-11-03 DIAGNOSIS — E782 Mixed hyperlipidemia: Secondary | ICD-10-CM | POA: Diagnosis not present

## 2022-11-03 DIAGNOSIS — Z862 Personal history of diseases of the blood and blood-forming organs and certain disorders involving the immune mechanism: Secondary | ICD-10-CM | POA: Diagnosis not present

## 2022-11-04 DIAGNOSIS — M25512 Pain in left shoulder: Secondary | ICD-10-CM | POA: Diagnosis not present

## 2022-11-07 ENCOUNTER — Other Ambulatory Visit: Payer: Self-pay | Admitting: Cardiology

## 2022-11-08 DIAGNOSIS — R69 Illness, unspecified: Secondary | ICD-10-CM | POA: Diagnosis not present

## 2022-11-11 DIAGNOSIS — M25512 Pain in left shoulder: Secondary | ICD-10-CM | POA: Diagnosis not present

## 2022-11-13 ENCOUNTER — Other Ambulatory Visit: Payer: Self-pay | Admitting: Nurse Practitioner

## 2022-11-16 ENCOUNTER — Other Ambulatory Visit: Payer: Self-pay

## 2022-11-16 DIAGNOSIS — M25512 Pain in left shoulder: Secondary | ICD-10-CM | POA: Diagnosis not present

## 2022-11-18 DIAGNOSIS — M25512 Pain in left shoulder: Secondary | ICD-10-CM | POA: Diagnosis not present

## 2022-11-19 ENCOUNTER — Other Ambulatory Visit: Payer: Self-pay | Admitting: Medical

## 2022-11-23 ENCOUNTER — Other Ambulatory Visit: Payer: Self-pay

## 2022-11-23 ENCOUNTER — Other Ambulatory Visit: Payer: Self-pay | Admitting: Orthopedic Surgery

## 2022-11-23 MED ORDER — CLOPIDOGREL BISULFATE 75 MG PO TABS: 75.0000 mg | ORAL_TABLET | Freq: Every day | ORAL | 1 refills | Status: DC

## 2022-11-23 NOTE — Telephone Encounter (Signed)
This is Dr. Branch's pt 

## 2022-12-14 ENCOUNTER — Other Ambulatory Visit: Payer: Self-pay | Admitting: Orthopedic Surgery

## 2022-12-15 ENCOUNTER — Ambulatory Visit: Payer: 59 | Admitting: Orthopedic Surgery

## 2023-01-12 ENCOUNTER — Encounter: Payer: Self-pay | Admitting: Orthopedic Surgery

## 2023-01-12 ENCOUNTER — Ambulatory Visit (INDEPENDENT_AMBULATORY_CARE_PROVIDER_SITE_OTHER): Payer: 59 | Admitting: Orthopedic Surgery

## 2023-01-12 ENCOUNTER — Telehealth: Payer: Self-pay

## 2023-01-12 VITALS — Ht 59.75 in | Wt 166.0 lb

## 2023-01-12 DIAGNOSIS — M25512 Pain in left shoulder: Secondary | ICD-10-CM

## 2023-01-12 DIAGNOSIS — M25511 Pain in right shoulder: Secondary | ICD-10-CM | POA: Diagnosis not present

## 2023-01-12 DIAGNOSIS — G8929 Other chronic pain: Secondary | ICD-10-CM

## 2023-01-12 DIAGNOSIS — M75111 Incomplete rotator cuff tear or rupture of right shoulder, not specified as traumatic: Secondary | ICD-10-CM

## 2023-01-12 NOTE — Progress Notes (Signed)
  Return patient Visit  Assessment: Amy Snow is a 57 y.o. female with the following: 1. Chronic pain of left shoulder 2.  Right shoulder rotator cuff tear; minimal symptoms  Plan: Amy Snow continues to have pain in the left shoulder.  She has had a subacromial steroid injection, as well as a glenohumeral joint steroid injection as well as physical therapy.  She notes limited improvement in her symptoms.  At this point, her left is hurting worse than the right.  I think it is reasonable to proceed with an MRI.  This will be ordered, and we will see her back to discuss the findings.  Regarding the right shoulder, she states her pain is starting to get worse.  She would like to proceed with an injection.  This was completed in clinic today.  Procedure note injection - Right shoulder    Verbal consent was obtained to inject the right shoulder, subacromial space Timeout was completed to confirm the site of injection.   The skin was prepped with alcohol and ethyl chloride was sprayed at the injection site.  A 21-gauge needle was used to inject 40 mg of Depo-Medrol and 1% lidocaine (3 cc) into the subacromial space of the right shoulder using a posterolateral approach.  There were no complications.  A sterile bandage was applied.   Follow-up: Return for After MRI.  Subjective:  Chief Complaint  Patient presents with   Shoulder Pain    Lt shoulder pain is still bad. Minor relief from guided injection, and no improvement with therapy.   [id number] Lot: [id number] Exp: 09/22/23    History of Present Illness: Amy Snow is a 57 y.o. female who returns for evaluation of left shoulder pain.  She continues to have pain in the left shoulder.  She has worked with physical therapy, with limited improvement.  She has had a subacromial, as well as a glenohumeral joint injection.  Neither have provided sustained relief.  She is also starting to have pain in the right  shoulder.  Left is worse than right.  She is interested in an injection on the right.  Review of Systems: No fevers or chills No numbness or tingling No chest pain No shortness of breath No bowel or bladder dysfunction No GI distress No headaches  Objective: Ht 4' 11.75" (1.518 m)   Wt 166 lb (75.3 kg)   LMP 12/05/2015   BMI 32.69 kg/m   Physical Exam:  General: Alert and oriented. and No acute distress. Gait: Normal gait.  Left shoulder without deformity.  Forward flexion is limited to 100 degrees.  4+/5 strength.  Positive Jobe's.  Negative belly press.  Fingers are warm and well-perfused.  Positive drop arm test.  Right shoulder without deformity.  Restricted forward flexion and abduction.  Mild strength deficit.  Mild tenderness to palpation over the posterior lateral aspect of her shoulder.  IMAGING: I personally ordered and reviewed the following images  No new imaging obtained today.   New Medications:  No orders of the defined types were placed in this encounter.     Mordecai Rasmussen, MD  01/12/2023 12:10 PM

## 2023-01-12 NOTE — Patient Instructions (Signed)

## 2023-01-12 NOTE — Telephone Encounter (Signed)
Patient has an MRI ordered for her shoulder. After trying to precert imaging online, number for provider services was called.   Per Intake Rep Marshall Cork, Patients coverage terminated on 11/21/22. No new coverage. Attempted to call patient and discuss. No answer. Left generic message asking for call back at the office.

## 2023-01-20 ENCOUNTER — Encounter: Payer: Self-pay | Admitting: Radiology

## 2023-01-26 ENCOUNTER — Telehealth: Payer: Self-pay | Admitting: Orthopedic Surgery

## 2023-01-26 NOTE — Telephone Encounter (Signed)
Patient called, frustrated, stated she hasn't gotten a call about setting up her MRI.  She's not sure where he wanted her to go for this, nothing printed on her AVS for scheduling.  She would like a call back at [phone]

## 2023-01-26 NOTE — Telephone Encounter (Signed)
Patient called and lvm stating she would like Dr. Amedeo Kinsman nurse to call her back again.  Pt's # [phone]

## 2023-01-26 NOTE — Telephone Encounter (Signed)
Spoke with pt and let her know we don't have her recent card in system. Let her know she can take a picture of it in MyChart and send it to [name] and we will get her taken care of as soon as possible.

## 2023-01-27 NOTE — Telephone Encounter (Signed)
Card has been uploaded. Notified pt that I can see it and will start working on her MRI

## 2023-01-31 ENCOUNTER — Other Ambulatory Visit: Payer: Self-pay | Admitting: Orthopedic Surgery

## 2023-02-11 ENCOUNTER — Other Ambulatory Visit: Payer: Self-pay | Admitting: Internal Medicine

## 2023-02-26 ENCOUNTER — Other Ambulatory Visit: Payer: Self-pay

## 2023-02-26 ENCOUNTER — Encounter (HOSPITAL_COMMUNITY): Payer: Self-pay

## 2023-02-26 ENCOUNTER — Emergency Department (HOSPITAL_COMMUNITY): Payer: Self-pay

## 2023-02-26 ENCOUNTER — Emergency Department (HOSPITAL_COMMUNITY)
Admission: EM | Admit: 2023-02-26 | Discharge: 2023-02-26 | Disposition: A | Discharge status: Home / Self Care | Payer: Self-pay | Attending: Student | Admitting: Student

## 2023-02-26 DIAGNOSIS — Z7984 Long term (current) use of oral hypoglycemic drugs: Secondary | ICD-10-CM | POA: Diagnosis not present

## 2023-02-26 DIAGNOSIS — Z79899 Other long term (current) drug therapy: Secondary | ICD-10-CM | POA: Insufficient documentation

## 2023-02-26 DIAGNOSIS — Z794 Long term (current) use of insulin: Secondary | ICD-10-CM | POA: Insufficient documentation

## 2023-02-26 DIAGNOSIS — R079 Chest pain, unspecified: Secondary | ICD-10-CM | POA: Diagnosis present

## 2023-02-26 DIAGNOSIS — I10 Essential (primary) hypertension: Secondary | ICD-10-CM | POA: Diagnosis not present

## 2023-02-26 DIAGNOSIS — E119 Type 2 diabetes mellitus without complications: Secondary | ICD-10-CM | POA: Diagnosis not present

## 2023-02-26 DIAGNOSIS — Z7982 Long term (current) use of aspirin: Secondary | ICD-10-CM | POA: Diagnosis not present

## 2023-02-26 LAB — COMPREHENSIVE METABOLIC PANEL
ALT: 8 U/L (ref 0–44)
AST: 15 U/L (ref 15–41)
Albumin: 3.5 g/dL (ref 3.5–5.0)
Alkaline Phosphatase: 139 U/L — ABNORMAL HIGH (ref 38–126)
Anion gap: 11 (ref 5–15)
BUN: 21 mg/dL — ABNORMAL HIGH (ref 6–20)
CO2: 24 mmol/L (ref 22–32)
Calcium: 8 mg/dL — ABNORMAL LOW (ref 8.9–10.3)
Chloride: 101 mmol/L (ref 98–111)
Creatinine, Ser: 0.91 mg/dL (ref 0.44–1.00)
GFR, Estimated: >60 mL/min (ref >60)
Glucose, Bld: 407 mg/dL — ABNORMAL HIGH (ref 70–99)
Potassium: 4.6 mmol/L (ref 3.5–5.1)
Sodium: 136 mmol/L (ref 135–145)
Total Bilirubin: 0.5 mg/dL (ref 0.3–1.2)
Total Protein: 6.9 g/dL (ref 6.5–8.1)

## 2023-02-26 LAB — CBC WITH DIFFERENTIAL/PLATELET
Abs Immature Granulocytes: 0.06 10*3/uL (ref 0.00–0.07)
Basophils Absolute: 0 10*3/uL (ref 0.0–0.1)
Basophils Relative: 0 %
Eosinophils Absolute: 0.1 10*3/uL (ref 0.0–0.5)
Eosinophils Relative: 2 %
HCT: 32.5 % — ABNORMAL LOW (ref 36.0–46.0)
Hemoglobin: 10.3 g/dL — ABNORMAL LOW (ref 12.0–15.0)
Immature Granulocytes: 1 %
Lymphocytes Relative: 30 %
Lymphs Abs: 1.5 10*3/uL (ref 0.7–4.0)
MCH: 26 pg (ref 26.0–34.0)
MCHC: 31.7 g/dL (ref 30.0–36.0)
MCV: 82.1 fL (ref 80.0–100.0)
Monocytes Absolute: 0.2 10*3/uL (ref 0.1–1.0)
Monocytes Relative: 5 %
Neutro Abs: 3.2 10*3/uL (ref 1.7–7.7)
Neutrophils Relative %: 62 %
Platelets: 199 10*3/uL (ref 150–400)
RBC: 3.96 MIL/uL (ref 3.87–5.11)
RDW: 15.3 % (ref 11.5–15.5)
WBC: 5.1 10*3/uL (ref 4.0–10.5)
nRBC: 0 % (ref 0.0–0.2)

## 2023-02-26 LAB — TROPONIN I (HIGH SENSITIVITY)
Troponin I (High Sensitivity): 4 ng/L (ref <18)
Troponin I (High Sensitivity): 5 ng/L (ref <18)

## 2023-02-26 LAB — PROTIME-INR
INR: 0.9 (ref 0.8–1.2)
Prothrombin Time: 12.5 seconds (ref 11.4–15.2)

## 2023-02-26 LAB — CBG MONITORING, ED: Glucose-Capillary: 387 mg/dL — ABNORMAL HIGH (ref 70–99)

## 2023-02-26 MED ORDER — NITROGLYCERIN 0.4 MG SL SUBL
0.4000 mg | SUBLINGUAL_TABLET | SUBLINGUAL | Status: DC | PRN
  Administered 2023-02-26 (×3): 0.4 mg via SUBLINGUAL
  Filled 2023-02-26: qty 1

## 2023-02-26 MED ORDER — ACETAMINOPHEN 325 MG PO TABS
650.0000 mg | ORAL_TABLET | Freq: Once | ORAL | Status: AC
  Administered 2023-02-26: 650 mg via ORAL
  Filled 2023-02-26: qty 2

## 2023-02-26 NOTE — Discharge Instructions (Signed)
Someone from the cardiology office should be contacting you on Monday to arrange follow-up appointment in their office.  Please return to the emergency department for any new or worsening symptoms.

## 2023-02-26 NOTE — ED Provider Notes (Signed)
[place] EMERGENCY DEPARTMENT AT [facility], [facility] Provider Note   CSN: [id number] Arrival date & time: 02/26/23  1246     History {Add pertinent medical, surgical, social history, OB history to HPI:1} Chief Complaint  Patient presents with   Chest Pain    Amy Snow Regner is a 57 y.o. female.   Chest Pain Associated symptoms: no abdominal pain, no back pain, no cough, no diaphoresis, no dizziness, no fatigue, no fever, no headache, no nausea, no shortness of breath, no vomiting and no weakness        Amy Snow Snader is a 57 y.o. female past medical history includes STEMI in 2016 with 3 cardiac stents, type 2 diabetes hypertension, fibromyalgia, who presents to the Emergency Department complaining of radiating chest pain that began last evening around 830.  She describes a stabbing type pain that radiates from lateral chest to lateral chest.  She states pain feels similar to her prior MI in 2016.  She took 1 nitroglycerin and 2 ASA at onset with some relief of pain.  Went to bed and woke around 1130 pain had returned, she took another nitroglycerin and 2 more ASA pain again subsided.  This morning after waking states pain returned again but somewhat more subdued than last evening.  Pain has not been associated with neck or jaw pain, diaphoresis, nausea or vomiting.  No pain radiating in her upper extremities.  Has seen cardiology in [place], no recent visits. Endorses cardiac cath in 2020  Home Medications Prior to Admission medications   Medication Sig Start Date End Date Taking? Authorizing Provider  acetaminophen (TYLENOL) 650 MG CR tablet Take 650-1,300 mg by mouth every 8 (eight) hours as needed for pain.     [provider]  albuterol (VENTOLIN HFA) 108 (90 Base) MCG/ACT inhaler Inhale 2 puffs into the lungs every 4 (four) hours as needed for wheezing or shortness of breath (cough). 07/24/22   Shon HaleEmokpae, Courage, MD  amitriptyline (ELAVIL) 50 MG tablet Take 50 mg by  mouth at bedtime. 06/15/21   [provider]  aspirin 81 MG chewable tablet Chew 1 tablet (81 mg total) by mouth daily. 05/15/19   Barrett, Joline Salthonda G, PA-C  atorvastatin (LIPITOR) 80 MG tablet Take 1 tablet by mouth once daily 11/16/22   Sharlene DoryPeck, Elizabeth, NP  carvedilol (COREG) 25 MG tablet Take 1 tablet (25 mg total) by mouth 2 (two) times daily. 05/06/21   Kroeger, Ovidio KinKrista M., PA-C  clopidogrel (PLAVIX) 75 MG tablet Take 1 tablet (75 mg total) by mouth daily. Please schedule appointment for further refills 11/23/22   Sharlene DoryPeck, Elizabeth, NP  cyclobenzaprine (FLEXERIL) 10 MG tablet TAKE 1 TABLET BY MOUTH TWICE DAILY AS NEEDED FOR MUSCLE SPASM 01/31/23   Oliver Barreairns, Mark A, MD  dextromethorphan-guaiFENesin [facility](MUCINEX DM) 30-600 MG 12hr tablet Take 1 tablet by mouth 2 (two) times daily. 07/24/22   Shon HaleEmokpae, Courage, MD  hydrochlorothiazide (MICROZIDE) 12.5 MG capsule TAKE 1 CAPSULE BY MOUTH ONCE DAILY-TAKE AN EXTRA CAPSULE DAILY AS NEEDED FOR WEIGHT GAIN OF 3 LBS IN ONE DAY OR 5 LBS IN ONE WEEK 07/26/22   Emokpae, Courage, MD  insulin NPH-regular Human (70-30) 100 UNIT/ML injection Inject 80 Units into the skin 2 (two) times a day.     [provider]  isosorbide mononitrate (IMDUR) 60 MG 24 hr tablet Take 1 tablet (60 mg total) by mouth daily. 07/26/22   Shon HaleEmokpae, Courage, MD  lisinopril (ZESTRIL) 20 MG tablet Take 1 tablet by mouth once daily  11/08/22   Sharlene DoryPeck, Elizabeth, NP  meclizine (ANTIVERT) 12.5 MG tablet Take 12.5 mg by mouth every 6 (six) hours as needed. 05/26/21   [provider]  metFORMIN (GLUCOPHAGE) 500 MG tablet Take 500 mg by mouth 2 (two) times daily. 06/15/21   [provider]  nitroGLYCERIN (NITROSTAT) 0.4 MG SL tablet Place 1 tablet (0.4 mg total) under the tongue every 5 (five) minutes x 3 doses as needed for chest pain. 05/06/21   Kroeger, Ovidio KinKrista M., PA-C  ondansetron (ZOFRAN) 4 MG tablet Take 4 mg by mouth every 8 (eight) hours as needed. 05/26/21   [provider]   pantoprazole (PROTONIX) 40 MG tablet Take 1 tablet by mouth once daily 02/11/23   Sharlene DoryPeck, Elizabeth, NP  potassium chloride (KLOR-CON) 10 MEQ tablet Take 1 tablet (10 mEq total) by mouth daily. Take While taking hydrochlorothiazide/HCTZ 07/24/22   Shon HaleEmokpae, Courage, MD  sertraline (ZOLOFT) 100 MG tablet Take 150 mg by mouth at bedtime.  01/09/20   [provider]  topiramate (TOPAMAX) 50 MG tablet Take 50 mg by mouth 2 (two) times daily. 06/15/21   [provider]  traMADol (ULTRAM) 50 MG tablet TAKE 1 TABLET BY MOUTH EVERY 12 HOURS AS NEEDED 01/31/23   Oliver Barreairns, Mark A, MD  traZODone (DESYREL) 50 MG tablet Take 50 mg by mouth at bedtime.    [provider]      Allergies    Patient has no known allergies.    Review of Systems   Review of Systems  Constitutional:  Negative for chills, diaphoresis, fatigue and fever.  Respiratory:  Negative for cough and shortness of breath.   Cardiovascular:  Positive for chest pain. Negative for leg swelling.  Gastrointestinal:  Negative for abdominal pain, nausea and vomiting.  Genitourinary:  Negative for dysuria.  Musculoskeletal:  Negative for back pain.  Skin:  Negative for wound.  Neurological:  Negative for dizziness, facial asymmetry, weakness and headaches.    Physical Exam Updated Vital Signs Temp 98.5 F (36.9 C) (Oral)   Ht 4\' 11"  (1.499 m)   LMP 12/05/2015   BMI 33.53 kg/m  Physical Exam Vitals and nursing note reviewed.  Constitutional:      Appearance: Normal appearance.  Cardiovascular:     Rate and Rhythm: Normal rate and regular rhythm.     Pulses: Normal pulses.  Pulmonary:     Effort: Pulmonary effort is normal.     Breath sounds: Normal breath sounds.  Chest:     Chest wall: No tenderness.  Abdominal:     Palpations: Abdomen is soft.     Tenderness: There is no abdominal tenderness.  Musculoskeletal:        General: Normal range of motion.     Right lower leg: No edema.     Left lower leg: No  edema.  Skin:    General: Skin is warm.     Capillary Refill: Capillary refill takes less than 2 seconds.  Neurological:     General: No focal deficit present.     Mental Status: She is alert.     Sensory: No sensory deficit.     Motor: No weakness.     ED Results / Procedures / Treatments   Labs (all labs ordered are listed, but only abnormal results are displayed) Labs Reviewed  CBC WITH DIFFERENTIAL/PLATELET - Abnormal; Notable for the following components:      Result Value   Hemoglobin 10.3 (*)    HCT 32.5 (*)  All other components within normal limits  COMPREHENSIVE METABOLIC PANEL - Abnormal; Notable for the following components:   Glucose, Bld 407 (*)    BUN 21 (*)    Calcium 8.0 (*)    Alkaline Phosphatase 139 (*)    All other components within normal limits  CBG MONITORING, ED - Abnormal; Notable for the following components:   Glucose-Capillary 387 (*)    All other components within normal limits  PROTIME-INR  TROPONIN I (HIGH SENSITIVITY)  TROPONIN I (HIGH SENSITIVITY)    EKG EKG Interpretation  Date/Time:  Saturday February 26 2023 13:00:00 EDT Ventricular Rate:  88 PR Interval:  137 QRS Duration: 94 QT Interval:  379 QTC Calculation: 459 R Axis:   16 Text Interpretation: Sinus rhythm Low voltage, precordial leads Confirmed by Kommor, Madison ([id number]) on 02/26/2023 2:05:02 PM  Radiology DG Chest Portable 1 View  Result Date: 02/26/2023 CLINICAL DATA:  Chest pain since last night, radiating through the back and into both shoulders. EXAM: PORTABLE CHEST 1 VIEW COMPARISON:  07/24/2022 FINDINGS: The heart size and mediastinal contours are within normal limits. Both lungs are clear. The visualized skeletal structures are unremarkable. IMPRESSION: No active disease. Electronically Signed   By: Beckie Salts M.D.   On: 02/26/2023 13:50    Procedures Procedures  {Document cardiac monitor, telemetry assessment procedure when appropriate:1}  Medications Ordered in  ED Medications - No data to display  ED Course/ Medical Decision Making/ A&P   {   Click here for ABCD2, HEART and other calculatorsREFRESH Note before signing :1}                          Medical Decision Making Amount and/or Complexity of Data Reviewed Labs: ordered. Radiology: ordered.  Risk OTC drugs. Prescription drug management. Risk Details: Chest pain improved here after 3 SL NTG.       {Document critical care time when appropriate:1} {Document review of labs and clinical decision tools ie heart score, Chads2Vasc2 etc:1}  {Document your independent review of radiology images, and any outside records:1} {Document your discussion with family members, caretakers, and with consultants:1} {Document social determinants of health affecting pt's care:1} {Document your decision making why or why not admission, treatments were needed:1} Final Clinical Impression(s) / ED Diagnoses Final diagnoses:  None    Rx / DC Orders ED Discharge Orders     None

## 2023-02-26 NOTE — ED Triage Notes (Signed)
Chest pain that started last night. Pain is on both sides of chest, straight through to pt's back and both shoulders. Denies pain in arms.

## 2023-03-10 ENCOUNTER — Other Ambulatory Visit: Payer: Self-pay | Admitting: Orthopedic Surgery

## 2023-03-23 ENCOUNTER — Other Ambulatory Visit: Payer: Self-pay | Admitting: Nurse Practitioner

## 2023-03-29 ENCOUNTER — Telehealth: Payer: Self-pay | Admitting: Orthopedic Surgery

## 2023-03-29 NOTE — Telephone Encounter (Signed)
The patient emailed me a copy of her [employer] insurance card to run eligibility.  I tried a few times and it kept coming back as inactive.  I had Milana Na review and she advised to call [employer].  I called [employer], the patient's policy is inactive.  The representative stated that her plan was also OON with [name].  He did show we are in network with [employer], but not with her plan.  Either way she is inactive.  I did call the patient and advise, she verbally acknowledge understanding.

## 2023-04-05 ENCOUNTER — Ambulatory Visit: Payer: 59 | Admitting: Nurse Practitioner

## 2023-04-29 ENCOUNTER — Other Ambulatory Visit: Payer: Self-pay | Admitting: Orthopedic Surgery

## 2023-05-12 ENCOUNTER — Ambulatory Visit: Payer: 59 | Attending: Nurse Practitioner | Admitting: Nurse Practitioner

## 2023-05-12 ENCOUNTER — Encounter: Payer: Self-pay | Admitting: Nurse Practitioner

## 2023-05-12 VITALS — BP 152/84 | HR 83 | Ht 59.75 in | Wt 169.2 lb

## 2023-05-12 DIAGNOSIS — I2510 Atherosclerotic heart disease of native coronary artery without angina pectoris: Secondary | ICD-10-CM

## 2023-05-12 DIAGNOSIS — E785 Hyperlipidemia, unspecified: Secondary | ICD-10-CM | POA: Diagnosis not present

## 2023-05-12 DIAGNOSIS — R600 Localized edema: Secondary | ICD-10-CM

## 2023-05-12 DIAGNOSIS — I10 Essential (primary) hypertension: Secondary | ICD-10-CM | POA: Diagnosis not present

## 2023-05-12 DIAGNOSIS — E669 Obesity, unspecified: Secondary | ICD-10-CM

## 2023-05-12 DIAGNOSIS — Z794 Long term (current) use of insulin: Secondary | ICD-10-CM

## 2023-05-12 DIAGNOSIS — Z8679 Personal history of other diseases of the circulatory system: Secondary | ICD-10-CM

## 2023-05-12 DIAGNOSIS — E118 Type 2 diabetes mellitus with unspecified complications: Secondary | ICD-10-CM

## 2023-05-12 MED ORDER — HYDROCHLOROTHIAZIDE 12.5 MG PO CAPS: ORAL_CAPSULE | ORAL | 2 refills | Status: DC

## 2023-05-12 MED ORDER — BLOOD PRESSURE MONITOR DEVI: 1.0000 | Freq: Every day | 0 refills | Status: DC

## 2023-05-12 MED ORDER — POTASSIUM CHLORIDE ER 10 MEQ PO TBCR: 10.0000 meq | EXTENDED_RELEASE_TABLET | Freq: Every day | ORAL | 2 refills | Status: DC

## 2023-05-12 MED ORDER — LISINOPRIL 20 MG PO TABS: 20.0000 mg | ORAL_TABLET | Freq: Every day | ORAL | 2 refills | Status: DC

## 2023-05-12 MED ORDER — NITROGLYCERIN 0.4 MG SL SUBL: 0.4000 mg | SUBLINGUAL_TABLET | SUBLINGUAL | 3 refills | Status: DC | PRN

## 2023-05-12 MED ORDER — ATORVASTATIN CALCIUM 80 MG PO TABS: 80.0000 mg | ORAL_TABLET | Freq: Every day | ORAL | 2 refills | Status: DC

## 2023-05-12 MED ORDER — PANTOPRAZOLE SODIUM 40 MG PO TBEC: 40.0000 mg | DELAYED_RELEASE_TABLET | Freq: Every day | ORAL | 6 refills | Status: DC

## 2023-05-12 MED ORDER — CARVEDILOL 25 MG PO TABS: 25.0000 mg | ORAL_TABLET | Freq: Two times a day (BID) | ORAL | 3 refills | Status: DC

## 2023-05-12 MED ORDER — CLOPIDOGREL BISULFATE 75 MG PO TABS: 75.0000 mg | ORAL_TABLET | Freq: Every day | ORAL | 2 refills | Status: DC

## 2023-05-12 NOTE — Progress Notes (Addendum)
Cardiology Office Note:    Date:  05/12/2023   ID:  Amy Snow, DOB [birth year], MRN [id number]  PCP:  Shelby Dubin, FNP   [facility] Providers Cardiologist:  Nanetta Batty, MD     Referring MD: Shelby Dubin, FNP   CC: Here for follow-up  History of Present Illness:    Amy Snow is a 57 y.o. female with a hx of the following  CAD, s/p STEMI and s/p drug-eluting stent x 2 to PDA with medical treatment for residual disease in 2016 Hyperlipidemia Hypertension Type 2 diabetes Mild anemia Fibromyalgia Anxiety   Previous CV history includes STEMI in 2016 with drug-eluting stent x 2 to PDA.  In 2020 she had an office visit that was concerning for progressive angina, underwent heart catheterization and received PCI/DES to LAD with medical treatment for PDA/PL disease.  Echo revealed 55 to 60% EF.  In 2021 was ruled out for ACS/MI and was discharged home due to no EKG changes indicating ischemia, negative troponins.  Chest x-ray revealed low lung volumes, negative for anything acute.  Was discharged home.  I last saw patient on October 12, 2022 for overdue follow-up appointment.  Was overall doing well from a cardiac perspective.  Did note some musculoskeletal/fibromyalgia and chronic pain.  Denied any anginal-like chest pain.  ED visit on February 26, 2023 for nonspecific chest pain.  Workup in ED was reassuring with symptoms improved with nitroglycerin.  Was recommended to closely follow-up with cardiology.  Today she presents for scheduled follow-up. Doing well. Denies any chest pain, shortness of breath, palpitations, syncope, presyncope, dizziness, orthopnea, PND,  significant weight changes, acute bleeding, or claudication.  Does notice some occasional leg edema, owns compression socks.   SH: Administrator, sports, works and cares for babies  Past Medical History:  Diagnosis Date   Anxiety and depression    CAD (coronary artery disease)    a. s/p STEMI 2016 s/p  DES x 2 PDA with residual dz treated medically. b. [place] in 2020 with cath s/p DES to long high grade lesion in LAD, residual disease treated medically, EF 50%.   Diabetes mellitus, type II (HCC)    Family history of adverse reaction to anesthesia    PONV   Fibromyalgia    GERD (gastroesophageal reflux disease)    Hyperlipidemia    Hypertension    Ischemic cardiomyopathy    a. EF <25% in 2016, improved to normal thereafter. b. EF 50% in 05/2019.   Myocardial infarct [facility])    august 2016    Past Surgical History:  Procedure Laterality Date   BIOPSY  08/15/2020   Procedure: BIOPSY;  Surgeon: Dolores Frame, MD;  Location: [facility];  Service: Gastroenterology;;   CARDIAC CATHETERIZATION N/A 07/05/2015   Procedure: Left Heart Cath and Coronary Angiography;  Surgeon: Runell Gess, MD;  Location: [facility] INVASIVE CV LAB;  Service: Cardiovascular;  Laterality: N/A;   CESAREAN SECTION     COLONOSCOPY WITH PROPOFOL N/A 08/15/2020   Procedure: COLONOSCOPY WITH PROPOFOL;  Surgeon: Dolores Frame, MD;  Location: [facility];  Service: Gastroenterology;  Laterality: N/A;  1230   CORONARY STENT INTERVENTION N/A 05/24/2019   Procedure: CORONARY STENT INTERVENTION;  Surgeon: Runell Gess, MD;  Location: [facility] INVASIVE CV LAB;  Service: Cardiovascular;  Laterality: N/A;   ESOPHAGEAL DILATION N/A 08/15/2020   Procedure: ESOPHAGEAL DILATION;  Surgeon: Dolores Frame, MD;  Location: [facility];  Service: Gastroenterology;  Laterality: N/A;  ESOPHAGOGASTRODUODENOSCOPY (EGD) WITH PROPOFOL N/A 08/15/2020   Procedure: ESOPHAGOGASTRODUODENOSCOPY (EGD) WITH PROPOFOL;  Surgeon: Dolores Frame, MD;  [facility]: [facility];  Service: Gastroenterology;  Laterality: N/A;   HERNIA REPAIR Left    x2 ventral and inguinal   LEFT HEART CATH AND CORONARY ANGIOGRAPHY N/A 05/24/2019   Procedure: LEFT HEART CATH AND CORONARY ANGIOGRAPHY;  Surgeon: Runell Gess, MD;   Location: [facility] INVASIVE CV LAB;  Service: Cardiovascular;  Laterality: N/A;   LEFT HEART CATH AND CORONARY ANGIOGRAPHY N/A 05/05/2021   Procedure: LEFT HEART CATH AND CORONARY ANGIOGRAPHY;  Surgeon: Lennette Bihari, MD;  Location: [facility] INVASIVE CV LAB;  Service: Cardiovascular;  Laterality: N/A;   POLYPECTOMY  08/15/2020   Procedure: POLYPECTOMY;  Surgeon: Marguerita Merles, Reuel Boom, MD;  Location: [facility];  Service: Gastroenterology;;   TONSILLECTOMY     TUBAL LIGATION      Current Medications: Current Meds  Medication Sig   acetaminophen (TYLENOL) 650 MG CR tablet Take 650-1,300 mg by mouth every 8 (eight) hours as needed for pain.    albuterol (VENTOLIN HFA) 108 (90 Base) MCG/ACT inhaler Inhale 2 puffs into the lungs every 4 (four) hours as needed for wheezing or shortness of breath (cough).   amitriptyline (ELAVIL) 50 MG tablet Take 50 mg by mouth at bedtime.   aspirin 81 MG chewable tablet Chew 1 tablet (81 mg total) by mouth daily.   Blood Pressure Monitor DEVI 1 each by Does not apply route daily.   cyclobenzaprine (FLEXERIL) 10 MG tablet TAKE 1 TABLET BY MOUTH TWICE DAILY AS NEEDED FOR MUSCLE SPASM   insulin NPH-regular Human (70-30) 100 UNIT/ML injection Inject 80 Units into the skin 2 (two) times a day.    isosorbide mononitrate (IMDUR) 60 MG 24 hr tablet Take 1 tablet (60 mg total) by mouth daily.   meclizine (ANTIVERT) 12.5 MG tablet Take 12.5 mg by mouth every 6 (six) hours as needed.   metFORMIN (GLUCOPHAGE) 500 MG tablet Take 500 mg by mouth 2 (two) times daily.   ondansetron (ZOFRAN) 4 MG tablet Take 4 mg by mouth every 8 (eight) hours as needed.   sertraline (ZOLOFT) 100 MG tablet Take 150 mg by mouth at bedtime.    topiramate (TOPAMAX) 50 MG tablet Take 50 mg by mouth 2 (two) times daily.   traMADol (ULTRAM) 50 MG tablet TAKE 1 TABLET BY MOUTH EVERY 12 HOURS AS NEEDED   traZODone (DESYREL) 50 MG tablet Take 50 mg by mouth at bedtime.   atorvastatin (LIPITOR) 80 MG tablet  Take 1 tablet by mouth once daily    carvedilol (COREG) 25 MG tablet Take 1 tablet (25 mg total) by mouth 2 (two) times daily.   clopidogrel (PLAVIX) 75 MG tablet Take 1 tablet (75 mg total) by mouth daily. Please schedule appointment for further refills   [DISCONTINUED] hydrochlorothiazide (MICROZIDE) 12.5 MG capsule TAKE 1 CAPSULE BY MOUTH ONCE DAILY-TAKE AN EXTRA CAPSULE DAILY AS NEEDED FOR WEIGHT GAIN OF 3 LBS IN ONE DAY OR 5 LBS IN ONE WEEK   lisinopril (ZESTRIL) 20 MG tablet Take 1 tablet by mouth once daily   nitroGLYCERIN (NITROSTAT) 0.4 MG SL tablet Place 1 tablet (0.4 mg total) under the tongue every 5 (five) minutes x 3 doses as needed for chest pain.   pantoprazole (PROTONIX) 40 MG tablet Take 1 tablet by mouth once daily    potassium chloride (KLOR-CON) 10 MEQ tablet Take 1 tablet (10 mEq total) by mouth daily. Take While taking  hydrochlorothiazide/HCTZ     Allergies:   Patient has no known allergies.   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  She has two children 19 & 26 as well as three grandchildren.  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     Social Determinants of Health   Financial Resource Strain: Not on file  Food Insecurity: Not on file  Transportation Needs: Not on file  Physical Activity: Not on file  Stress: Not on file  Social Connections: Not on file     Family  History: The patient's family history includes Alcohol abuse in her father and mother; Anxiety disorder in her father and sister; Depression in her cousin, father, maternal aunt, and sister.  ROS:     Please see the history of present illness.    All other systems reviewed and are negative.  EKGs/Labs/Other Studies Reviewed:    The following studies were reviewed today:   EKG:  EKG is not ordered today.   2D complete echo on May 06, 2021:  1. Left ventricular ejection fraction, by estimation, is 60 to 65%. The  left ventricle has normal function. The left ventricle has no regional  wall motion abnormalities. There is moderate concentric left ventricular  hypertrophy. Left ventricular  diastolic parameters are consistent with Grade I diastolic dysfunction  (impaired relaxation).   2. Right ventricular systolic function is normal. The right ventricular  size is normal. Tricuspid regurgitation signal is inadequate for assessing  PA pressure.   3. The mitral valve is grossly normal. No evidence of mitral valve  regurgitation. No evidence of mitral stenosis.   4. The aortic valve is tricuspid. There is mild calcification of the  aortic valve. There is mild thickening of the aortic valve. Aortic valve  regurgitation is not visualized. Mild aortic valve sclerosis is present,  with no evidence of aortic valve  stenosis.   5. The inferior vena cava is dilated in size with >50% respiratory  variability, suggesting right atrial pressure of 8 mmHg.   Comparison(s): A prior study was performed on 06/14/2019. No significant  change from prior study. Prior images reviewed side by side.  Left heart cath on May 05, 2021: Previously placed RPDA stent (unknown type) is widely patent. Prox RCA lesion is 30% stenosed. Mid RCA lesion is 30% stenosed. Previously placed Prox LAD to Mid LAD stent (unknown type) is widely patent. 1st Mrg lesion is 80% stenosed. Prox Cx to Mid Cx lesion is 70%  stenosed. Mid Cx to Dist Cx lesion is 80% stenosed. 2nd RPL lesion is 30% stenosed.   Widely patent previously placed stents in the proximal to mid LAD and PDA of the right coronary artery.  The LAD is otherwise normal and the RCA has mild nonobstructive 30% proximal  and mid smooth stenosis.   The left circumflex vessel is very  small caliber with improvement in prior stenoses from 95 and 90% to 70% and 80%.   Normal LV function with EF estimated 55 to 60% without focal segmental wall motion abnormality.  LVEDP 12 mmHg.   RECOMMENDATION: Continue DAPT with aspirin/Plavix.   Resumption of high potency statin therapy with atorvastatin 80 mg and  add Vascepa 2 capsules twice a day and fenofibrate with marked triglyceride elevation of 851.  We will increase medical therapy with isosorbide mononitrate to 60 mg and carvedilol recently increased to 25 mg twice a day.  Plan initial medical management unless recurrent symptomatology.  Left heart cath on May 24, 2019: Successful approximately PCI and drug-eluting stenting of a long high-grade lesion probably responsible for her accelerated symptoms over the last several months.  The circumflex was a relatively small vessel with long segmental disease.  The right was a large vessel with a patent PDA stent and some moderate disease in the mid PDA and PLA.  LV function was preserved although there was mild inferoapical hypokinesia with a visual EF estimated at approximate 50%.  Patient will need uninterrupted dual antiplatelet platelet therapy for 1 year along with risk factor modification.  She will be gently hydrated overnight and discharged home in the morning.   Myoview on August 20, 2015: The left ventricular ejection fraction is normal (55-65%). Nuclear stress EF: 61%. There was no ST segment deviation noted during stress. This is a low risk study. Findings consistent with ischemia.   Low risk stress nuclear study with a moderate size, medium  intensity, partially reversible inferior defect consistent with prior inferior MI vs diaphragmatic attenuation and mild inferior ischemia towards the apex; EF 61 with normal wall motion.  2D echo on July 06, 2015: - Left ventricle: The cavity size was normal. Wall thickness was    normal. Systolic function was moderately reduced. The estimated    ejection fraction was in the range of 35% to 40%. Akinesis of the    apicalanteroseptal, inferior, and apical myocardium. Doppler    parameters are consistent with abnormal left ventricular    relaxation (grade 1 diastolic dysfunction).   Left heart cath on July 05, 2015: Ms Arend had three-vessel disease with severe LV dysfunction. Her infarct related artery was occluded PDA although she has disease in the past. Branch, circumflex obtuse marginal branch and LAD and diagonal branches. Her ejection fraction was approximately 20%. The procedure was performed radially. She will received radial cocktail. She received weight based bivalirudin and Brilenta. The PDA was crossed with a prolonged or guidewire, angioplasty with a 2 mm balloon and stented with a 2.25 x 16 mm long synergy drug-eluting stent at 18 atm (2.4 mm) resulting reduction with occlusion to 0% residual TIMI-3 flow. Total contrast used during the case was 135 mL. The ACT was 331. The door to balloon time was 36 minutes.   Has has three-vessel disease with severe LV dysfunction. Her infarct vessel was the PDA which was stented with a drug-eluting stent. She has residual disease in the LAD, diagonal branch and circumflex marginal branch. It appears that her LV dysfunction is out of proportion to the degree of CAD. She'll need to be treated with the routine post-STEMI medications including aspirin, Proventil, beta blocker, ACE inhibitor and high-dose statin drug. Consideration will be given for staged intervention of the circumflex, LAD and diagonal branches. She may require a LIFEVEST  prior to  discharge.  Recent Labs: 02/26/2023: ALT 8; BUN 21; Creatinine, Ser 0.91; Hemoglobin 10.3; Platelets 199; Potassium 4.6; Sodium 136  Recent Lipid Panel    Component Value Date/Time   CHOL 322 (H) 05/05/2021 0315   TRIG 851 (H) 05/05/2021 0315   HDL 29 (L) 05/05/2021 0315   CHOLHDL 11.1 05/05/2021 0315   VLDL UNABLE TO CALCULATE IF TRIGLYCERIDE OVER 400 mg/dL 40/98/1191 4782   LDLCALC UNABLE TO CALCULATE IF TRIGLYCERIDE OVER 400 mg/dL 95/62/1308 6578   LDLDIRECT 123.1 (H) 05/05/2021 0315    Physical Exam:    VS:  BP (!) 152/84   Pulse 83   Ht 4' 11.75" (1.518 m)   Wt 169 lb 3.2 oz (76.7 kg)   LMP 12/05/2015   SpO2 98%   BMI 33.32 kg/m     Wt Readings from Last 3 Encounters:  05/12/23 169 lb 3.2 oz (76.7 kg)  02/26/23 165 lb 5.5 oz (75 kg)  01/12/23 166 lb (75.3 kg)    Repeat BP: 136/92  GEN: Obese, 57 y.o. female in NAD  HEENT: Normal NECK: No JVD; No carotid bruits CARDIAC: S1/S2, RRR, no murmurs, rubs, gallops; 2+ peripheral pulses throughout, strong and equal bilaterally RESPIRATORY:  Clear and diminished to auscultation without rales, wheezing or rhonchi  MUSCULOSKELETAL:  No edema; No deformity  SKIN: Warm and dry NEUROLOGIC:  Alert and oriented x 3, occasional forgetfulness  PSYCHIATRIC:  Normal affect   ASSESSMENT:    1. Coronary artery disease involving native heart without angina pectoris, unspecified vessel or lesion type   2. History of ischemic cardiomyopathy   3. Hyperlipidemia, unspecified hyperlipidemia type   4. Essential hypertension, benign   5. Type 2 diabetes mellitus with complication, with long-term current use of insulin (HCC)   6. Obesity (BMI 30-39.9)   7. Leg edema     PLAN:    In order of problems listed above:  CAD, status post STEMI, status post DES x 2 to PDA with medical treatment for residual disease in 2016 Stable with no anginal symptoms. No indication for ischemic evaluation.  Continue aspirin, Lipitor, carvedilol, Imdur,  lisinopril, nitroglycerin as needed. Heart healthy diet and regular cardiovascular exercise encouraged.  Will provide refills and will consult Dr. Allyson Sabal about stopping Plavix.   Addendum: Last cath report did not state how long to continue DAPT so Dr. Allyson Sabal was consulted and stated to continue this.   2. History of ischemic cardiomyopathy Stage C, NYHA class I symptoms. EF < 25% in 2016 during the time of her MI.  Most recent echocardiogram 04/2021 revealed EF 60 to 65%.  No RWMA noted along left ventricle with moderate LVH, grade 1 DD. Euvolemic and well compensated on exam. Continue ASA, Lipitor, carvedilol, Imdur, lisinopril, and nitroglycerin as needed. Heart healthy, low diet and regular cardiovascular exercise encouraged.   3. Hyperlipidemia Labs from June 2022 revealed total cholesterol 322, HDL 29, with no current LDL on file, and triglycerides 851.  Continue Lipitor. Has upcoming labs with PCP, she defers repeat blood work at this time to her PCP. Heart healthy diet and regular cardiovascular exercise encouraged.   4. Hypertension Blood pressure on arrival 152/84, repeat BP 136/92. SBP goal <130.  BP well-controlled at home.  Discussed to monitor BP at home at least 2 hours after medications and sitting for 5-10 minutes and to notify office if SBP is consistently greater than 130.  Continue carvedilol, hydrochlorothiazide, and lisinopril. Heart healthy diet and regular cardiovascular exercise encouraged.   5. Type 2 diabetes  Last A1c from June 2022 was 9.8%. Heart healthy, diabetic diet and regular cardiovascular exercise encouraged.  Continue insulin therapy and metformin. Continue to follow with PCP who will manage this.  Has upcoming labs with PCP.  6.  Obesity  Weight loss via diet and exercise encouraged. Discussed the impact being overweight would have on cardiovascular risk. Heart healthy diet and regular cardiovascular exercise encouraged.     7. Leg edema Intermittent leg edema,  recommended to wear her compression stockings.  If no improvement by next OV, discontinue hydrochlorothiazide and start Lasix as needed and make further adjustments if needed to BP medication regimen. Heart healthy diet and regular cardiovascular exercise encouraged.   8. Disposition: Will provide refills per her request.  Follow-up with me or Dr. Allyson Sabal in 6 months or sooner if anything changes.   Medication Adjustments/Labs and Tests Ordered: Current medicines are reviewed at length with the patient today.  Concerns regarding medicines are outlined above.  No orders of the defined types were placed in this encounter.  Meds ordered this encounter  Medications   atorvastatin (LIPITOR) 80 MG tablet    Sig: Take 1 tablet (80 mg total) by mouth daily.    Dispense:  90 tablet    Refill:  2   carvedilol (COREG) 25 MG tablet    Sig: Take 1 tablet (25 mg total) by mouth 2 (two) times daily.    Dispense:  180 tablet    Refill:  3   clopidogrel (PLAVIX) 75 MG tablet    Sig: Take 1 tablet (75 mg total) by mouth daily. Please schedule appointment for further refills    Dispense:  60 tablet    Refill:  2   hydrochlorothiazide (MICROZIDE) 12.5 MG capsule    Sig: TAKE 1 CAPSULE BY MOUTH ONCE DAILY-TAKE AN EXTRA CAPSULE DAILY AS NEEDED FOR WEIGHT GAIN OF 3 LBS IN ONE DAY OR 5 LBS IN ONE WEEK    Dispense:  90 capsule    Refill:  2   potassium chloride (KLOR-CON) 10 MEQ tablet    Sig: Take 1 tablet (10 mEq total) by mouth daily. Take While taking hydrochlorothiazide/HCTZ    Dispense:  30 tablet    Refill:  2   nitroGLYCERIN (NITROSTAT) 0.4 MG SL tablet    Sig: Place 1 tablet (0.4 mg total) under the tongue every 5 (five) minutes x 3 doses as needed for chest pain.    Dispense:  25 tablet    Refill:  3   lisinopril (ZESTRIL) 20 MG tablet    Sig: Take 1 tablet (20 mg total) by mouth daily.    Dispense:  90 tablet    Refill:  2   pantoprazole (PROTONIX) 40 MG tablet    Sig: Take 1 tablet (40 mg  total) by mouth daily.    Dispense:  30 tablet    Refill:  6   Blood Pressure Monitor DEVI    Sig: 1 each by Does not apply route daily.    Dispense:  1 each    Refill:  0    Patient Instructions  Medication Instructions:  Your physician recommends that you continue on your current medications as directed. Please refer to the Current Medication list given to you today.  Labwork: none  Testing/Procedures: none  Follow-Up: Your physician recommends that you schedule a follow-up appointment in: 6 months with Philis Nettle  Any Other Special Instructions Will Be Listed Below (If Applicable). Your physician has requested that you regularly monitor and  record your blood pressure readings at home. Please use the same machine at the same time of day to check your readings and record them to bring to your follow-up visit Blood Pressure Record Sheet To take your blood pressure, you will need a blood pressure machine. You may be prescribed one, or you can buy a blood pressure machine (blood pressure monitor) at your clinic, drug store, or online. When choosing one, look for these features: An automatic monitor that has an arm cuff. A cuff that wraps snugly, but not too tightly, around your upper arm. You should be able to fit only one finger between your arm and the cuff. A device that stores blood pressure reading results. Do not choose a monitor that measures your blood pressure from your wrist or finger. Follow your health care provider's instructions for how to take your blood pressure. To use this form: Get one reading in the morning (a.m.) before you take any medicines. Get one reading in the evening (p.m.) before supper. Take at least two readings with each blood pressure check. This makes sure the results are correct. Wait 1-2 minutes between measurements. Write down the results in the spaces on this form. Repeat this once a week, or as told by your health care provider. Make a follow-up  appointment with your health care provider to discuss the results. Blood pressure log Date: _______________________ a.m. _____________________(1st reading) _____________________(2nd reading) p.m. _____________________(1st reading) _____________________(2nd reading) Date: _______________________ a.m. _____________________(1st reading) _____________________(2nd reading) p.m. _____________________(1st reading) _____________________(2nd reading) Date: _______________________ a.m. _____________________(1st reading) _____________________(2nd reading) p.m. _____________________(1st reading) _____________________(2nd reading) Date: _______________________ a.m. _____________________(1st reading) _____________________(2nd reading) p.m. _____________________(1st reading) _____________________(2nd reading) Date: _______________________ a.m. _____________________(1st reading) _____________________(2nd reading) p.m. _____________________(1st reading) _____________________(2nd reading) This information is not intended to replace advice given to you by your health care provider. Make sure you discuss any questions you have with your health care provider. Document Revised: 07/23/2021 Document Reviewed: 07/23/2021 Elsevier Patient Education  2024 [employer].   If you need a refill on your cardiac medications before your next appointment, please call your pharmacy.    Signed, Sharlene Dory, NP  05/12/2023 2:07 PM    [facility]

## 2023-05-12 NOTE — Patient Instructions (Addendum)
Medication Instructions:  Your physician recommends that you continue on your current medications as directed. Please refer to the Current Medication list given to you today.  Labwork: none  Testing/Procedures: none  Follow-Up: Your physician recommends that you schedule a follow-up appointment in: 6 months with Philis Nettle  Any Other Special Instructions Will Be Listed Below (If Applicable). Your physician has requested that you regularly monitor and record your blood pressure readings at home. Please use the same machine at the same time of day to check your readings and record them to bring to your follow-up visit Blood Pressure Record Sheet To take your blood pressure, you will need a blood pressure machine. You may be prescribed one, or you can buy a blood pressure machine (blood pressure monitor) at your clinic, drug store, or online. When choosing one, look for these features: An automatic monitor that has an arm cuff. A cuff that wraps snugly, but not too tightly, around your upper arm. You should be able to fit only one finger between your arm and the cuff. A device that stores blood pressure reading results. Do not choose a monitor that measures your blood pressure from your wrist or finger. Follow your health care provider's instructions for how to take your blood pressure. To use this form: Get one reading in the morning (a.m.) before you take any medicines. Get one reading in the evening (p.m.) before supper. Take at least two readings with each blood pressure check. This makes sure the results are correct. Wait 1-2 minutes between measurements. Write down the results in the spaces on this form. Repeat this once a week, or as told by your health care provider. Make a follow-up appointment with your health care provider to discuss the results. Blood pressure log Date: _______________________ a.m. _____________________(1st reading) _____________________(2nd reading) p.m.  _____________________(1st reading) _____________________(2nd reading) Date: _______________________ a.m. _____________________(1st reading) _____________________(2nd reading) p.m. _____________________(1st reading) _____________________(2nd reading) Date: _______________________ a.m. _____________________(1st reading) _____________________(2nd reading) p.m. _____________________(1st reading) _____________________(2nd reading) Date: _______________________ a.m. _____________________(1st reading) _____________________(2nd reading) p.m. _____________________(1st reading) _____________________(2nd reading) Date: _______________________ a.m. _____________________(1st reading) _____________________(2nd reading) p.m. _____________________(1st reading) _____________________(2nd reading) This information is not intended to replace advice given to you by your health care provider. Make sure you discuss any questions you have with your health care provider. Document Revised: 07/23/2021 Document Reviewed: 07/23/2021 Elsevier Patient Education  2024 [employer].   If you need a refill on your cardiac medications before your next appointment, please call your pharmacy.

## 2023-05-17 ENCOUNTER — Telehealth: Payer: Self-pay

## 2023-05-17 ENCOUNTER — Ambulatory Visit: Payer: 59 | Admitting: Orthopedic Surgery

## 2023-05-17 NOTE — Telephone Encounter (Signed)
Patient left message wanting to reschedule her appointment, returned her call and had to leave message.

## 2023-05-18 DIAGNOSIS — Z7182 Exercise counseling: Secondary | ICD-10-CM | POA: Diagnosis not present

## 2023-05-18 DIAGNOSIS — Z713 Dietary counseling and surveillance: Secondary | ICD-10-CM | POA: Diagnosis not present

## 2023-05-18 DIAGNOSIS — I10 Essential (primary) hypertension: Secondary | ICD-10-CM | POA: Diagnosis not present

## 2023-05-18 DIAGNOSIS — E785 Hyperlipidemia, unspecified: Secondary | ICD-10-CM | POA: Diagnosis not present

## 2023-05-18 DIAGNOSIS — I2510 Atherosclerotic heart disease of native coronary artery without angina pectoris: Secondary | ICD-10-CM | POA: Diagnosis not present

## 2023-05-18 DIAGNOSIS — E669 Obesity, unspecified: Secondary | ICD-10-CM | POA: Diagnosis not present

## 2023-05-18 DIAGNOSIS — G4700 Insomnia, unspecified: Secondary | ICD-10-CM | POA: Diagnosis not present

## 2023-05-18 DIAGNOSIS — Z79899 Other long term (current) drug therapy: Secondary | ICD-10-CM | POA: Diagnosis not present

## 2023-05-18 DIAGNOSIS — E559 Vitamin D deficiency, unspecified: Secondary | ICD-10-CM | POA: Diagnosis not present

## 2023-05-18 DIAGNOSIS — E1165 Type 2 diabetes mellitus with hyperglycemia: Secondary | ICD-10-CM | POA: Diagnosis not present

## 2023-06-06 DIAGNOSIS — R531 Weakness: Secondary | ICD-10-CM | POA: Diagnosis not present

## 2023-06-06 DIAGNOSIS — I700 Atherosclerosis of aorta: Secondary | ICD-10-CM | POA: Diagnosis not present

## 2023-06-06 DIAGNOSIS — N179 Acute kidney failure, unspecified: Secondary | ICD-10-CM | POA: Diagnosis not present

## 2023-06-06 DIAGNOSIS — N3000 Acute cystitis without hematuria: Secondary | ICD-10-CM | POA: Diagnosis not present

## 2023-06-07 DIAGNOSIS — I252 Old myocardial infarction: Secondary | ICD-10-CM | POA: Diagnosis not present

## 2023-06-07 DIAGNOSIS — N3000 Acute cystitis without hematuria: Secondary | ICD-10-CM | POA: Diagnosis not present

## 2023-06-07 DIAGNOSIS — Z79899 Other long term (current) drug therapy: Secondary | ICD-10-CM | POA: Diagnosis not present

## 2023-06-07 DIAGNOSIS — Z792 Long term (current) use of antibiotics: Secondary | ICD-10-CM | POA: Diagnosis not present

## 2023-06-07 DIAGNOSIS — I700 Atherosclerosis of aorta: Secondary | ICD-10-CM | POA: Diagnosis not present

## 2023-06-07 DIAGNOSIS — R109 Unspecified abdominal pain: Secondary | ICD-10-CM | POA: Diagnosis not present

## 2023-06-07 DIAGNOSIS — Z635 Disruption of family by separation and divorce: Secondary | ICD-10-CM | POA: Diagnosis not present

## 2023-06-07 DIAGNOSIS — R531 Weakness: Secondary | ICD-10-CM | POA: Diagnosis not present

## 2023-06-07 DIAGNOSIS — N390 Urinary tract infection, site not specified: Secondary | ICD-10-CM | POA: Diagnosis not present

## 2023-06-07 DIAGNOSIS — Z1152 Encounter for screening for COVID-19: Secondary | ICD-10-CM | POA: Diagnosis not present

## 2023-06-07 DIAGNOSIS — N179 Acute kidney failure, unspecified: Secondary | ICD-10-CM | POA: Diagnosis not present

## 2023-06-07 DIAGNOSIS — Z955 Presence of coronary angioplasty implant and graft: Secondary | ICD-10-CM | POA: Diagnosis not present

## 2023-06-07 DIAGNOSIS — I2510 Atherosclerotic heart disease of native coronary artery without angina pectoris: Secondary | ICD-10-CM | POA: Diagnosis not present

## 2023-06-07 DIAGNOSIS — M545 Low back pain, unspecified: Secondary | ICD-10-CM | POA: Diagnosis not present

## 2023-06-07 DIAGNOSIS — E119 Type 2 diabetes mellitus without complications: Secondary | ICD-10-CM | POA: Diagnosis not present

## 2023-06-07 DIAGNOSIS — Z7984 Long term (current) use of oral hypoglycemic drugs: Secondary | ICD-10-CM | POA: Diagnosis not present

## 2023-06-07 DIAGNOSIS — Z794 Long term (current) use of insulin: Secondary | ICD-10-CM | POA: Diagnosis not present

## 2023-06-07 DIAGNOSIS — E860 Dehydration: Secondary | ICD-10-CM | POA: Diagnosis not present

## 2023-06-07 DIAGNOSIS — E1165 Type 2 diabetes mellitus with hyperglycemia: Secondary | ICD-10-CM | POA: Diagnosis not present

## 2023-06-07 DIAGNOSIS — E872 Acidosis, unspecified: Secondary | ICD-10-CM | POA: Diagnosis not present

## 2023-06-07 DIAGNOSIS — E785 Hyperlipidemia, unspecified: Secondary | ICD-10-CM | POA: Diagnosis not present

## 2023-06-09 DIAGNOSIS — R531 Weakness: Secondary | ICD-10-CM | POA: Diagnosis not present

## 2023-06-09 DIAGNOSIS — N179 Acute kidney failure, unspecified: Secondary | ICD-10-CM | POA: Diagnosis not present

## 2023-06-09 DIAGNOSIS — E872 Acidosis, unspecified: Secondary | ICD-10-CM | POA: Diagnosis not present

## 2023-06-09 DIAGNOSIS — N390 Urinary tract infection, site not specified: Secondary | ICD-10-CM | POA: Diagnosis not present

## 2023-06-09 DIAGNOSIS — E119 Type 2 diabetes mellitus without complications: Secondary | ICD-10-CM | POA: Diagnosis not present

## 2023-06-16 ENCOUNTER — Other Ambulatory Visit: Payer: Self-pay

## 2023-06-16 ENCOUNTER — Encounter (HOSPITAL_COMMUNITY): Payer: Self-pay | Admitting: Internal Medicine

## 2023-06-16 ENCOUNTER — Observation Stay (HOSPITAL_COMMUNITY)
Admission: EM | Admit: 2023-06-16 | Discharge: 2023-06-18 | Disposition: A | Discharge status: Home / Self Care | Payer: 59 | Attending: Internal Medicine | Admitting: Internal Medicine

## 2023-06-16 DIAGNOSIS — Z7902 Long term (current) use of antithrombotics/antiplatelets: Secondary | ICD-10-CM | POA: Diagnosis not present

## 2023-06-16 DIAGNOSIS — E1159 Type 2 diabetes mellitus with other circulatory complications: Secondary | ICD-10-CM

## 2023-06-16 DIAGNOSIS — I2510 Atherosclerotic heart disease of native coronary artery without angina pectoris: Secondary | ICD-10-CM | POA: Diagnosis not present

## 2023-06-16 DIAGNOSIS — Z955 Presence of coronary angioplasty implant and graft: Secondary | ICD-10-CM | POA: Insufficient documentation

## 2023-06-16 DIAGNOSIS — Z7984 Long term (current) use of oral hypoglycemic drugs: Secondary | ICD-10-CM | POA: Diagnosis not present

## 2023-06-16 DIAGNOSIS — R531 Weakness: Secondary | ICD-10-CM | POA: Diagnosis not present

## 2023-06-16 DIAGNOSIS — I10 Essential (primary) hypertension: Secondary | ICD-10-CM | POA: Diagnosis present

## 2023-06-16 DIAGNOSIS — N181 Chronic kidney disease, stage 1: Secondary | ICD-10-CM

## 2023-06-16 DIAGNOSIS — N179 Acute kidney failure, unspecified: Principal | ICD-10-CM | POA: Diagnosis present

## 2023-06-16 DIAGNOSIS — E119 Type 2 diabetes mellitus without complications: Secondary | ICD-10-CM

## 2023-06-16 DIAGNOSIS — Z794 Long term (current) use of insulin: Secondary | ICD-10-CM | POA: Insufficient documentation

## 2023-06-16 DIAGNOSIS — F418 Other specified anxiety disorders: Secondary | ICD-10-CM | POA: Diagnosis not present

## 2023-06-16 DIAGNOSIS — E875 Hyperkalemia: Secondary | ICD-10-CM | POA: Diagnosis present

## 2023-06-16 DIAGNOSIS — M797 Fibromyalgia: Secondary | ICD-10-CM | POA: Diagnosis not present

## 2023-06-16 DIAGNOSIS — Z1152 Encounter for screening for COVID-19: Secondary | ICD-10-CM | POA: Insufficient documentation

## 2023-06-16 DIAGNOSIS — Z7982 Long term (current) use of aspirin: Secondary | ICD-10-CM | POA: Insufficient documentation

## 2023-06-16 DIAGNOSIS — Z79899 Other long term (current) drug therapy: Secondary | ICD-10-CM | POA: Insufficient documentation

## 2023-06-16 LAB — URINALYSIS, ROUTINE W REFLEX MICROSCOPIC
Bacteria, UA: NONE SEEN
Bilirubin Urine: NEGATIVE
Glucose, UA: >=500 mg/dL — AB
Hgb urine dipstick: NEGATIVE
Ketones, ur: NEGATIVE mg/dL
Leukocytes,Ua: NEGATIVE
Nitrite: NEGATIVE
Protein, ur: NEGATIVE mg/dL
Specific Gravity, Urine: 1.011 (ref 1.005–1.030)
pH: 5 (ref 5.0–8.0)

## 2023-06-16 LAB — GLUCOSE, CAPILLARY
Glucose-Capillary: 177 mg/dL — ABNORMAL HIGH (ref 70–99)
Glucose-Capillary: 204 mg/dL — ABNORMAL HIGH (ref 70–99)

## 2023-06-16 LAB — BASIC METABOLIC PANEL
Anion gap: 11 (ref 5–15)
BUN: 64 mg/dL — ABNORMAL HIGH (ref 6–20)
CO2: 18 mmol/L — ABNORMAL LOW (ref 22–32)
Calcium: 8.9 mg/dL (ref 8.9–10.3)
Chloride: 105 mmol/L (ref 98–111)
Creatinine, Ser: 2.49 mg/dL — ABNORMAL HIGH (ref 0.44–1.00)
GFR, Estimated: 22 mL/min — ABNORMAL LOW (ref >60)
Glucose, Bld: 225 mg/dL — ABNORMAL HIGH (ref 70–99)
Potassium: 5.3 mmol/L — ABNORMAL HIGH (ref 3.5–5.1)
Sodium: 134 mmol/L — ABNORMAL LOW (ref 135–145)

## 2023-06-16 LAB — CBG MONITORING, ED
Glucose-Capillary: 236 mg/dL — ABNORMAL HIGH (ref 70–99)
Glucose-Capillary: 241 mg/dL — ABNORMAL HIGH (ref 70–99)

## 2023-06-16 LAB — CBC
HCT: 34.3 % — ABNORMAL LOW (ref 36.0–46.0)
Hemoglobin: 10.7 g/dL — ABNORMAL LOW (ref 12.0–15.0)
MCH: 26 pg (ref 26.0–34.0)
MCHC: 31.2 g/dL (ref 30.0–36.0)
MCV: 83.3 fL (ref 80.0–100.0)
Platelets: 230 10*3/uL (ref 150–400)
RBC: 4.12 MIL/uL (ref 3.87–5.11)
RDW: 16.7 % — ABNORMAL HIGH (ref 11.5–15.5)
WBC: 4.4 10*3/uL (ref 4.0–10.5)
nRBC: 0 % (ref 0.0–0.2)

## 2023-06-16 LAB — MAGNESIUM: Magnesium: 2.2 mg/dL (ref 1.7–2.4)

## 2023-06-16 LAB — SARS CORONAVIRUS 2 BY RT PCR: SARS Coronavirus 2 by RT PCR: NEGATIVE

## 2023-06-16 MED ORDER — HEPARIN SODIUM (PORCINE) 5000 UNIT/ML IJ SOLN
5000.0000 [IU] | Freq: Three times a day (TID) | INTRAMUSCULAR | Status: DC
  Administered 2023-06-16 – 2023-06-18 (×5): 5000 [IU] via SUBCUTANEOUS
  Filled 2023-06-16 (×5): qty 1

## 2023-06-16 MED ORDER — ACETAMINOPHEN 325 MG PO TABS: 650.0000 mg | ORAL_TABLET | Freq: Four times a day (QID) | ORAL | Status: DC | PRN

## 2023-06-16 MED ORDER — ACETAMINOPHEN 650 MG RE SUPP: 650.0000 mg | Freq: Four times a day (QID) | RECTAL | Status: DC | PRN

## 2023-06-16 MED ORDER — ASPIRIN 81 MG PO CHEW
81.0000 mg | CHEWABLE_TABLET | Freq: Every day | ORAL | Status: DC
  Administered 2023-06-16 – 2023-06-18 (×3): 81 mg via ORAL
  Filled 2023-06-16 (×3): qty 1

## 2023-06-16 MED ORDER — ENSURE ENLIVE PO LIQD: 237.0000 mL | Freq: Two times a day (BID) | ORAL | Status: DC

## 2023-06-16 MED ORDER — CARVEDILOL 12.5 MG PO TABS
25.0000 mg | ORAL_TABLET | Freq: Two times a day (BID) | ORAL | Status: DC
  Administered 2023-06-16 – 2023-06-18 (×4): 25 mg via ORAL
  Filled 2023-06-16 (×4): qty 2

## 2023-06-16 MED ORDER — PANTOPRAZOLE SODIUM 40 MG PO TBEC
40.0000 mg | DELAYED_RELEASE_TABLET | Freq: Every day | ORAL | Status: DC
  Administered 2023-06-16 – 2023-06-18 (×3): 40 mg via ORAL
  Filled 2023-06-16 (×3): qty 1

## 2023-06-16 MED ORDER — ONDANSETRON HCL 4 MG/2ML IJ SOLN: 4.0000 mg | Freq: Four times a day (QID) | INTRAMUSCULAR | Status: DC | PRN

## 2023-06-16 MED ORDER — INSULIN ASPART 100 UNIT/ML IV SOLN
5.0000 [IU] | Freq: Once | INTRAVENOUS | Status: AC
  Administered 2023-06-16: 5 [IU] via INTRAVENOUS

## 2023-06-16 MED ORDER — SODIUM CHLORIDE 0.9 % IV BOLUS
1000.0000 mL | Freq: Once | INTRAVENOUS | Status: AC
  Administered 2023-06-16: 1000 mL via INTRAVENOUS

## 2023-06-16 MED ORDER — DEXTROSE 50 % IV SOLN
1.0000 | Freq: Once | INTRAVENOUS | Status: AC
  Administered 2023-06-16: 50 mL via INTRAVENOUS
  Filled 2023-06-16: qty 50

## 2023-06-16 MED ORDER — SODIUM CHLORIDE 0.9 % IV SOLN: INTRAVENOUS | Status: AC

## 2023-06-16 MED ORDER — ISOSORBIDE MONONITRATE ER 60 MG PO TB24
60.0000 mg | ORAL_TABLET | Freq: Every day | ORAL | Status: DC
  Administered 2023-06-16 – 2023-06-18 (×3): 60 mg via ORAL
  Filled 2023-06-16 (×3): qty 1

## 2023-06-16 MED ORDER — ATORVASTATIN CALCIUM 40 MG PO TABS
80.0000 mg | ORAL_TABLET | Freq: Every day | ORAL | Status: DC
  Administered 2023-06-17 – 2023-06-18 (×2): 80 mg via ORAL
  Filled 2023-06-16 (×2): qty 2

## 2023-06-16 MED ORDER — CLOPIDOGREL BISULFATE 75 MG PO TABS
75.0000 mg | ORAL_TABLET | Freq: Every day | ORAL | Status: DC
  Administered 2023-06-17 – 2023-06-18 (×2): 75 mg via ORAL
  Filled 2023-06-16 (×2): qty 1

## 2023-06-16 MED ORDER — INSULIN ASPART 100 UNIT/ML IJ SOLN
0.0000 [IU] | Freq: Three times a day (TID) | INTRAMUSCULAR | Status: DC
  Administered 2023-06-17: 2 [IU] via SUBCUTANEOUS
  Administered 2023-06-17: 3 [IU] via SUBCUTANEOUS
  Administered 2023-06-17: 5 [IU] via SUBCUTANEOUS
  Administered 2023-06-18: 3 [IU] via SUBCUTANEOUS

## 2023-06-16 MED ORDER — ONDANSETRON HCL 4 MG PO TABS: 4.0000 mg | ORAL_TABLET | Freq: Four times a day (QID) | ORAL | Status: DC | PRN

## 2023-06-16 MED ORDER — ALBUTEROL SULFATE (2.5 MG/3ML) 0.083% IN NEBU: 3.0000 mL | INHALATION_SOLUTION | RESPIRATORY_TRACT | Status: DC | PRN

## 2023-06-16 MED ORDER — TOPIRAMATE 25 MG PO TABS
50.0000 mg | ORAL_TABLET | Freq: Two times a day (BID) | ORAL | Status: DC
  Administered 2023-06-16 – 2023-06-18 (×4): 50 mg via ORAL
  Filled 2023-06-16 (×4): qty 2

## 2023-06-16 MED ORDER — INSULIN ASPART PROT & ASPART (70-30 MIX) 100 UNIT/ML ~~LOC~~ SUSP
15.0000 [IU] | Freq: Two times a day (BID) | SUBCUTANEOUS | Status: DC
  Administered 2023-06-17 – 2023-06-18 (×3): 15 [IU] via SUBCUTANEOUS
  Filled 2023-06-16: qty 10

## 2023-06-16 MED ORDER — POLYETHYLENE GLYCOL 3350 17 G PO PACK: 17.0000 g | PACK | Freq: Every day | ORAL | Status: DC | PRN

## 2023-06-16 MED ORDER — SERTRALINE HCL 50 MG PO TABS
150.0000 mg | ORAL_TABLET | Freq: Every day | ORAL | Status: DC
  Administered 2023-06-16 – 2023-06-17 (×2): 150 mg via ORAL
  Filled 2023-06-16 (×2): qty 3

## 2023-06-16 MED ORDER — INSULIN ASPART 100 UNIT/ML IJ SOLN
0.0000 [IU] | Freq: Every day | INTRAMUSCULAR | Status: DC
  Administered 2023-06-16: 2 [IU] via SUBCUTANEOUS

## 2023-06-16 MED ORDER — NITROGLYCERIN 0.4 MG SL SUBL: 0.4000 mg | SUBLINGUAL_TABLET | SUBLINGUAL | Status: DC | PRN

## 2023-06-16 NOTE — Assessment & Plan Note (Addendum)
Creatinine 2.49, with potassium of 5.3.  Baseline normal at 0.6-0.9.  Likely prerenal from poor oral intake, while on HCTZ and lisinopril.  Recent hospitalization at [facility]- R are 7/15 to 7/18 for same. Cr on discharge 0.9. -1 L n/s bolus given, continue N/s 100cc/hr x 20hr -HCTZ, lisinopril

## 2023-06-16 NOTE — Assessment & Plan Note (Signed)
Stable. -Resume carvedilol -Hold HCTZ and lisinopril with AKI

## 2023-06-16 NOTE — Assessment & Plan Note (Signed)
Uncontrolled.  A1c 9.8. - SSI- M. -Resume home 7030 at 15 units twice daily -Patient took herself off metformin due to diarrhea

## 2023-06-16 NOTE — H&P (Addendum)
History and Physical    Amy Snow:[id number] DOB: [birth year] DOA: 06/16/2023  PCP: Shelby Dubin, FNP   Patient coming from: Home  I have personally briefly reviewed patient's old medical records in [facility]  Chief Complaint: Weakness  HPI: Amy Snow is a 57 y.o. female with medical history significant for  DM, HTN, STEMI, Ischemic cardiomyopathy. Patient presented to the ED with complaints of generalized weakness, poor oral intake over the past 3 weeks.  She was recently admitted at [facility] from 7/15 to 7/18 for AKI and hyperkalemia, creatinine up to 2.85, potassium of 5.4, creatinine was down to 0.9 on discharge. She reports since discharge, she has still been weak, with poor oral intake, she has also been taking her HCTZ and lisinopril.  Denies vomiting, she had diarrhea prior to recent hospitalization at [facility], but this has resolved. She denies fevers, no cough, no difficulty breathing, no chest pain, no urinary symptoms.  Apart from her mammograms-( says [facility] does not cover mammograms), she reports she has kept up with her routine health screenings. She reports she did not go back to [facility] because did not take her [employer].  ED Course: Tmax 99.3.  Heart rate 65.  Respirate rate 19-20.  Blood pressure systolic 113-1 24.  Creatinine elevated 2.49, potassium 5.3.  UA not suggestive of UTI.   1 L bolus normal saline given.  D50 and NovoLog 5 units given for hyperkalemia.  Hospitalist to admit for AKI.  Review of Systems: As per HPI all other systems reviewed and negative.  Past Medical History:  Diagnosis Date   Anxiety and depression    CAD (coronary artery disease)    a. s/p STEMI 2016 s/p DES x 2 PDA with residual dz treated medically. b. [place] in 2020 with cath s/p DES to long high grade lesion in LAD, residual disease treated medically, EF 50%.   Diabetes mellitus, type II (HCC)    Family history of adverse reaction to anesthesia     PONV   Fibromyalgia    GERD (gastroesophageal reflux disease)    Hyperlipidemia    Hypertension    Ischemic cardiomyopathy    a. EF <25% in 2016, improved to normal thereafter. b. EF 50% in 05/2019.   Myocardial infarct [facility])    august 2016    Past Surgical History:  Procedure Laterality Date   BIOPSY  08/15/2020   Procedure: BIOPSY;  Surgeon: Dolores Frame, MD;  Location: [facility];  Service: Gastroenterology;;   CARDIAC CATHETERIZATION N/A 07/05/2015   Procedure: Left Heart Cath and Coronary Angiography;  Surgeon: Runell Gess, MD;  Location: [facility] INVASIVE CV LAB;  Service: Cardiovascular;  Laterality: N/A;   CESAREAN SECTION     COLONOSCOPY WITH PROPOFOL N/A 08/15/2020   Procedure: COLONOSCOPY WITH PROPOFOL;  Surgeon: Dolores Frame, MD;  Location: [facility];  Service: Gastroenterology;  Laterality: N/A;  1230   CORONARY STENT INTERVENTION N/A 05/24/2019   Procedure: CORONARY STENT INTERVENTION;  Surgeon: Runell Gess, MD;  Location: [facility] INVASIVE CV LAB;  Service: Cardiovascular;  Laterality: N/A;   ESOPHAGEAL DILATION N/A 08/15/2020   Procedure: ESOPHAGEAL DILATION;  Surgeon: Dolores Frame, MD;  Location: [facility];  Service: Gastroenterology;  Laterality: N/A;   ESOPHAGOGASTRODUODENOSCOPY (EGD) WITH PROPOFOL N/A 08/15/2020   Procedure: ESOPHAGOGASTRODUODENOSCOPY (EGD) WITH PROPOFOL;  Surgeon: Dolores Frame, MD;  Location: [facility];  Service: Gastroenterology;  Laterality: N/A;   HERNIA REPAIR Left  x2 ventral and inguinal   LEFT HEART CATH AND CORONARY ANGIOGRAPHY N/A 05/24/2019   Procedure: LEFT HEART CATH AND CORONARY ANGIOGRAPHY;  Surgeon: Runell Gess, MD;  Location: [facility] INVASIVE CV LAB;  Service: Cardiovascular;  Laterality: N/A;   LEFT HEART CATH AND CORONARY ANGIOGRAPHY N/A 05/05/2021   Procedure: LEFT HEART CATH AND CORONARY ANGIOGRAPHY;  Surgeon: Lennette Bihari, MD;  Location: [facility] INVASIVE CV LAB;  Service:  Cardiovascular;  Laterality: N/A;   POLYPECTOMY  08/15/2020   Procedure: POLYPECTOMY;  Surgeon: Dolores Frame, MD;  Location: [facility];  Service: Gastroenterology;;   TONSILLECTOMY     TUBAL LIGATION       reports that she has never smoked. She has never been exposed to tobacco smoke. She has never used smokeless tobacco. She reports current alcohol use. She reports that she does not use drugs.  No Known Allergies  Family History  Problem Relation Age of Onset   Alcohol abuse Mother    Anxiety disorder Father    Depression Father    Alcohol abuse Father    Anxiety disorder Sister    Depression Sister    Depression Maternal Aunt    Depression Cousin     Prior to Admission medications   Medication Sig Start Date End Date Taking? Authorizing Provider  acetaminophen (TYLENOL) 650 MG CR tablet Take 650-1,300 mg by mouth every 8 (eight) hours as needed for pain.     [provider]  albuterol (VENTOLIN HFA) 108 (90 Base) MCG/ACT inhaler Inhale 2 puffs into the lungs every 4 (four) hours as needed for wheezing or shortness of breath (cough). 07/24/22   Shon Hale, MD  amitriptyline (ELAVIL) 50 MG tablet Take 50 mg by mouth at bedtime. 06/15/21   [provider]  aspirin 81 MG chewable tablet Chew 1 tablet (81 mg total) by mouth daily. 05/15/19   Barrett, Joline Salt, PA-C  atorvastatin (LIPITOR) 80 MG tablet Take 1 tablet (80 mg total) by mouth daily. 05/12/23   Sharlene Dory, NP  Blood Pressure Monitor DEVI 1 each by Does not apply route daily. 05/12/23   Sharlene Dory, NP  carvedilol (COREG) 25 MG tablet Take 1 tablet (25 mg total) by mouth 2 (two) times daily. 05/12/23   Sharlene Dory, NP  clopidogrel (PLAVIX) 75 MG tablet Take 1 tablet (75 mg total) by mouth daily. Please schedule appointment for further refills 05/12/23   Sharlene Dory, NP  cyclobenzaprine (FLEXERIL) 10 MG tablet TAKE 1 TABLET BY MOUTH TWICE DAILY AS NEEDED FOR MUSCLE SPASM 05/02/23    Oliver Barre, MD  hydrochlorothiazide (MICROZIDE) 12.5 MG capsule TAKE 1 CAPSULE BY MOUTH ONCE DAILY-TAKE AN EXTRA CAPSULE DAILY AS NEEDED FOR WEIGHT GAIN OF 3 LBS IN ONE DAY OR 5 LBS IN ONE WEEK 05/12/23   Sharlene Dory, NP  insulin NPH-regular Human (70-30) 100 UNIT/ML injection Inject 80 Units into the skin 2 (two) times a day.     [provider]  isosorbide mononitrate (IMDUR) 60 MG 24 hr tablet Take 1 tablet (60 mg total) by mouth daily. 07/26/22   Shon Hale, MD  lisinopril (ZESTRIL) 20 MG tablet Take 1 tablet (20 mg total) by mouth daily. 05/12/23   Sharlene Dory, NP  meclizine (ANTIVERT) 12.5 MG tablet Take 12.5 mg by mouth every 6 (six) hours as needed. 05/26/21   [provider]  metFORMIN (GLUCOPHAGE) 500 MG tablet Take 500 mg by mouth 2 (two) times daily. 06/15/21   [provider]  nitroGLYCERIN (NITROSTAT) 0.4 MG SL tablet Place 1 tablet (0.4 mg total) under the tongue every 5 (five) minutes x 3 doses as needed for chest pain. 05/12/23   Sharlene Dory, NP  ondansetron (ZOFRAN) 4 MG tablet Take 4 mg by mouth every 8 (eight) hours as needed. 05/26/21   [provider]  pantoprazole (PROTONIX) 40 MG tablet Take 1 tablet (40 mg total) by mouth daily. 05/12/23   Sharlene Dory, NP  potassium chloride (KLOR-CON) 10 MEQ tablet Take 1 tablet (10 mEq total) by mouth daily. Take While taking hydrochlorothiazide/HCTZ 05/12/23   Sharlene Dory, NP  sertraline (ZOLOFT) 100 MG tablet Take 150 mg by mouth at bedtime.  01/09/20   [provider]  topiramate (TOPAMAX) 50 MG tablet Take 50 mg by mouth 2 (two) times daily. 06/15/21   [provider]  traMADol (ULTRAM) 50 MG tablet TAKE 1 TABLET BY MOUTH EVERY 12 HOURS AS NEEDED 05/02/23   Oliver Barre, MD  traZODone (DESYREL) 50 MG tablet Take 50 mg by mouth at bedtime.    [provider]    Physical Exam: Vitals:   06/16/23 1153 06/16/23 1154  BP:  113/68  Pulse:  65  Resp:  19   Temp:  98 F (36.7 C)  TempSrc:  Oral  SpO2:  100%  Weight: 72.6 kg   Height: 4\' 11"  (1.499 m)     Constitutional: NAD, calm, comfortable Vitals:   06/16/23 1153 06/16/23 1154  BP:  113/68  Pulse:  65  Resp:  19  Temp:  98 F (36.7 C)  TempSrc:  Oral  SpO2:  100%  Weight: 72.6 kg   Height: 4\' 11"  (1.499 m)    Eyes: PERRL, lids and conjunctivae normal ENMT: Mucous membranes are dry Neck: normal, supple, no masses, no thyromegaly Respiratory: clear to auscultation bilaterally, no wheezing, no crackles. Normal respiratory effort. No accessory muscle use.  Cardiovascular: Regular rate and rhythm, no murmurs / rubs / gallops. No extremity edema.  Extremities warm Abdomen: no tenderness, no masses palpated. No hepatosplenomegaly. Bowel sounds positive.  Musculoskeletal: no clubbing / cyanosis. No joint deformity upper and lower extremities.  Skin: no rashes, lesions, ulcers. No induration Neurologic: No Facial symmetry, speech fluent, moving extremities spontaneously.  Psychiatric: Normal judgment and insight. Alert and oriented x 3. Normal mood.   Labs on Admission: I have personally reviewed following labs and imaging studies  CBC: Recent Labs  Lab 06/16/23 1229  WBC 4.4  HGB 10.7*  HCT 34.3*  MCV 83.3  PLT 230   Basic Metabolic Panel: Recent Labs  Lab 06/16/23 1229  NA 134*  K 5.3*  CL 105  CO2 18*  GLUCOSE 225*  BUN 64*  CREATININE 2.49*  CALCIUM 8.9  MG 2.2   CBG: Recent Labs  Lab 06/16/23 1222 06/16/23 1538  GLUCAP 236* 241*   Urine analysis:    Component Value Date/Time   COLORURINE YELLOW 06/16/2023 1328   APPEARANCEUR CLEAR 06/16/2023 1328   LABSPEC 1.011 06/16/2023 1328   PHURINE 5.0 06/16/2023 1328   GLUCOSEU >=500 (A) 06/16/2023 1328   HGBUR NEGATIVE 06/16/2023 1328   BILIRUBINUR NEGATIVE 06/16/2023 1328   KETONESUR NEGATIVE 06/16/2023 1328   PROTEINUR NEGATIVE 06/16/2023 1328   NITRITE NEGATIVE 06/16/2023 1328   LEUKOCYTESUR  NEGATIVE 06/16/2023 1328    Radiological Exams on Admission: No results found.  EKG: Independently reviewed.  Sinus rhythm, rate 67, QTc 440.  Nonspecific T wave changes in leads III, aVF and V3.  Assessment/Plan Principal Problem:   AKI (acute kidney injury) (HCC) Active Problems:   Depression with anxiety   Diabetes mellitus (HCC)   Essential hypertension   Fibromyalgia   CAD in native artery   Assessment and Plan: * AKI (acute kidney injury) (HCC) Creatinine 2.49, with potassium of 5.3.  Baseline normal at 0.6-0.9.  Likely prerenal from poor oral intake, while on HCTZ and lisinopril.  Recent hospitalization at [facility]- R are 7/15 to 7/18 for same. Cr on discharge 0.9. -1 L n/s bolus given, continue N/s 100cc/hr x 20hr -HCTZ, lisinopril  CAD in native artery Chest pain.  EKG with nonspecific changes 2-3 aVF V3.  Follows with cardiology.  History of STEMI with DES X 2  - 2016.  Per cardiology notes- 04/2023- Consideration to stop taking Plavix. -Resume Imdur, Coreg, Plavix, aspirin, Plavix, Lipitor  Fibromyalgia Resume Topamax  Essential hypertension Stable. -Resume carvedilol -Hold HCTZ and lisinopril with AKI  Diabetes mellitus (HCC) Uncontrolled.  A1c 9.8. - SSI- M. -Resume home 7030 at 15 units twice daily -Patient took herself off metformin due to diarrhea  Depression with anxiety Resume Zoloft   DVT prophylaxis: Heparin Code Status: FULL code Family Communication: None at bedside Disposition Plan: ~ 1- 2 days Consults called: None  Admission status:  Obs tele   Author: Onnie Boer, MD 06/16/2023 6:18 PM  For on call review [email or web address].

## 2023-06-16 NOTE — ED Provider Notes (Signed)
[place] EMERGENCY DEPARTMENT AT [facility] Provider Note   CSN: [id number] Arrival date & time: 06/16/23  1143     History  Chief Complaint  Patient presents with   Fatigue    Amy Snow is a 57 y.o. female.  HPI     Amy Snow is a 57 y.o. female past medical history of type 2 diabetes, fibromyalgia, hypertension, anxiety, coronary artery disease who presents to the Emergency Department complaining of generalized weakness and fatigue.  Also notes having some myalgias of her upper back.  She was admitted at [facility] ER on 06/06/2023 found to have AKI and UTI with hypomagnesemia.  She notes improvement during her hospital course was discharged home 3 days later.  Since being home, she has been fatigued and having generalized weakness.  Notes decreased appetite and admits to not drinking enough fluids.  Was on metformin for diabetes but stopped due to GI symptoms.  Takes sliding scale insulin.  Denies fever, cough, chest pain or shortness of breath  Home Medications Prior to Admission medications   Medication Sig Start Date End Date Taking? Authorizing Provider  acetaminophen (TYLENOL) 650 MG CR tablet Take 650-1,300 mg by mouth every 8 (eight) hours as needed for pain.     [provider]  albuterol (VENTOLIN HFA) 108 (90 Base) MCG/ACT inhaler Inhale 2 puffs into the lungs every 4 (four) hours as needed for wheezing or shortness of breath (cough). 07/24/22   Shon Hale, MD  amitriptyline (ELAVIL) 50 MG tablet Take 50 mg by mouth at bedtime. 06/15/21   [provider]  aspirin 81 MG chewable tablet Chew 1 tablet (81 mg total) by mouth daily. 05/15/19   Barrett, Joline Salt, PA-C  atorvastatin (LIPITOR) 80 MG tablet Take 1 tablet (80 mg total) by mouth daily. 05/12/23   Sharlene Dory, NP  Blood Pressure Monitor DEVI 1 each by Does not apply route daily. 05/12/23   Sharlene Dory, NP  carvedilol (COREG) 25 MG tablet Take 1 tablet (25 mg total) by  mouth 2 (two) times daily. 05/12/23   Sharlene Dory, NP  clopidogrel (PLAVIX) 75 MG tablet Take 1 tablet (75 mg total) by mouth daily. Please schedule appointment for further refills 05/12/23   Sharlene Dory, NP  cyclobenzaprine (FLEXERIL) 10 MG tablet TAKE 1 TABLET BY MOUTH TWICE DAILY AS NEEDED FOR MUSCLE SPASM 05/02/23   Oliver Barre, MD  hydrochlorothiazide (MICROZIDE) 12.5 MG capsule TAKE 1 CAPSULE BY MOUTH ONCE DAILY-TAKE AN EXTRA CAPSULE DAILY AS NEEDED FOR WEIGHT GAIN OF 3 LBS IN ONE DAY OR 5 LBS IN ONE WEEK 05/12/23   Sharlene Dory, NP  insulin NPH-regular Human (70-30) 100 UNIT/ML injection Inject 80 Units into the skin 2 (two) times a day.     [provider]  isosorbide mononitrate (IMDUR) 60 MG 24 hr tablet Take 1 tablet (60 mg total) by mouth daily. 07/26/22   Shon Hale, MD  lisinopril (ZESTRIL) 20 MG tablet Take 1 tablet (20 mg total) by mouth daily. 05/12/23   Sharlene Dory, NP  meclizine (ANTIVERT) 12.5 MG tablet Take 12.5 mg by mouth every 6 (six) hours as needed. 05/26/21   [provider]  metFORMIN (GLUCOPHAGE) 500 MG tablet Take 500 mg by mouth 2 (two) times daily. 06/15/21   [provider]  nitroGLYCERIN (NITROSTAT) 0.4 MG SL tablet Place 1 tablet (0.4 mg total) under the tongue every 5 (five) minutes x 3 doses as needed for chest pain. 05/12/23  Sharlene Dory, NP  ondansetron (ZOFRAN) 4 MG tablet Take 4 mg by mouth every 8 (eight) hours as needed. 05/26/21   [provider]  pantoprazole (PROTONIX) 40 MG tablet Take 1 tablet (40 mg total) by mouth daily. 05/12/23   Sharlene Dory, NP  potassium chloride (KLOR-CON) 10 MEQ tablet Take 1 tablet (10 mEq total) by mouth daily. Take While taking hydrochlorothiazide/HCTZ 05/12/23   Sharlene Dory, NP  sertraline (ZOLOFT) 100 MG tablet Take 150 mg by mouth at bedtime.  01/09/20   [provider]  topiramate (TOPAMAX) 50 MG tablet Take 50 mg by mouth 2 (two) times daily. 06/15/21    [provider]  traMADol (ULTRAM) 50 MG tablet TAKE 1 TABLET BY MOUTH EVERY 12 HOURS AS NEEDED 05/02/23   Oliver Barre, MD  traZODone (DESYREL) 50 MG tablet Take 50 mg by mouth at bedtime.    [provider]      Allergies    Patient has no known allergies.    Review of Systems   Review of Systems  Constitutional:  Positive for appetite change and fatigue. Negative for chills and fever.  Eyes:  Negative for visual disturbance.  Respiratory:  Negative for cough and shortness of breath.   Cardiovascular:  Negative for chest pain and leg swelling.  Gastrointestinal:  Negative for abdominal pain, diarrhea, nausea and vomiting.  Musculoskeletal:  Positive for myalgias. Negative for neck stiffness.  Skin:  Negative for rash.  Neurological:  Positive for weakness. Negative for dizziness, syncope and headaches.    Physical Exam Updated Vital Signs BP 113/68 (BP Location: Right Arm)   Pulse 65   Temp 98 F (36.7 C) (Oral)   Resp 19   Ht 4\' 11"  (1.499 m)   Wt 72.6 kg   LMP 12/05/2015   SpO2 100%   BMI 32.32 kg/m  Physical Exam Vitals and nursing note reviewed.  Constitutional:      General: She is not in acute distress.    Appearance: Normal appearance. She is not ill-appearing.  HENT:     Mouth/Throat:     Mouth: Mucous membranes are moist.  Eyes:     Extraocular Movements: Extraocular movements intact.     Conjunctiva/sclera: Conjunctivae normal.     Pupils: Pupils are equal, round, and reactive to light.  Cardiovascular:     Rate and Rhythm: Normal rate and regular rhythm.     Pulses: Normal pulses.  Pulmonary:     Effort: Pulmonary effort is normal.  Abdominal:     Palpations: Abdomen is soft.     Tenderness: There is no abdominal tenderness.  Musculoskeletal:     Cervical back: Normal range of motion. No rigidity.     Right lower leg: No edema.     Left lower leg: No edema.  Skin:    General: Skin is warm.     Capillary Refill: Capillary  refill takes less than 2 seconds.     Findings: No erythema or rash.  Neurological:     General: No focal deficit present.     Mental Status: She is alert.     GCS: GCS eye subscore is 4. GCS verbal subscore is 5. GCS motor subscore is 6.     Sensory: Sensation is intact. No sensory deficit.     Motor: Motor function is intact. No weakness.     Coordination: Coordination is intact.     Comments: CN II through XII intact.  Speech clear.  No pronator drift.  ED Results / Procedures / Treatments   Labs (all labs ordered are listed, but only abnormal results are displayed) Labs Reviewed  BASIC METABOLIC PANEL - Abnormal; Notable for the following components:      Result Value   Sodium 134 (*)    Potassium 5.3 (*)    CO2 18 (*)    Glucose, Bld 225 (*)    BUN 64 (*)    Creatinine, Ser 2.49 (*)    GFR, Estimated 22 (*)    All other components within normal limits  CBC - Abnormal; Notable for the following components:   Hemoglobin 10.7 (*)    HCT 34.3 (*)    RDW 16.7 (*)    All other components within normal limits  URINALYSIS, ROUTINE W REFLEX MICROSCOPIC - Abnormal; Notable for the following components:   Glucose, UA >=500 (*)    All other components within normal limits  CBG MONITORING, ED - Abnormal; Notable for the following components:   Glucose-Capillary 236 (*)    All other components within normal limits  SARS CORONAVIRUS 2 BY RT PCR  MAGNESIUM    EKG EKG Interpretation Date/Time:  Thursday June 16 2023 12:06:16 EDT Ventricular Rate:  67 PR Interval:  164 QRS Duration:  98 QT Interval:  416 QTC Calculation: 440 R Axis:   23  Text Interpretation: Sinus rhythm Anterior infarct, old No significant change since last tracing Confirmed by Margarita Grizzle [phone]) on 06/16/2023 2:22:38 PM  Radiology No results found.  Procedures Procedures    CRITICAL CARE Performed by: Kailly Richoux Total critical care time: 35 minutes Critical care time was exclusive of  separately billable procedures and treating other patients. Critical care was necessary to treat or prevent imminent or life-threatening deterioration. Critical care was time spent personally by me on the following activities: development of treatment plan with patient and/or surrogate as well as nursing, discussions with consultants, evaluation of patient's response to treatment, examination of patient, obtaining history from patient or surrogate, ordering and performing treatments and interventions, ordering and review of laboratory studies, ordering and review of radiographic studies, pulse oximetry and re-evaluation of patient's condition.   Medications Ordered in ED Medications  sodium chloride 0.9 % bolus 1,000 mL (0 mLs Intravenous Stopped 06/16/23 1446)  insulin aspart (novoLOG) injection 5 Units (5 Units Intravenous Given 06/16/23 1452)    And  dextrose 50 % solution 50 mL (50 mLs Intravenous Given 06/16/23 1453)    ED Course/ Medical Decision Making/ A&P                             Medical Decision Making Patient here with complaint of generalized weakness and fatigue.  Recently discharged from [facility] ER, was admitted for AKI, hyperkalemia, hypomagnesemia and UTI.  She notes having decreased appetite and admits to not drinking adequate amounts of fluid at home since her discharge.   Clinically, suspect she may have recurrent dehydration.  Will check labs, infectious cause also considered but felt less likely.  Vital signs here are reassuring.  Sepsis also considered but felt less likely.  Amount and/or Complexity of Data Reviewed Labs: ordered.    Details: Labs, no evidence of leukocytosis, chemistries show recurrent hypokalemia with potassium today 5.3, AKI present with BUN of 64 and creatinine of 2.49.  Her creatinine at time of discharge from [facility] ER last week was 0.90, magnesium level unremarkable, COVID-negative.  Urinalysis without evidence of infection. ECG/medicine tests:  ordered.  Details: EKG shows sinus rhythm, anterior infarct old no significant change since last tracing. Discussion of management or test interpretation with external provider(s): Patient here with recurrent AKI and hyperkalemia.  Hyperkalemia treated with IV fluids and insulin.  No EKG changes.  Consulted with [facility] hospitalist, Dr. Mariea Clonts who is agreeable to admit    Risk OTC drugs. Prescription drug management. Decision regarding hospitalization.           Final Clinical Impression(s) / ED Diagnoses Final diagnoses:  AKI (acute kidney injury) [facility])    Rx / DC Orders ED Discharge Orders     None         Pauline Aus, PA-C 06/16/23 1646    Margarita Grizzle, MD 06/18/23 [phone]

## 2023-06-16 NOTE — Assessment & Plan Note (Signed)
Resume Topamax. ?

## 2023-06-16 NOTE — ED Triage Notes (Signed)
Pt presents with fatigue and head and back pain since Tuesday, per pt she was recently admitted at [facility] for AKI and Hyperkalemia, didn't return there d/t them not taking her insurance.

## 2023-06-16 NOTE — Assessment & Plan Note (Signed)
Resume Zoloft 

## 2023-06-16 NOTE — Assessment & Plan Note (Addendum)
Chest pain.  EKG with nonspecific changes 2-3 aVF V3.  Follows with cardiology.  History of STEMI with DES X 2  - 2016.  Per cardiology notes- 04/2023- Consideration to stop taking Plavix. -Resume Imdur, Coreg, Plavix, aspirin, Plavix, Lipitor

## 2023-06-17 DIAGNOSIS — N179 Acute kidney failure, unspecified: Secondary | ICD-10-CM | POA: Diagnosis not present

## 2023-06-17 LAB — GLUCOSE, CAPILLARY
Glucose-Capillary: 167 mg/dL — ABNORMAL HIGH (ref 70–99)
Glucose-Capillary: 238 mg/dL — ABNORMAL HIGH (ref 70–99)
Glucose-Capillary: 142 mg/dL — ABNORMAL HIGH (ref 70–99)
Glucose-Capillary: 106 mg/dL — ABNORMAL HIGH (ref 70–99)

## 2023-06-17 LAB — BASIC METABOLIC PANEL: Potassium: 5.2 mmol/L — ABNORMAL HIGH (ref 3.5–5.1)

## 2023-06-17 LAB — POTASSIUM: Potassium: 5.5 mmol/L — ABNORMAL HIGH (ref 3.5–5.1)

## 2023-06-17 MED ORDER — GLUCERNA SHAKE PO LIQD
237.0000 mL | Freq: Two times a day (BID) | ORAL | Status: DC
  Administered 2023-06-17 (×2): 237 mL via ORAL

## 2023-06-17 MED ORDER — SODIUM ZIRCONIUM CYCLOSILICATE 10 G PO PACK
10.0000 g | PACK | Freq: Once | ORAL | Status: AC
  Administered 2023-06-17: 10 g via ORAL
  Filled 2023-06-17: qty 1

## 2023-06-17 NOTE — Progress Notes (Signed)
   06/17/23 0905  TOC Brief Assessment  Insurance and Status Reviewed  Patient has primary care physician Yes  Home environment has been reviewed from home with family  Prior level of function: independent  Prior/Current Home Services No current home services  Social Determinants of Health Reivew SDOH reviewed no interventions necessary  Readmission risk has been reviewed Yes  Transition of care needs no transition of care needs at this time    Transition of Care Department [facility]) has reviewed patient and no TOC needs have been identified at this time. We will continue to monitor patient advancement through interdisciplinary progression rounds. If new patient transition needs arise, please place a TOC consult.

## 2023-06-17 NOTE — Progress Notes (Signed)
PROGRESS NOTE    Amy Snow  ZOX:[id number] DOB: 01-Nov-1966 DOA: 06/16/2023 PCP: Shelby Dubin, FNP   Brief Narrative: This 57 years old female with PMH significant for diabetes, hypertension, STEMI, ischemic cardiomyopathy presented in the ED with complaints of generalized weakness, decreased oral intake for last 3 weeks.  Patient was recently admitted at [facility] from 06/06/2023 to 06/09/2023 for AKI and hyperkalemia.  Serum creatinine was up to 2.85 and potassium was 5.4.  Serum creatinine down to 0.9 at discharge.  Patient reports she has been feeling very weak since she is discharged and has been taking HCTZ and lisinopril.  She also reported an episode of diarrhea prior to recent hospitalization at [facility] but that has been resolved.  Workup in the ED reveals serum creatinine 2.49, potassium 5.3, UA not suggestive of UTI.  Patient was given fluid resuscitation, D50 and NovoLog for hyperkalemia.  Patient is admitted for acute kidney injury.  Assessment & Plan:   Principal Problem:   AKI (acute kidney injury) (HCC) Active Problems:   Depression with anxiety   Diabetes mellitus (HCC)   Essential hypertension   Fibromyalgia   CAD in native artery  Acute kidney injury: Patient presented with generalized weakness and found to have serum creatinine 2.42 and potassium 5.3. Baseline serum creatinine 0.6-0.9. Likely prerenal from decreased oral intake while on HCTZ and lisinopril. Patient was recently hospitalized at [facility] from 7/15 to 7/18 for AKI and hyperkalemia. Continue IV fluid resuscitation.  Normal saline @ 100 cc/hr Avoid nephrotoxic medications, hold HCTZ and lisinopril Continue to monitor serum creatinine which is improving 2.49 > 1.47  Hyperkalemia: Lokelma given X 1.  Monitor serum potassium.  History of CAD in native artery: Patient denies any chest pain. EKG showed nonspecific changes. Patient follows up with cardiology. Patient has history of STEMI with  DES x 2 in 2016. Continue Imdur, Coreg, aspirin, Plavix and Lipitor.  Fibromyalgia Continue Topamax  Essential hypertension: Blood pressure has been stable. Resume carvedilol Hold HCTZ and lisinopril in the setting of AKI.  Diabetes mellitus , uncontrolled. Hemoglobin A1c 9.3  2 months ago. Continue  Novolog 70/30 15 units twice daily. Regular insulin sliding scale.  Depression with anxiety: Resume Zoloft.   DVT prophylaxis: Heparin Sq Code Status: Full code Family Communication: No family at bed side. Disposition Plan:  Status is: Observation The patient remains OBS appropriate and will d/c before 2 midnights.  Admitted for acute kidney injury likely prerenal in the setting of decreased oral intake and continued use of HCTZ and lisinopril.    Consultants:  None  Procedures: None  Antimicrobials: None   Subjective: Patient was seen and examined at bedside.  Overnight events noted. Patient states she is still weak but improving.  Renal functions are improving.  Objective: Vitals:   06/16/23 1710 06/16/23 2103 06/17/23 0235 06/17/23 0847  BP: 124/71 129/68 92/60 109/65  Pulse: 65 70 64 65  Resp: 20 20 18    Temp: 99.3 F (37.4 C) 98.3 F (36.8 C) 97.8 F (36.6 C) 98.2 F (36.8 C)  TempSrc: Oral Oral Oral Oral  SpO2: 99% 97% 98% 99%  Weight: 70.4 kg     Height: 4\' 11"  (1.499 m)       Intake/Output Summary (Last 24 hours) at 06/17/2023 1102 Last data filed at 06/17/2023 1036 Gross per 24 hour  Intake 2452.27 ml  Output 1600 ml  Net 852.27 ml   Filed Weights   06/16/23 1153 06/16/23 1710  Weight: 72.6  kg 70.4 kg    Examination:  General exam: Appears calm and comfortable, not in any acute distress. Respiratory system: Clear to auscultation. Respiratory effort normal.  RR 15 Cardiovascular system: S1 & S2 heard, RRR. No JVD, murmurs, rubs, gallops or clicks. No pedal edema. Gastrointestinal system: Abdomen is nondistended, soft and nontender. No  organomegaly or masses felt. Normal bowel sounds heard. Central nervous system: Alert and oriented x 3. No focal neurological deficits. Extremities: No edema, no cyanosis, no clubbing Skin: No rashes, lesions or ulcers Psychiatry: Judgement and insight appear normal. Mood & affect appropriate.     Data Reviewed: I have personally reviewed following labs and imaging studies  CBC: Recent Labs  Lab 06/16/23 1229  WBC 4.4  HGB 10.7*  HCT 34.3*  MCV 83.3  PLT 230   Basic Metabolic Panel: Recent Labs  Lab 06/16/23 1229 06/17/23 0413 06/17/23 0901  NA 134* 138  --   K 5.3* 5.2* 5.5*  CL 105 112*  --   CO2 18* 18*  --   GLUCOSE 225* 193*  --   BUN 64* 52*  --   CREATININE 2.49* 1.47*  --   CALCIUM 8.9 8.6*  --   MG 2.2  --   --    GFR: Estimated Creatinine Clearance: 36.9 mL/min (A) (by C-G formula based on SCr of 1.47 mg/dL (H)). Liver Function Tests: No results for input(s): "AST", "ALT", "ALKPHOS", "BILITOT", "PROT", "ALBUMIN" in the last 168 hours. No results for input(s): "LIPASE", "AMYLASE" in the last 168 hours. No results for input(s): "AMMONIA" in the last 168 hours. Coagulation Profile: No results for input(s): "INR", "PROTIME" in the last 168 hours. Cardiac Enzymes: No results for input(s): "CKTOTAL", "CKMB", "CKMBINDEX", "TROPONINI" in the last 168 hours. BNP (last 3 results) No results for input(s): "PROBNP" in the last 8760 hours. HbA1C: No results for input(s): "HGBA1C" in the last 72 hours. CBG: Recent Labs  Lab 06/16/23 1222 06/16/23 1538 06/16/23 1714 06/16/23 2106 06/17/23 0730  GLUCAP 236* 241* 177* 204* 167*   Lipid Profile: No results for input(s): "CHOL", "HDL", "LDLCALC", "TRIG", "CHOLHDL", "LDLDIRECT" in the last 72 hours. Thyroid Function Tests: No results for input(s): "TSH", "T4TOTAL", "FREET4", "T3FREE", "THYROIDAB" in the last 72 hours. Anemia Panel: No results for input(s): "VITAMINB12", "FOLATE", "FERRITIN", "TIBC", "IRON",  "RETICCTPCT" in the last 72 hours. Sepsis Labs: No results for input(s): "PROCALCITON", "LATICACIDVEN" in the last 168 hours.  Recent Results (from the past 240 hour(s))  SARS Coronavirus 2 by RT PCR (hospital order, performed in [facility] hospital lab) *cepheid single result test* Urine, Clean Catch     Status: None   Collection Time: 06/16/23  1:11 PM   Specimen: Urine, Clean Catch; Nasal Swab  Result Value Ref Range Status   SARS Coronavirus 2 by RT PCR NEGATIVE NEGATIVE Final    Comment: (NOTE) SARS-CoV-2 target nucleic acids are NOT DETECTED.  The SARS-CoV-2 RNA is generally detectable in upper and lower respiratory specimens during the acute phase of infection. The lowest concentration of SARS-CoV-2 viral copies this assay can detect is 250 copies / mL. A negative result does not preclude SARS-CoV-2 infection and should not be used as the sole basis for treatment or other patient management decisions.  A negative result may occur with improper specimen collection / handling, submission of specimen other than nasopharyngeal swab, presence of viral mutation(s) within the areas targeted by this assay, and inadequate number of viral copies (<250 copies / mL). A negative result must  be combined with clinical observations, patient history, and epidemiological information.  Fact Sheet for Patients:   RoadLapTop.co.za  Fact Sheet for Healthcare Providers: http://kim-miller.com/  This test is not yet approved or  cleared by the [place] FDA and has been authorized for detection and/or diagnosis of SARS-CoV-2 by FDA under an Emergency Use Authorization (EUA).  This EUA will remain in effect (meaning this test can be used) for the duration of the COVID-19 declaration under Section 564(b)(1) of the Act, 21 U.S.C. section 360bbb-3(b)(1), unless the authorization is terminated or revoked sooner.  Performed at [facility], [address] [place], [place] [address]     Radiology Studies: No results found.  Scheduled Meds:  aspirin  81 mg Oral Daily   atorvastatin  80 mg Oral Daily   carvedilol  25 mg Oral BID   clopidogrel  75 mg Oral Daily   feeding supplement (GLUCERNA SHAKE)  237 mL Oral BID BM   heparin  5,000 Units Subcutaneous Q8H   insulin aspart  0-15 Units Subcutaneous TID WC   insulin aspart  0-5 Units Subcutaneous QHS   insulin aspart protamine- aspart  15 Units Subcutaneous BID WC   isosorbide mononitrate  60 mg Oral Daily   pantoprazole  40 mg Oral Daily   sertraline  150 mg Oral QHS   sodium zirconium cyclosilicate  10 g Oral Once   topiramate  50 mg Oral BID   Continuous Infusions:  sodium chloride 100 mL/hr at 06/17/23 0356     LOS: 0 days    Time spent: 50 mins    Willeen Niece, MD [facility]   If 7PM-7AM, please contact night-coverage

## 2023-06-17 NOTE — Inpatient Diabetes Management (Signed)
Inpatient Diabetes Program Recommendations  AACE/ADA: New Consensus Statement on Inpatient Glycemic Control (2015)  Target Ranges:  Prepandial:   less than 140 mg/dL      Peak postprandial:   less than 180 mg/dL (1-2 hours)      Critically ill patients:  140 - 180 mg/dL   Lab Results  Component Value Date   GLUCAP 238 (H) 06/17/2023   HGBA1C 9.8 (H) 05/05/2021    Review of Glycemic Control  Latest Reference Range & Units 06/16/23 12:22 06/16/23 15:38 06/16/23 17:14 06/16/23 21:06 06/17/23 07:30 06/17/23 11:26  Glucose-Capillary 70 - 99 mg/dL 161 (H) 096 (H) 045 (H) 204 (H) 167 (H) 238 (H)  (H): Data is abnormally high  Diabetes history: DM2 Outpatient Diabetes medications:  Basaglar 20 units at bedtime Novolog 0-11 units TID Current with PCP-Dr, Bucio; last visit was 7/26 Current orders for Inpatient glycemic control:  70/30 15 units BID Novolog 0-15 units TID and 0-5 units QHS  Inpatient Diabetes Program Recommendations:    Spoke with patient over the phone to confirm her outpatient medications.  She used to take 70/30.  She takes Basaglar 20 units at bedtime, Novolog 0-11 units TID.  Might consider:  Semglee 10 units at bedtime Novolog 2 units TID with meals if consumes at least 50%  Will continue to follow while inpatient.  Thank you, Dulce Sellar, MSN, CDCES Diabetes Coordinator Inpatient Diabetes Program [phone] (team pager from 8a-5p)

## 2023-06-17 NOTE — Discharge Instructions (Signed)
Carbohydrate Counting For People With Diabetes  Foods with carbohydrates make your blood glucose level go up. Learning how to count carbohydrates can help you control your blood glucose levels. First, identify the foods you eat that contain carbohydrates. Then, using the Foods with Carbohydrates chart, determine about how much carbohydrates are in your meals and snacks. Make sure you are eating foods with fiber, protein, and healthy fat along with your carbohydrate foods.  Foods with Carbohydrates The following table shows carbohydrate foods that have about 15 grams of carbohydrate each. Using measuring cups, spoons, or a food scale when you first begin learning about carbohydrate counting can help you learn about the portion sizes you typically eat. The following foods have 15 grams carbohydrate each:  Grains 1 slice bread (1 ounce)  1 small tortilla (6-inch size)   large bagel (1 ounce)  1/3 cup pasta or rice (cooked)   hamburger or hot dog bun ( ounce)   cup cooked cereal   to  cup ready-to-eat cereal  2 taco shells (5-inch size) Fruit 1 small fresh fruit ( to 1 cup)   medium banana  17 small grapes (3 ounces)  1 cup melon or berries   cup canned or frozen fruit  2 tablespoons dried fruit (blueberries, cherries, cranberries, raisins)   cup unsweetened fruit juice  Starchy Vegetables  cup cooked beans, peas, corn, potatoes/sweet potatoes   large baked potato (3 ounces)  1 cup acorn or butternut squash  Snack Foods 3 to 6 crackers  8 potato chips or 13 tortilla chips ( ounce to 1 ounce)  3 cups popped popcorn  Dairy 3/4 cup (6 ounces) nonfat plain yogurt, or yogurt with sugar-free sweetener  1 cup milk  1 cup plain rice, soy, coconut or flavored almond milk Sweets and Desserts  cup ice cream or frozen yogurt  1 tablespoon jam, jelly, pancake syrup, table sugar, or honey  2 tablespoons light pancake syrup  1 inch square of frosted cake or 2 inch square of unfrosted  cake  2 small cookies (2/3 ounce each) or  large cookie  Sometimes you'll have to estimate carbohydrate amounts if you don't know the exact recipe. One cup of mixed foods like soups can have 1 to 2 carbohydrate servings, while some casseroles might have 2 or more servings of carbohydrate. Foods that have less than 20 calories in each serving can be counted as "free" foods. Count 1 cup raw vegetables, or  cup cooked non-starchy vegetables as "free" foods. If you eat 3 or more servings at one meal, then count them as 1 carbohydrate serving.   Foods without Carbohydrates  Not all foods contain carbohydrates. Meat, some dairy, fats, non-starchy vegetables, and many beverages don't contain carbohydrate. So when you count carbohydrates, you can generally exclude chicken, pork, beef, fish, seafood, eggs, tofu, cheese, butter, sour cream, avocado, nuts, seeds, olives, mayonnaise, water, black coffee, unsweetened tea, and zero-calorie drinks. Vegetables with no or low carbohydrate include green beans, cauliflower, tomatoes, and onions. How much carbohydrate should I eat at each meal?  Carbohydrate counting can help you plan your meals and manage your weight. Following are some starting points for carbohydrate intake at each meal. Work with your registered dietitian nutritionist to find the best range that works for your blood glucose and weight.   To Lose Weight To Maintain Weight  Women 2 - 3 carb servings 3 - 4 carb servings  Men 3 - 4 carb servings 4 - 5 carb servings  Checking your blood glucose after meals will help you know if you need to adjust the timing, type, or number of carbohydrate servings in your meal plan. Achieve and keep a healthy body weight by balancing your food intake and physical activity.  Tips How should I plan my meals?  Plan for half the food on your plate to include non-starchy vegetables, like salad greens, broccoli, or carrots. Try to eat 3 to 5 servings of non-starchy  vegetables every day. Have a protein food at each meal. Protein foods include chicken, fish, meat, eggs, or beans (note that beans contain carbohydrate). These two food groups (non-starchy vegetables and proteins) are low in carbohydrate. If you fill up your plate with these foods, you will eat less carbohydrate but still fill up your stomach. Try to limit your carbohydrate portion to  of the plate.  What fats are healthiest to eat?  Diabetes increases risk for heart disease. To help protect your heart, eat more healthy fats, such as olive oil, nuts, and avocado. Eat less saturated fats like butter, cream, and high-fat meats, like bacon and sausage. Avoid trans fats, which are in all foods that list "partially hydrogenated oil" as an ingredient. What should I drink?  Choose drinks that are not sweetened with sugar. The healthiest choices are water, carbonated or seltzer waters, and tea and coffee without added sugars.  Sweet drinks will make your blood glucose go up very quickly. One serving of soda or energy drink is  cup. It is best to drink these beverages only if your blood glucose is low.  Artificially sweetened, or diet drinks, typically do not increase your blood glucose if they have zero calories in them. Read labels of beverages, as some diet drinks do have carbohydrate and will raise your blood glucose. Label Reading Tips Read Nutrition Facts labels to find out how many grams of carbohydrate are in a food you want to eat. Don't forget: sometimes serving sizes on the label aren't the same as how much food you are going to eat, so you may need to calculate how much carbohydrate is in the food you are serving yourself.   Carbohydrate Counting for People with Diabetes Sample 1-Day Menu  Breakfast  cup yogurt, low fat, low sugar (1 carbohydrate serving)   cup cereal, ready-to-eat, unsweetened (1 carbohydrate serving)  1 cup strawberries (1 carbohydrate serving)   cup almonds ( carbohydrate  serving)  Lunch 1, 5 ounce can chunk light tuna  2 ounces cheese, low fat cheddar  6 whole wheat crackers (1 carbohydrate serving)  1 small apple (1 carbohydrate servings)   cup carrots ( carbohydrate serving)   cup snap peas  1 cup 1% milk (1 carbohydrate serving)   Evening Meal Stir fry made with: 3 ounces chicken  1 cup brown rice (3 carbohydrate servings)   cup broccoli ( carbohydrate serving)   cup green beans   cup onions  1 tablespoon olive oil  2 tablespoons teriyaki sauce ( carbohydrate serving)  Evening Snack 1 extra small banana (1 carbohydrate serving)  1 tablespoon peanut butter    Copyright 2020  Academy of Nutrition and Dietetics. All rights reserved.  Using Nutrition Labels: Carbohydrate  Serving Size  Look at the serving size. All the information on the label is based on this portion. Servings Per Container  The number of servings contained in the package. Guidelines for Carbohydrate  Look at the total grams of carbohydrate in the serving size.  1 carbohydrate choice =  15 grams of carbohydrate. Range of Carbohydrate Grams Per Choice  Carbohydrate Grams/Choice Carbohydrate Choices  6-10   11-20 1  21-25 1  26-35 2  36-40 2  41-50 3  51-55 3  56-65 4  66-70 4  71-80 5    Copyright 2020  Academy of Nutrition and Dietetics. All rights reserved.

## 2023-06-17 NOTE — Progress Notes (Addendum)
Initial Nutrition Assessment  DOCUMENTATION CODES:   Not applicable  INTERVENTION:  Liberalize diet to carb modified to offer more dining options Change to Glucerna Shake po BID, each supplement provides 220 kcal and 10 grams of protein  Attached education to AVS and placed outpatient nutrition education consult  NUTRITION DIAGNOSIS:   Inadequate oral intake related to decreased appetite as evidenced by per patient/family report.  GOAL:   Patient will meet greater than or equal to 90% of their needs  MONITOR:   PO intake, Supplement acceptance, Labs  REASON FOR ASSESSMENT:   Malnutrition Screening Tool    ASSESSMENT:   Pt with hx of DM, HTN, HLD, fibromyalgia, GERD, hx of MI, and CAD presented to ED with weakness and fatigue after a recent discharge from [facility] where she was found to have an AKI and UTI.  On admission, pt reported several days of poor oral intake and inadequate fluids. Found to have an AKI this admission.  Called pt on room phone to discuss nutrition hx. Pt feeling a little bit better than on admission and mean intake recorded at ~50%. Pt reports that prior to her first hospitalization she had lost a little weight but in the week in between her admission she lost an additional 10 lbs - likely a combination of dehydration and poor PO. UBW 170 but pt reports on admission to [facility] she was 156 lb. Discussed with pt quick weight gain in that short of a duration was not desired as it also lead to decreased muscle strength and longer recovery.     Pt reports drink glucerna drinks PTA, with adjust ensure order. Prefers chocolate.  In the course of conversation, pt did report some of her poor PO intake was related to fear and frustration over what she should be eating to manage her DM and other chronic health conditions. Pt had been attempting to make healthy changes but reports that her CBG remained very elevated. Had long discussion on importance of controlled  and consistent carbohydrate intake throughout the day. Provided examples of ways to balance meals/snacks and encouraged intake of high-fiber, whole grain complex carbohydrates.   RD provided "Carbohydrate Counting for People with Diabetes" handout from the Academy of Nutrition and Dietetics and AVS which discusses different food groups and their effects on blood sugar and list of carbohydrates and recommended serving sizes of common foods. Pt agreeable to outpatient education referral.  Average Meal Intake: 7/26: 50% intake x 1 recorded meals  Nutritionally Relevant Medications: Scheduled Meds:  atorvastatin  80 mg Daily   Ensure Enlive  237 mL BID BM   insulin aspart  0-15 Units TID WC   insulin aspart  0-5 Units QHS   insulin aspart protamine- aspart  15 Units BID WC   pantoprazole  40 mg Daily   Continuous Infusions:  sodium chloride 100 mL/hr at 06/17/23 0356   PRN Meds: ondansetron, polyethylene glycol  Labs Reviewed: K 5.5 Chloride 112 BUN 52, creatinine 1.47 CBG ranges from 167-204 mg/dL over the last 24 hours HgbA1c 9.8%  NUTRITION - FOCUSED PHYSICAL EXAM: Defer to in-person assessment  Diet Order:   Diet Order             Diet heart healthy/carb modified Fluid consistency: Thin  Diet effective now                   EDUCATION NEEDS:   Not appropriate for education at this time  Skin:  Skin Assessment: Reviewed RN Assessment  Last BM:  7/24  Height:  Ht Readings from Last 1 Encounters:  06/16/23 4\' 11"  (1.499 m)    Weight:  Wt Readings from Last 1 Encounters:  06/16/23 70.4 kg    Ideal Body Weight:  44.7 kg  BMI:  Body mass index is 31.35 kg/m.  Estimated Nutritional Needs:  Kcal:  1400-1700 kcal/d Protein:  75-90g/d Fluid:  1.5-1.8L/d    Greig Castilla, RD, LDN Clinical Dietitian RD pager # available in [facility]  After hours/weekend pager # available in [facility]

## 2023-06-18 DIAGNOSIS — N179 Acute kidney failure, unspecified: Secondary | ICD-10-CM | POA: Diagnosis not present

## 2023-06-18 DIAGNOSIS — E875 Hyperkalemia: Secondary | ICD-10-CM | POA: Diagnosis present

## 2023-06-18 LAB — GLUCOSE, CAPILLARY
Glucose-Capillary: 200 mg/dL — ABNORMAL HIGH (ref 70–99)
Glucose-Capillary: 193 mg/dL — ABNORMAL HIGH (ref 70–99)

## 2023-06-18 NOTE — Discharge Summary (Addendum)
Physician Discharge Summary   Patient: Amy Snow MRN: [id number] DOB: 13-Aug-1966  Admit date:     06/16/2023  Discharge date: 06/18/23  Discharge Physician: Lurene Shadow   PCP: Shelby Dubin, FNP   Recommendations at discharge:   Follow-up with PCP in 1 week to monitor kidney function, electrolytes and blood pressure  Discharge Diagnoses: Principal Problem:   AKI (acute kidney injury) (HCC) Active Problems:   Depression with anxiety   Diabetes mellitus (HCC)   Essential hypertension   Fibromyalgia   CAD in native artery   Hyperkalemia  Resolved Problems:   * No resolved hospital problems. [facility] Course:  Ms . Amy Snow is a 57 year old female with medical history significant for diabetes mellitus, hypertension, hyperlipidemia, NSTEMI in 2016 s/p drug-eluting stent, ischemic cardiomyopathy, fibromyalgia, anxiety, depression.  She was recently admitted at [facility] from 06/06/2023 through 06/09/2023 for AKI and hyperkalemia.  She presented to the hospital with general weakness, and poor oral intake.  She said she had been feeling weak since she was discharged from [facility].   She was found to have AKI and hyperkalemia with creatinine of 2.49 and potassium of 5.3 respectively.  Potassium eventually went up to 5.5. She was on HCTZ and lisinopril prior to admission.  She was treated with IV fluids and Lokelma.  AKI and hyperkalemia have resolved.  Given recurrent hyperkalemia and AKI, lisinopril will be discontinued at discharge.  She will continue HCTZ for hypertension.  Close follow-up with PCP was recommended for further management of her hypertension and also to monitor her kidney function and electrolytes..        Consultants: None Procedures performed: None  Disposition: Home Diet recommendation:  Discharge Diet Orders (From admission, onward)     Start     Ordered   06/18/23 0000  Diet - low sodium heart healthy        06/18/23 0905            Cardiac diet DISCHARGE MEDICATION: Allergies as of 06/18/2023   No Known Allergies      Medication List     STOP taking these medications    lisinopril 20 MG tablet Commonly known as: ZESTRIL       TAKE these medications    acetaminophen 650 MG CR tablet Commonly known as: TYLENOL Take 650-1,300 mg by mouth every 8 (eight) hours as needed for pain.   albuterol 108 (90 Base) MCG/ACT inhaler Commonly known as: VENTOLIN HFA Inhale 2 puffs into the lungs every 4 (four) hours as needed for wheezing or shortness of breath (cough).   amitriptyline 50 MG tablet Commonly known as: ELAVIL Take 50 mg by mouth at bedtime.   aspirin 81 MG chewable tablet Chew 1 tablet (81 mg total) by mouth daily.   atorvastatin 80 MG tablet Commonly known as: LIPITOR Take 1 tablet (80 mg total) by mouth daily.   Basaglar KwikPen 100 UNIT/ML Inject 20 Units into the skin at bedtime.   carvedilol 25 MG tablet Commonly known as: COREG Take 1 tablet (25 mg total) by mouth 2 (two) times daily.   clopidogrel 75 MG tablet Commonly known as: PLAVIX Take 1 tablet (75 mg total) by mouth daily. Please schedule appointment for further refills   cyclobenzaprine 10 MG tablet Commonly known as: FLEXERIL TAKE 1 TABLET BY MOUTH TWICE DAILY AS NEEDED FOR MUSCLE SPASM   FeroSul 325 (65 Fe) MG tablet Generic drug: ferrous sulfate Take 325 mg by mouth 2 (  two) times daily.   gemfibrozil 600 MG tablet Commonly known as: LOPID Take 600 mg by mouth 2 (two) times daily before a meal.   hydrochlorothiazide 12.5 MG capsule Commonly known as: MICROZIDE TAKE 1 CAPSULE BY MOUTH ONCE DAILY-TAKE AN EXTRA CAPSULE DAILY AS NEEDED FOR WEIGHT GAIN OF 3 LBS IN ONE DAY OR 5 LBS IN ONE WEEK   isosorbide mononitrate 60 MG 24 hr tablet Commonly known as: IMDUR Take 1 tablet (60 mg total) by mouth daily.   Jardiance 10 MG Tabs tablet Generic drug: empagliflozin Take 10 mg by mouth daily.    meclizine 12.5 MG tablet Commonly known as: ANTIVERT Take 12.5 mg by mouth every 6 (six) hours as needed for nausea or dizziness.   metFORMIN 500 MG tablet Commonly known as: GLUCOPHAGE Take 500 mg by mouth 2 (two) times daily.   nitroGLYCERIN 0.4 MG SL tablet Commonly known as: NITROSTAT Place 1 tablet (0.4 mg total) under the tongue every 5 (five) minutes x 3 doses as needed for chest pain.   NovoLOG FlexPen 100 UNIT/ML FlexPen Generic drug: insulin aspart Inject 1-7 Units into the skin 3 (three) times daily.   pantoprazole 40 MG tablet Commonly known as: PROTONIX Take 1 tablet (40 mg total) by mouth daily.   sertraline 100 MG tablet Commonly known as: ZOLOFT Take 150 mg by mouth at bedtime.   topiramate 50 MG tablet Commonly known as: TOPAMAX Take 50 mg by mouth 2 (two) times daily.   traMADol 50 MG tablet Commonly known as: ULTRAM TAKE 1 TABLET BY MOUTH EVERY 12 HOURS AS NEEDED   traZODone 100 MG tablet Commonly known as: DESYREL Take 100 mg by mouth at bedtime.   Vitamin D (Ergocalciferol) 1.25 MG (50000 UNIT) Caps capsule Commonly known as: DRISDOL Take 50,000 Units by mouth once a week.        Discharge Exam: Filed Weights   06/16/23 1153 06/16/23 1710  Weight: 72.6 kg 70.4 kg   GEN: NAD SKIN: Warm and dry EYES: No pallor or icterus ENT: MMM CV: RRR PULM: CTA B ABD: soft, obese, NT, +BS CNS: AAO x 3, non focal EXT: No edema or tenderness   Condition at discharge: good  The results of significant diagnostics from this hospitalization (including imaging, microbiology, ancillary and laboratory) are listed below for reference.   Imaging Studies: No results found.  Microbiology: Results for orders placed or performed during the hospital encounter of 06/16/23  SARS Coronavirus 2 by RT PCR (hospital order, performed in [facility] hospital lab) *cepheid single result test* Urine, Clean Catch     Status: None   Collection Time: 06/16/23  1:11 PM    Specimen: Urine, Clean Catch; Nasal Swab  Result Value Ref Range Status   SARS Coronavirus 2 by RT PCR NEGATIVE NEGATIVE Final    Comment: (NOTE) SARS-CoV-2 target nucleic acids are NOT DETECTED.  The SARS-CoV-2 RNA is generally detectable in upper and lower respiratory specimens during the acute phase of infection. The lowest concentration of SARS-CoV-2 viral copies this assay can detect is 250 copies / mL. A negative result does not preclude SARS-CoV-2 infection and should not be used as the sole basis for treatment or other patient management decisions.  A negative result may occur with improper specimen collection / handling, submission of specimen other than nasopharyngeal swab, presence of viral mutation(s) within the areas targeted by this assay, and inadequate number of viral copies (<250 copies / mL). A negative result must be combined with  clinical observations, patient history, and epidemiological information.  Fact Sheet for Patients:   RoadLapTop.co.za  Fact Sheet for Healthcare Providers: http://kim-miller.com/  This test is not yet approved or  cleared by the [place] FDA and has been authorized for detection and/or diagnosis of SARS-CoV-2 by FDA under an Emergency Use Authorization (EUA).  This EUA will remain in effect (meaning this test can be used) for the duration of the COVID-19 declaration under Section 564(b)(1) of the Act, 21 U.S.C. section 360bbb-3(b)(1), unless the authorization is terminated or revoked sooner.  Performed at [facility], [address], [place], [place] [address]     Labs: CBC: Recent Labs  Lab 06/16/23 1229 06/18/23 0438  WBC 4.4 4.2  HGB 10.7* 9.7*  HCT 34.3* 32.1*  MCV 83.3 84.0  PLT 230 177   Basic Metabolic Panel: Recent Labs  Lab 06/16/23 1229 06/17/23 0413 06/17/23 0901 06/18/23 0438  NA 134* 138  --  139  K 5.3* 5.2* 5.5* 5.1  CL 105 112*  --  111  CO2 18*  18*  --  22  GLUCOSE 225* 193*  --  144*  BUN 64* 52*  --  36*  CREATININE 2.49* 1.47*  --  1.04*  CALCIUM 8.9 8.6*  --  9.3  MG 2.2  --   --  1.9  PHOS  --   --   --  2.8   Liver Function Tests: No results for input(s): "AST", "ALT", "ALKPHOS", "BILITOT", "PROT", "ALBUMIN" in the last 168 hours. CBG: Recent Labs  Lab 06/17/23 1126 06/17/23 1629 06/17/23 2156 06/18/23 0720 06/18/23 1151  GLUCAP 238* 142* 106* 200* 193*    Discharge time spent: greater than 30 minutes.  Signed: Lurene Shadow, MD [facility] 06/18/2023

## 2023-06-18 NOTE — Plan of Care (Signed)

## 2023-06-18 NOTE — Progress Notes (Signed)
Patient discharged with instructions given on medications and follow up visits verbalized understanding .IV discontinued catheter intact.Staff to accompany patient to awaiting vehicle.

## 2023-06-21 DIAGNOSIS — E669 Obesity, unspecified: Secondary | ICD-10-CM | POA: Diagnosis not present

## 2023-06-21 DIAGNOSIS — G4700 Insomnia, unspecified: Secondary | ICD-10-CM | POA: Diagnosis not present

## 2023-06-21 DIAGNOSIS — E785 Hyperlipidemia, unspecified: Secondary | ICD-10-CM | POA: Diagnosis not present

## 2023-06-21 DIAGNOSIS — I10 Essential (primary) hypertension: Secondary | ICD-10-CM | POA: Diagnosis not present

## 2023-06-21 DIAGNOSIS — I2510 Atherosclerotic heart disease of native coronary artery without angina pectoris: Secondary | ICD-10-CM | POA: Diagnosis not present

## 2023-06-21 DIAGNOSIS — E559 Vitamin D deficiency, unspecified: Secondary | ICD-10-CM | POA: Diagnosis not present

## 2023-06-21 DIAGNOSIS — E1165 Type 2 diabetes mellitus with hyperglycemia: Secondary | ICD-10-CM | POA: Diagnosis not present

## 2023-06-21 DIAGNOSIS — Z713 Dietary counseling and surveillance: Secondary | ICD-10-CM | POA: Diagnosis not present

## 2023-06-21 DIAGNOSIS — Z7182 Exercise counseling: Secondary | ICD-10-CM | POA: Diagnosis not present

## 2023-07-19 ENCOUNTER — Other Ambulatory Visit: Payer: Self-pay | Admitting: Orthopedic Surgery

## 2023-07-20 DIAGNOSIS — I129 Hypertensive chronic kidney disease with stage 1 through stage 4 chronic kidney disease, or unspecified chronic kidney disease: Secondary | ICD-10-CM | POA: Diagnosis not present

## 2023-07-20 DIAGNOSIS — E1122 Type 2 diabetes mellitus with diabetic chronic kidney disease: Secondary | ICD-10-CM | POA: Diagnosis not present

## 2023-07-20 DIAGNOSIS — N179 Acute kidney failure, unspecified: Secondary | ICD-10-CM | POA: Diagnosis not present

## 2023-07-20 DIAGNOSIS — N1831 Chronic kidney disease, stage 3a: Secondary | ICD-10-CM | POA: Diagnosis not present

## 2023-07-27 ENCOUNTER — Other Ambulatory Visit (HOSPITAL_COMMUNITY): Payer: Self-pay | Admitting: Nephrology

## 2023-07-27 DIAGNOSIS — N289 Disorder of kidney and ureter, unspecified: Secondary | ICD-10-CM

## 2023-08-08 ENCOUNTER — Ambulatory Visit (HOSPITAL_COMMUNITY)
Admission: RE | Admit: 2023-08-08 | Discharge: 2023-08-08 | Disposition: A | Discharge status: Home / Self Care | Payer: 59 | Source: Ambulatory Visit | Attending: Nephrology | Admitting: Nephrology

## 2023-08-08 DIAGNOSIS — N289 Disorder of kidney and ureter, unspecified: Secondary | ICD-10-CM | POA: Diagnosis not present

## 2023-08-09 ENCOUNTER — Encounter: Payer: Self-pay | Admitting: Orthopedic Surgery

## 2023-08-09 ENCOUNTER — Ambulatory Visit: Payer: 59 | Admitting: Orthopedic Surgery

## 2023-08-09 VITALS — BP 146/81 | HR 62 | Ht 59.0 in | Wt 155.0 lb

## 2023-08-09 DIAGNOSIS — M25512 Pain in left shoulder: Secondary | ICD-10-CM

## 2023-08-09 DIAGNOSIS — G8929 Other chronic pain: Secondary | ICD-10-CM

## 2023-08-09 MED ORDER — CYCLOBENZAPRINE HCL 10 MG PO TABS: 10.0000 mg | ORAL_TABLET | Freq: Two times a day (BID) | ORAL | 0 refills | Status: DC | PRN

## 2023-08-09 MED ORDER — TRAMADOL HCL 50 MG PO TABS: 50.0000 mg | ORAL_TABLET | Freq: Two times a day (BID) | ORAL | 0 refills | Status: DC | PRN

## 2023-08-09 NOTE — Patient Instructions (Addendum)
Instructions Following Joint Injections  In clinic today, you received an injection in one of your joints (sometimes more than one).  Occasionally, you can have some pain at the injection site, this is normal.  You can place ice at the injection site, or take over-the-counter medications such as Tylenol (acetaminophen) or Advil (ibuprofen).  Please follow all directions listed on the bottle.  If your joint (knee or shoulder) becomes swollen, red or very painful, please contact the clinic for additional assistance.   Two medications were injected, including lidocaine and a steroid (often referred to as cortisone).  Lidocaine is effective almost immediately but wears off quickly.  However, the steroid can take a few days to improve your symptoms.  In some cases, it can make your pain worse for a couple of days.  Do not be concerned if this happens as it is common.  You can apply ice or take some over-the-counter medications as needed.   Injections in the same joint cannot be repeated for 3 months.  This helps to limit the risk of an infection in the joint.  If you were to develop an infection in your joint, the best treatment option would be surgery.  While we are working on your approval for MRI please go ahead and call to schedule your appointment with [facility] within at least one (1) week.   [phone]

## 2023-08-09 NOTE — Progress Notes (Signed)
Return patient Visit  Assessment: Amy Snow is a 57 y.o. female with the following: 1. Chronic pain of left shoulder 2.  Right shoulder rotator cuff tear; minimal symptoms  Plan: Amy Snow continues to have pain in the left shoulder.  She has limited range of motion.  She has difficulty getting her hand above the level of her shoulder.  Positive drop arm test.  Positive empty can test.  Positive Jobe's.  Decreased strength in the supraspinatus.  She has tried medications, activity modifications, physical therapy and an injection.  As a result, I am recommending an MRI of the left shoulder.  Once the results are available, she will return to clinic to discuss the findings.  In the meantime, I have offered her injection for the left shoulder, and she is elected to proceed.   Procedure note injection Left shoulder    Verbal consent was obtained to inject the left shoulder, subacromial space Timeout was completed to confirm the site of injection.  The skin was prepped with alcohol and ethyl chloride was sprayed at the injection site.  A 21-gauge needle was used to inject 40 mg of Depo-Medrol and 1% lidocaine (4 cc) into the subacromial space of the left shoulder using a posterolateral approach.  There were no complications. A sterile bandage was applied.   Follow-up: Return for mri review shoulder .  Subjective:  Chief Complaint  Patient presents with   Shoulder Pain    Left worse than right / her insurance did not previously approve the MRI of the left shoulder, has gone for the physical therapy did not help any and would like to try again for the MRI     History of Present Illness: Amy Snow is a 57 y.o. female who returns for evaluation of left shoulder pain.  Her left shoulder is bothering her more than her right shoulder.  She reports improved function of the right shoulder.  Limited function of the left arm, due to pain.  She has some weakness.  She continues  to take medications.  She has worked with physical therapy, with limited improvement.  She is interested in another injection today.   Review of Systems: No fevers or chills No numbness or tingling No chest pain No shortness of breath No bowel or bladder dysfunction No GI distress No headaches  Objective: BP (!) 146/81   Pulse 62   Ht 4\' 11"  (1.499 m)   Wt 155 lb (70.3 kg)   LMP 12/05/2015   BMI 31.31 kg/m   Physical Exam:  General: Alert and oriented. and No acute distress. Gait: Normal gait.  Left shoulder without deformity.  Forward flexion is limited to 100 degrees.  4+/5 strength.  Positive Jobe's.  Negative belly press.  Fingers are warm and well-perfused.  Positive drop arm test.    IMAGING: I personally ordered and reviewed the following images  No new imaging obtained today.   New Medications:  Meds ordered this encounter  Medications   traMADol (ULTRAM) 50 MG tablet    Sig: Take 1 tablet (50 mg total) by mouth every 12 (twelve) hours as needed.    Dispense:  20 tablet    Refill:  0   cyclobenzaprine (FLEXERIL) 10 MG tablet    Sig: Take 1 tablet (10 mg total) by mouth 2 (two) times daily as needed for muscle spasms.    Dispense:  20 tablet    Refill:  0      Mordecai Tindol A  Dallas Schimke, MD  08/09/2023 12:52 PM

## 2023-08-22 DIAGNOSIS — M797 Fibromyalgia: Secondary | ICD-10-CM | POA: Diagnosis not present

## 2023-08-22 DIAGNOSIS — Z713 Dietary counseling and surveillance: Secondary | ICD-10-CM | POA: Diagnosis not present

## 2023-08-22 DIAGNOSIS — I10 Essential (primary) hypertension: Secondary | ICD-10-CM | POA: Diagnosis not present

## 2023-08-22 DIAGNOSIS — E1165 Type 2 diabetes mellitus with hyperglycemia: Secondary | ICD-10-CM | POA: Diagnosis not present

## 2023-08-22 DIAGNOSIS — G4700 Insomnia, unspecified: Secondary | ICD-10-CM | POA: Diagnosis not present

## 2023-08-22 DIAGNOSIS — Z124 Encounter for screening for malignant neoplasm of cervix: Secondary | ICD-10-CM | POA: Diagnosis not present

## 2023-08-22 DIAGNOSIS — E669 Obesity, unspecified: Secondary | ICD-10-CM | POA: Diagnosis not present

## 2023-08-22 DIAGNOSIS — Z7182 Exercise counseling: Secondary | ICD-10-CM | POA: Diagnosis not present

## 2023-08-22 DIAGNOSIS — Z0000 Encounter for general adult medical examination without abnormal findings: Secondary | ICD-10-CM | POA: Diagnosis not present

## 2023-08-22 DIAGNOSIS — I2510 Atherosclerotic heart disease of native coronary artery without angina pectoris: Secondary | ICD-10-CM | POA: Diagnosis not present

## 2023-08-22 DIAGNOSIS — E785 Hyperlipidemia, unspecified: Secondary | ICD-10-CM | POA: Diagnosis not present

## 2023-08-22 DIAGNOSIS — E559 Vitamin D deficiency, unspecified: Secondary | ICD-10-CM | POA: Diagnosis not present

## 2023-08-26 ENCOUNTER — Encounter: Payer: Self-pay | Admitting: Orthopedic Surgery

## 2023-08-29 ENCOUNTER — Telehealth: Payer: Self-pay | Admitting: Orthopedic Surgery

## 2023-08-29 ENCOUNTER — Encounter: Payer: Self-pay | Admitting: Orthopedic Surgery

## 2023-08-29 NOTE — Telephone Encounter (Signed)
Dr. Dallas Schimke pt Amy Snow w/[facility] [phone] ext [phone] lvm stating insurance denied this patient's MRI left shoulder w/out contrast, wants to know if we are going to appeal or do a peer to peer to try and get this overturned.

## 2023-08-30 ENCOUNTER — Other Ambulatory Visit (HOSPITAL_COMMUNITY): Payer: Self-pay | Admitting: Family Medicine

## 2023-08-30 ENCOUNTER — Encounter: Payer: Self-pay | Admitting: Orthopedic Surgery

## 2023-08-30 ENCOUNTER — Other Ambulatory Visit: Payer: Self-pay

## 2023-08-30 DIAGNOSIS — G8929 Other chronic pain: Secondary | ICD-10-CM

## 2023-08-30 DIAGNOSIS — Z1231 Encounter for screening mammogram for malignant neoplasm of breast: Secondary | ICD-10-CM

## 2023-08-31 ENCOUNTER — Encounter: Payer: Self-pay | Admitting: Orthopedic Surgery

## 2023-09-01 ENCOUNTER — Other Ambulatory Visit: Payer: 59

## 2023-09-12 ENCOUNTER — Encounter (HOSPITAL_COMMUNITY): Payer: Self-pay

## 2023-09-12 ENCOUNTER — Ambulatory Visit (HOSPITAL_COMMUNITY)
Admission: RE | Admit: 2023-09-12 | Discharge: 2023-09-12 | Disposition: A | Discharge status: Home / Self Care | Payer: 59 | Source: Ambulatory Visit | Attending: Family Medicine | Admitting: Family Medicine

## 2023-09-12 DIAGNOSIS — E1122 Type 2 diabetes mellitus with diabetic chronic kidney disease: Secondary | ICD-10-CM | POA: Diagnosis not present

## 2023-09-12 DIAGNOSIS — I5032 Chronic diastolic (congestive) heart failure: Secondary | ICD-10-CM | POA: Diagnosis not present

## 2023-09-12 DIAGNOSIS — Z1231 Encounter for screening mammogram for malignant neoplasm of breast: Secondary | ICD-10-CM | POA: Insufficient documentation

## 2023-09-12 DIAGNOSIS — E875 Hyperkalemia: Secondary | ICD-10-CM | POA: Diagnosis not present

## 2023-09-12 DIAGNOSIS — N179 Acute kidney failure, unspecified: Secondary | ICD-10-CM | POA: Diagnosis not present

## 2023-09-12 DIAGNOSIS — I2510 Atherosclerotic heart disease of native coronary artery without angina pectoris: Secondary | ICD-10-CM | POA: Diagnosis not present

## 2023-09-12 DIAGNOSIS — N1831 Chronic kidney disease, stage 3a: Secondary | ICD-10-CM | POA: Diagnosis not present

## 2023-09-12 DIAGNOSIS — I129 Hypertensive chronic kidney disease with stage 1 through stage 4 chronic kidney disease, or unspecified chronic kidney disease: Secondary | ICD-10-CM | POA: Diagnosis not present

## 2023-09-12 DIAGNOSIS — N189 Chronic kidney disease, unspecified: Secondary | ICD-10-CM | POA: Diagnosis not present

## 2023-09-13 ENCOUNTER — Ambulatory Visit: Payer: 59 | Admitting: Orthopedic Surgery

## 2023-09-14 ENCOUNTER — Other Ambulatory Visit: Payer: Self-pay | Admitting: Orthopedic Surgery

## 2023-09-23 DIAGNOSIS — E8721 Acute metabolic acidosis: Secondary | ICD-10-CM | POA: Diagnosis not present

## 2023-10-07 DIAGNOSIS — N1831 Chronic kidney disease, stage 3a: Secondary | ICD-10-CM | POA: Diagnosis not present

## 2023-10-11 ENCOUNTER — Telehealth: Payer: Self-pay | Admitting: Internal Medicine

## 2023-10-11 NOTE — Telephone Encounter (Signed)
I spoke to the patient and explained to her that she will be seeing Dr. Renold Don for ESBL positive in  her urine. Dr. Renold Don will be going over everything during her appointment. Patient verbalized understanding. Eran Windish T Pricilla Loveless

## 2023-10-11 NOTE — Telephone Encounter (Signed)
Amy Snow called hoping to speak to a nurse regarding the consultation with the doctor. She seems apprehensive to see an Infectious Disease doctor and has concerns. Patient can be reached at [phone].

## 2023-10-25 ENCOUNTER — Ambulatory Visit (INDEPENDENT_AMBULATORY_CARE_PROVIDER_SITE_OTHER): Payer: 59 | Admitting: Internal Medicine

## 2023-10-25 ENCOUNTER — Encounter: Payer: Self-pay | Admitting: Internal Medicine

## 2023-10-25 ENCOUNTER — Other Ambulatory Visit: Payer: Self-pay

## 2023-10-25 VITALS — BP 113/75 | HR 72 | Resp 16 | Ht 59.0 in | Wt 147.0 lb

## 2023-10-25 DIAGNOSIS — R899 Unspecified abnormal finding in specimens from other organs, systems and tissues: Secondary | ICD-10-CM

## 2023-10-25 NOTE — Patient Instructions (Signed)
I can't find history of esbl on chart provided or our system  I do not hear any history to suggest recurrent uti  No indication for antibiotics   If any other concern regarding this, I am happy to see you again

## 2023-10-25 NOTE — Progress Notes (Signed)
[facility]  Reason for Consult:esbl organism colonization Referring Provider: Celso Amy, MD ([facility] [phone]    Patient Active Problem List   Diagnosis Date Noted   Hyperkalemia 06/18/2023   AKI (acute kidney injury) (HCC) 06/16/2023   Sepsis (HCC) 07/24/2022   CAP (community acquired pneumonia) 07/24/2022   Elevated troponin    Chest pain 05/04/2021   Dysphagia 08/11/2020   Chronic diarrhea 08/11/2020   Screening for colorectal cancer 08/11/2020   GERD (gastroesophageal reflux disease) 08/11/2020   Unstable angina (HCC) 05/25/2019   CAD in native artery 05/24/2019   Diabetes mellitus (HCC) 07/06/2015   Acute heart failure (HCC) 07/06/2015   Essential hypertension 07/06/2015   Hyperlipidemia 07/06/2015   Fibromyalgia 07/06/2015   Acute ST elevation myocardial infarction (STEMI) involving other coronary artery of anterior wall (HCC) 07/05/2015   ST elevation myocardial infarction (STEMI) of inferior wall (HCC)    Depression with anxiety 10/10/2013      HPI: Amy Snow is a 57 y.o. female ischemic cardiomyopathy, dm2, ckd, htn referred here from kidney clinic for "esbl"  I reviewed chart from 09/23/23 sent from the kidney clinic and reviewed care everywhere as well as recent 05/2023 admission for aki  There is no culture blood or urine that is with esbl She has a unc 05/2023 ucx that showed mixed flora  Her last uti was years ago. She never frequent uti or recurrent uti sx  She does take jardiance for diabetes mellitus  Review of Systems: ROS All other ros negative      Past Medical History:  Diagnosis Date   Anxiety and depression    CAD (coronary artery disease)    a. s/p STEMI 2016 s/p DES x 2 PDA with residual dz treated medically. b. [place] in 2020 with cath s/p DES to long high grade lesion in LAD, residual disease treated medically, EF 50%.   Diabetes mellitus, type II (HCC)    Family  history of adverse reaction to anesthesia    PONV   Fibromyalgia    GERD (gastroesophageal reflux disease)    Hyperlipidemia    Hypertension    Ischemic cardiomyopathy    a. EF <25% in 2016, improved to normal thereafter. b. EF 50% in 05/2019.   Myocardial infarct [facility])    august 2016    Social History   Tobacco Use   Smoking status: Never    Passive exposure: Never   Smokeless tobacco: Never  Vaping Use   Vaping status: Never Used  Substance Use Topics   Alcohol use: Yes    Comment: occasionally   Drug use: No    Family History  Problem Relation Age of Onset   Alcohol abuse Mother    Anxiety disorder Father    Depression Father    Alcohol abuse Father    Anxiety disorder Sister    Depression Sister    Depression Maternal Aunt    Breast cancer Maternal Grandmother    Breast cancer Paternal Grandmother    Depression Cousin     No Known Allergies  OBJECTIVE: Vitals:   10/25/23 0958  BP: 113/75  Pulse: 72  Resp: 16  Weight: 147 lb (66.7 kg)  Height: 4\' 11"  (1.499 m)   Body mass index is 29.69 kg/m.   Physical Exam General/constitutional: no distress, pleasant HEENT: Normocephalic, PER, Conj Clear, EOMI, Oropharynx clear Neck supple CV: rrr no mrg Lungs: clear to auscultation, normal respiratory effort  Abd: Soft, Nontender Ext: no edema Skin: No Rash Neuro: nonfocal MSK: no peripheral joint swelling/tenderness/warmth; back spines nontender   Lab: Lab Results  Component Value Date   WBC 4.2 06/18/2023   HGB 9.7 (L) 06/18/2023   HCT 32.1 (L) 06/18/2023   MCV 84.0 06/18/2023   PLT 177 06/18/2023   Last metabolic panel Lab Results  Component Value Date   GLUCOSE 144 (H) 06/18/2023   NA 139 06/18/2023   K 5.1 06/18/2023   CL 111 06/18/2023   CO2 22 06/18/2023   BUN 36 (H) 06/18/2023   CREATININE 1.04 (H) 06/18/2023   GFRNONAA >60 06/18/2023   CALCIUM 9.3 06/18/2023   PHOS 2.8 06/18/2023   PROT 6.9 02/26/2023   ALBUMIN 3.5 02/26/2023    BILITOT 0.5 02/26/2023   ALKPHOS 139 (H) 02/26/2023   AST 15 02/26/2023   ALT 8 02/26/2023   ANIONGAP 6 06/18/2023    Microbiology:  Serology:  Imaging:   Assessment/plan: Problem List Items Addressed This Visit   None Visit Diagnoses     Abnormal laboratory test    -  Primary         Patient was referred here by her kidney clinic for "esbl" ?colonization. I am unable to find this anywhere on outside chart or care every where or epic  She doesn't have any issue with LUTS or recurrent uti  Even if she is colonized, without recurrent uti no indicaiton for any antibiotics prophylaxis at this time     I spent more than 80 minute reviewing data/chart, and coordinating care, providing direct face to face time providing counseling/discussing diagnostics/treatment plan with patient and treatment team    Follow-up: No follow-ups on file.     Raymondo Band, MD [facility] 10/25/2023, 10:14 AM

## 2023-10-31 ENCOUNTER — Other Ambulatory Visit: Payer: Self-pay | Admitting: Orthopedic Surgery

## 2023-11-01 ENCOUNTER — Ambulatory Visit: Payer: 59 | Admitting: Nurse Practitioner

## 2023-11-01 ENCOUNTER — Encounter: Payer: Self-pay | Admitting: Nurse Practitioner

## 2023-11-11 ENCOUNTER — Encounter: Payer: Self-pay | Admitting: Orthopedic Surgery

## 2023-11-11 ENCOUNTER — Ambulatory Visit: Payer: 59 | Admitting: Orthopedic Surgery

## 2023-11-11 VITALS — BP 137/85 | HR 73 | Ht 59.0 in | Wt 147.0 lb

## 2023-11-11 DIAGNOSIS — G8929 Other chronic pain: Secondary | ICD-10-CM | POA: Diagnosis not present

## 2023-11-11 DIAGNOSIS — M25512 Pain in left shoulder: Secondary | ICD-10-CM | POA: Diagnosis not present

## 2023-11-11 MED ORDER — TRAMADOL HCL 50 MG PO TABS: 50.0000 mg | ORAL_TABLET | Freq: Two times a day (BID) | ORAL | 0 refills | Status: DC | PRN

## 2023-11-11 MED ORDER — CYCLOBENZAPRINE HCL 10 MG PO TABS: 10.0000 mg | ORAL_TABLET | Freq: Two times a day (BID) | ORAL | 0 refills | Status: DC

## 2023-11-11 NOTE — Patient Instructions (Signed)

## 2023-11-11 NOTE — Progress Notes (Signed)
  Return patient Visit  Assessment: Amy Snow is a 57 y.o. female with the following: 1. Chronic pain of left shoulder 2.  Right shoulder rotator cuff tear; minimal symptoms  Plan: Amy Snow continues to have pain in the left shoulder.  We tried to obtain an MRI, but she has not completed physical therapy recently.  This was discussed with the patient in clinic today.  We will refer her for physical therapy.  I provided her with updated prescriptions for medications.  Left shoulder was injected in clinic today.  I would like see her back in 2 months.  If she is having issues before then, or her shoulder gets worse with therapy, we can potentially obtain an MRI sooner.   Procedure note injection Left shoulder    Verbal consent was obtained to inject the left shoulder, subacromial space Timeout was completed to confirm the site of injection.  The skin was prepped with alcohol and ethyl chloride was sprayed at the injection site.  A 21-gauge needle was used to inject 40 mg of Depo-Medrol and 1% lidocaine (4 cc) into the subacromial space of the left shoulder using a posterolateral approach.  There were no complications. A sterile bandage was applied.   Follow-up: Return in about 2 months (around 01/12/2024).  Subjective:  Chief Complaint  Patient presents with   Shoulder Pain    L shoulder pt states she was walking a dog and he ran off jerking her L arm 1 mo again and the pain has been getting progressively worse for the past 2 wks.     History of Present Illness: Amy Snow is a 57 y.o. female who returns for evaluation of left shoulder pain.  She continues to have issues with the left shoulder.  Last visit, we injected left shoulder.  She notes some improvement in her symptoms.  In addition, we tried to obtain an MRI.  Unfortunately, her MRI was not authorized, as she had not completed therapy recently enough.  Recently, her dog irritated her shoulder, by taking off  while she was holding her collar.  Otherwise, no recent injuries.  Review of Systems: No fevers or chills No numbness or tingling No chest pain No shortness of breath No bowel or bladder dysfunction No GI distress No headaches  Objective: BP 137/85   Pulse 73   Ht 4\' 11"  (1.499 m)   Wt 147 lb (66.7 kg)   LMP 12/05/2015   BMI 29.69 kg/m   Physical Exam:  General: Alert and oriented. and No acute distress. Gait: Normal gait.  Left shoulder without deformity.  Forward flexion is limited to 100 degrees.  4+/5 strength.  Positive Jobe's.  Negative belly press.  Fingers are warm and well-perfused.  Positive drop arm test.    IMAGING: I personally ordered and reviewed the following images  No new imaging obtained today.   New Medications:  Meds ordered this encounter  Medications   cyclobenzaprine (FLEXERIL) 10 MG tablet    Sig: Take 1 tablet (10 mg total) by mouth 2 (two) times daily.    Dispense:  30 tablet    Refill:  0   traMADol (ULTRAM) 50 MG tablet    Sig: Take 1 tablet (50 mg total) by mouth every 12 (twelve) hours as needed.    Dispense:  20 tablet    Refill:  0      Oliver Barre, MD  11/11/2023 10:08 AM

## 2024-01-13 ENCOUNTER — Ambulatory Visit: Payer: 59 | Admitting: Orthopedic Surgery

## 2024-02-22 ENCOUNTER — Other Ambulatory Visit: Payer: Self-pay | Admitting: Orthopedic Surgery

## 2024-03-30 ENCOUNTER — Other Ambulatory Visit: Payer: Self-pay | Admitting: Orthopedic Surgery

## 2024-04-20 ENCOUNTER — Other Ambulatory Visit: Payer: Self-pay

## 2024-04-20 MED ORDER — TRAMADOL HCL 50 MG PO TABS: 50.0000 mg | ORAL_TABLET | Freq: Two times a day (BID) | ORAL | 0 refills | Status: DC | PRN

## 2024-05-21 ENCOUNTER — Other Ambulatory Visit: Payer: Self-pay | Admitting: Orthopedic Surgery

## 2024-05-22 ENCOUNTER — Other Ambulatory Visit: Payer: Self-pay | Admitting: Nurse Practitioner

## 2024-05-29 ENCOUNTER — Other Ambulatory Visit: Payer: Self-pay

## 2024-05-29 MED ORDER — PANTOPRAZOLE SODIUM 40 MG PO TBEC: 40.0000 mg | DELAYED_RELEASE_TABLET | Freq: Every day | ORAL | 1 refills | Status: DC

## 2024-05-29 MED ORDER — ATORVASTATIN CALCIUM 80 MG PO TABS: 80.0000 mg | ORAL_TABLET | Freq: Every day | ORAL | 0 refills | Status: AC

## 2024-06-19 ENCOUNTER — Ambulatory Visit (INDEPENDENT_AMBULATORY_CARE_PROVIDER_SITE_OTHER): Admitting: Orthopedic Surgery

## 2024-06-19 ENCOUNTER — Encounter: Payer: Self-pay | Admitting: Orthopedic Surgery

## 2024-06-19 VITALS — BP 107/70 | HR 83 | Ht 59.0 in | Wt 147.0 lb

## 2024-06-19 DIAGNOSIS — G8929 Other chronic pain: Secondary | ICD-10-CM

## 2024-06-19 DIAGNOSIS — M75111 Incomplete rotator cuff tear or rupture of right shoulder, not specified as traumatic: Secondary | ICD-10-CM | POA: Diagnosis not present

## 2024-06-19 DIAGNOSIS — M25512 Pain in left shoulder: Secondary | ICD-10-CM

## 2024-06-19 MED ORDER — METHYLPREDNISOLONE ACETATE 40 MG/ML IJ SUSP
40.0000 mg | Freq: Once | INTRAMUSCULAR | Status: AC
Start: 2024-06-19 — End: 2024-06-19
  Administered 2024-06-19: 40 mg via INTRA_ARTICULAR

## 2024-06-19 NOTE — Addendum Note (Signed)
 Addended byBETHA JENEAN GREIG LELON on: 06/19/2024 02:07 PM   Modules accepted: Orders

## 2024-06-19 NOTE — Progress Notes (Signed)
  Return patient Visit  Assessment: Amy Snow is a 58 y.o. female with the following: 1. Chronic pain of left shoulder 2.  Right shoulder rotator cuff tear; minimal symptoms  Plan: ARMIE MOREN continues to have pain in the left shoulder.  She is also having issues with the right shoulder.  She is interested in injections.  These were completed in clinic today.  There was some miscommunication regarding therapy the last time she was here, so we will place another referral.  Procedure note injection Left shoulder    Verbal consent was obtained to inject the left shoulder, subacromial space Timeout was completed to confirm the site of injection.  The skin was prepped with alcohol and ethyl chloride was sprayed at the injection site.  A 21-gauge needle was used to inject 40 mg of Depo-Medrol  and 1% lidocaine  (4 cc) into the subacromial space of the left shoulder using a posterolateral approach.  There were no complications. A sterile bandage was applied.   Procedure note injection - Right shoulder    Verbal consent was obtained to inject the right shoulder, subacromial space Timeout was completed to confirm the site of injection.   The skin was prepped with alcohol and ethyl chloride was sprayed at the injection site.  A 21-gauge needle was used to inject 40 mg of Depo-Medrol  and 1% lidocaine  (4 cc) into the subacromial space of the right shoulder using a posterolateral approach.  There were no complications.  A sterile bandage was applied.    Follow-up: Return if symptoms worsen or fail to improve.  Subjective:  Chief Complaint  Patient presents with   Shoulder Pain    Left/ getting worse states right one painful too but left is worse has not gone for therapy due to insurance problems     History of Present Illness: Amy Snow is a 58 y.o. female who returns for evaluation of bilateral shoulder pain.  Left is worse than the right.  No recent injury.  I last saw  her about 6 months ago.  At that time, her left shoulder was injected.  We placed a referral for physical therapy.  Unfortunately, she was not contacted about therapy.  She continues to have a lot of pain in the left shoulder.  The pain in the right shoulder is returning.  Review of Systems: No fevers or chills No numbness or tingling No chest pain No shortness of breath No bowel or bladder dysfunction No GI distress No headaches  Objective: BP 107/70   Pulse 83   Ht 4' 11 (1.499 m)   Wt 147 lb (66.7 kg)   LMP 12/05/2015   BMI 29.69 kg/m   Physical Exam:  General: Alert and oriented. and No acute distress. Gait: Normal gait.  Left shoulder without deformity.  Forward flexion is limited to 100 degrees.  4+/5 strength.  Positive Jobe's.  Negative belly press.  Fingers are warm and well-perfused.  Positive drop arm test.   Right shoulder without deformity.  Forward flexion limited to 110 degrees.  Internal rotation to her back pocket.  Positive Jobes.  Positive drop arm test.    IMAGING: I personally ordered and reviewed the following images  No new imaging obtained today.   New Medications:  No orders of the defined types were placed in this encounter.     Oneil DELENA Horde, MD  06/19/2024 11:54 AM

## 2024-06-19 NOTE — Progress Notes (Signed)
 SABRA

## 2024-06-19 NOTE — Patient Instructions (Addendum)
 Instructions Following Joint Injections  In clinic today, you received an injection in one of your joints (sometimes more than one).  Occasionally, you can have some pain at the injection site, this is normal.  You can place ice at the injection site, or take over-the-counter medications such as Tylenol  (acetaminophen ) or Advil  (ibuprofen ).  Please follow all directions listed on the bottle.  If your joint (knee or shoulder) becomes swollen, red or very painful, please contact the clinic for additional assistance.   Two medications were injected, including lidocaine  and a steroid (often referred to as cortisone).  Lidocaine  is effective almost immediately but wears off quickly.  However, the steroid can take a few days to improve your symptoms.  In some cases, it can make your pain worse for a couple of days.  Do not be concerned if this happens as it is common.  You can apply ice or take some over-the-counter medications as needed.   Injections in the same joint cannot be repeated for 3 months.  This helps to limit the risk of an infection in the joint.  If you were to develop an infection in your joint, the best treatment option would be surgery.    Recommend physical therapy for left shoulder.  Referral has been placed.  If you are not improving with physical therapy, I would recommend scheduling a follow-up appointment.

## 2024-06-23 ENCOUNTER — Other Ambulatory Visit: Payer: Self-pay | Admitting: Orthopedic Surgery

## 2024-06-25 ENCOUNTER — Ambulatory Visit: Admitting: Nurse Practitioner

## 2024-06-29 ENCOUNTER — Other Ambulatory Visit: Payer: Self-pay | Admitting: Nurse Practitioner

## 2024-07-01 ENCOUNTER — Other Ambulatory Visit: Payer: Self-pay | Admitting: Orthopedic Surgery

## 2024-07-03 ENCOUNTER — Ambulatory Visit: Attending: Orthopedic Surgery | Admitting: Physical Therapy

## 2024-07-03 ENCOUNTER — Encounter: Payer: Self-pay | Admitting: Physical Therapy

## 2024-07-03 DIAGNOSIS — M25512 Pain in left shoulder: Secondary | ICD-10-CM | POA: Diagnosis present

## 2024-07-03 DIAGNOSIS — M6281 Muscle weakness (generalized): Secondary | ICD-10-CM | POA: Diagnosis present

## 2024-07-03 DIAGNOSIS — M25511 Pain in right shoulder: Secondary | ICD-10-CM | POA: Diagnosis present

## 2024-07-03 DIAGNOSIS — M25612 Stiffness of left shoulder, not elsewhere classified: Secondary | ICD-10-CM | POA: Diagnosis present

## 2024-07-03 DIAGNOSIS — G8929 Other chronic pain: Secondary | ICD-10-CM | POA: Diagnosis present

## 2024-07-03 NOTE — Therapy (Signed)
 OUTPATIENT PHYSICAL THERAPY EVALUATION   Patient Name: Amy Snow MRN: [id number] DOB:02/28/1966, 58 y.o., female Today's Date: 07/03/2024  END OF SESSION:  PT End of Session - 07/03/24 1212     Visit Number 1    Number of Visits 8    Date for PT Re-Evaluation 08/28/24    PT Start Time 1115    PT Stop Time 1200    PT Time Calculation (min) 45 min    Activity Tolerance Patient tolerated treatment well    Behavior During Therapy [facility] for tasks assessed/performed          Past Medical History:  Diagnosis Date   Anxiety and depression    CAD (coronary artery disease)    a. s/p STEMI 2016 s/p DES x 2 PDA with residual dz treated medically. b. [place]  in 2020 with cath s/p DES to long high grade lesion in LAD, residual disease treated medically, EF 50%.   Diabetes mellitus, type II (HCC)    Family history of adverse reaction to anesthesia    PONV   Fibromyalgia    GERD (gastroesophageal reflux disease)    Hyperlipidemia    Hypertension    Ischemic cardiomyopathy    a. EF <25% in 2016, improved to normal thereafter. b. EF 50% in 05/2019.   Myocardial infarct [facility])    august 2016   Past Surgical History:  Procedure Laterality Date   BIOPSY  08/15/2020   Procedure: BIOPSY;  Surgeon: Eartha Angelia Sieving, MD;  Location: [facility];  Service: Gastroenterology;;   CARDIAC CATHETERIZATION N/A 07/05/2015   Procedure: Left Heart Cath and Coronary Angiography;  Surgeon: Dorn JINNY Lesches, MD;  Location: [facility] INVASIVE CV LAB;  Service: Cardiovascular;  Laterality: N/A;   CESAREAN SECTION     COLONOSCOPY WITH PROPOFOL  N/A 08/15/2020   Procedure: COLONOSCOPY WITH PROPOFOL ;  Surgeon: Eartha Angelia Sieving, MD;  Location: [facility];  Service: Gastroenterology;  Laterality: N/A;  1230   CORONARY STENT INTERVENTION N/A 05/24/2019   Procedure: CORONARY STENT INTERVENTION;  Surgeon: Lesches Dorn JINNY, MD;  Location: [facility] INVASIVE CV LAB;  Service: Cardiovascular;  Laterality: N/A;    ESOPHAGEAL DILATION N/A 08/15/2020   Procedure: ESOPHAGEAL DILATION;  Surgeon: Eartha Angelia Sieving, MD;  Location: [facility];  Service: Gastroenterology;  Laterality: N/A;   ESOPHAGOGASTRODUODENOSCOPY (EGD) WITH PROPOFOL  N/A 08/15/2020   Procedure: ESOPHAGOGASTRODUODENOSCOPY (EGD) WITH PROPOFOL ;  Surgeon: Eartha Angelia Sieving, MD;  Location: [facility];  Service: Gastroenterology;  Laterality: N/A;   HERNIA REPAIR Left    x2 ventral and inguinal   LEFT HEART CATH AND CORONARY ANGIOGRAPHY N/A 05/24/2019   Procedure: LEFT HEART CATH AND CORONARY ANGIOGRAPHY;  Surgeon: Lesches Dorn JINNY, MD;  Location: [facility] INVASIVE CV LAB;  Service: Cardiovascular;  Laterality: N/A;   LEFT HEART CATH AND CORONARY ANGIOGRAPHY N/A 05/05/2021   Procedure: LEFT HEART CATH AND CORONARY ANGIOGRAPHY;  Surgeon: Burnard Debby LABOR, MD;  Location: [facility] INVASIVE CV LAB;  Service: Cardiovascular;  Laterality: N/A;   POLYPECTOMY  08/15/2020   Procedure: POLYPECTOMY;  Surgeon: Eartha Angelia Sieving, MD;  Location: [facility];  Service: Gastroenterology;;   TONSILLECTOMY     TUBAL LIGATION     Patient Active Problem List   Diagnosis Date Noted   Hyperkalemia 06/18/2023   AKI (acute kidney injury) (HCC) 06/16/2023   Sepsis (HCC) 07/24/2022   CAP (community acquired pneumonia) 07/24/2022   Elevated troponin    Chest pain 05/04/2021   Dysphagia 08/11/2020   Chronic diarrhea  08/11/2020   Screening for colorectal cancer 08/11/2020   GERD (gastroesophageal reflux disease) 08/11/2020   Unstable angina (HCC) 05/25/2019   CAD in native artery 05/24/2019   Diabetes mellitus (HCC) 07/06/2015   Acute heart failure (HCC) 07/06/2015   Essential hypertension 07/06/2015   Hyperlipidemia 07/06/2015   Fibromyalgia 07/06/2015   Acute ST elevation myocardial infarction (STEMI) involving other coronary artery of anterior wall (HCC) 07/05/2015   ST elevation myocardial infarction (STEMI) of inferior wall (HCC)    Depression  with anxiety 10/10/2013    PCP: Alston Silvio BROCKS, FNP   REFERRING PROVIDER: Onesimo Oneil LABOR, MD   REFERRING DIAG: [phone] (ICD-10-CM) - Chronic left shoulder pain   Rationale for Evaluation and Treatment:  Rehabiliation  THERAPY DIAG:  Chronic left shoulder pain  Chronic right shoulder pain  Muscle weakness (generalized)  Stiffness of left shoulder, not elsewhere classified  ONSET DATE: Over a year    SUBJECTIVE:                                                                                                                                                                                           SUBJECTIVE STATEMENT: Both of her shoulders hurt but her left is worse.  She has known RTC tear on her right side so she has been compensating and having to do more with her left shoulder which now hurts more that her right. She did get recent injections helped at first then then second one did not seem to help. 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 She did try PT at another company in the past but had a bad reaction to the TENS therapy and has burn marks from this   PERTINENT HISTORY:  See above PMH  PAIN:  NPRS scale: 9/10 for left shoulder, 8/10 for right shoulder Pain location:posterior-lateral shoulder Pain description: constant, achy, sharp Aggravating factors: picking up babies, reaching up, sleeping Relieving factors: holding her shoulder, heat   PRECAUTIONS: ,  None  RED FLAGS: None   WEIGHT BEARING RESTRICTIONS:  No  FALLS:  Has patient fallen in last 6 months? No   OCCUPATION:  Works at daycare  PLOF:  Independent  PATIENT GOALS:  Reduce pain  OBJECTIVE:  Note: Objective measures were completed at Evaluation unless otherwise noted.  DIAGNOSTIC FINDINGS:  MRI Right shoulder 2023 IMPRESSION: 1. High-grade partial-thickness (> 75%) articular surface tear of the supraspinatus tendon measuring approximately 1.3 cm in AP dimension  approximately 1.4 cm from the footprint.   2. Tendinopathy of the infraspinatus and subscapularis without discrete tear.   3. Moderate acromioclavicular osteoarthritis with small subacromial  osteophytes.   4.  No evidence of fracture or dislocation.  PATIENT SURVEYS:  Patient-Specific Activity Scoring Scheme  0 represents "unable to perform." 10 represents "able to perform at prior level. 0 1 2 3 4 5 6 7 8 9  10 (Date and Score)   Activity Eval     1. sleeping 1     2. Reaching up  2    3. Picking up babies 5   4.    5.    Score 2.66/10    Total score = sum of the activity scores/number of activities Minimum detectable change (90%CI) for average score = 2 points Minimum detectable change (90%CI) for single activity score = 3 points     EDEMA:  No  POSTURE:  rounded shoulders   UPPER EXTREMITY ROM:  Active ROM/Passive ROM Right eval Left eval  Shoulder flexion 110 85/135  Shoulder extension 115 90/130  Shoulder abduction    Shoulder adduction    Shoulder extension    Shoulder internal rotation L2 reaching behind back Left illiac crest reaching behind back  Shoulder external rotation C3 reaching behind head Lt ear reaching behind head  Elbow flexion    Elbow extension    Wrist flexion    Wrist extension    Wrist ulnar deviation    Wrist radial deviation    Wrist pronation    Wrist supination     (Blank rows = not tested)   UPPER EXTREMITY MMT:  MMT Right eval Left eval  Shoulder flexion 4 4  Shoulder extension    Shoulder abduction 4 4  Shoulder adduction    Shoulder extension    Shoulder internal rotation 4 4  Shoulder external rotation 3 3  Middle trapezius    Lower trapezius    Elbow flexion 4+ 4+  Elbow extension 4+ 4+  Wrist flexion    Wrist extension    Wrist ulnar deviation    Wrist radial deviation    Wrist pronation    Wrist supination    Grip strength     (Blank rows = not tested)    SPECIAL TESTS:  Upper Extremity  Rotator cuff assessment: Drop arm test: negative, Belly press test: negative, and + pain with resisted ER movements Slightly decreased GH mobility with inferior glides                                                                                                                        TREATMENT DATE:  Eval HEP creation and review with demonstration and trial set preformed, see below for details KT tape to left shoulder 2 I strips around deltoid with posterior pull and one horizontal I strip across upper trap with horizontal pull Modalities: US  to left posterior-lateral shoulder X 8 min 1.5 w/cm2, 100%, 1 mhz frequency with Biofreeze    PATIENT EDUCATION: Education details: HEP, PT plan of care, selfcare Person educated: Patient Education method: Explanation, Demonstration, Verbal cues, and Handouts Education comprehension: verbalized understanding, further  education recommended   HOME EXERCISE PROGRAM: Access Code: [email or web address] URL: [email or web address] Date: 07/03/2024 Prepared by: Redell Moose  Exercises - Standing Shoulder Posterior Capsule Stretch  - 2 x daily - 3-4 x weekly - 1 sets - 10 reps - 5 sec hold - Doorway Pec Stretch at 90 Degrees Abduction  - 2 x daily - 3-4 x weekly - 1 sets - 10 reps - 5 sec hold - Supine Shoulder Flexion Extension AAROM with Dowel  - 2 x daily - 6 x weekly - 1 sets - 10 reps - 5 sec hold - Supine Shoulder Abduction AAROM with Dowel  - 2 x daily - 6 x weekly - 1 sets - 10 reps - 5 sec hold - Shoulder External Rotation and Scapular Retraction with Resistance  - 2 x daily - 6 x weekly - 2 sets - 10 reps - Standing Shoulder Row with Anchored Resistance  - 2 x daily - 6 x weekly - 2 sets - 10 reps  ASSESSMENT:  CLINICAL IMPRESSION: Patient referred to PT for Chronic shoulder pain. She does have RTC tear on right shoulder confirmed on MRI from 2023. However her left shoulder is worse in terms of pain and stiffness. She has not had imaging  for the left yet.  Patient will benefit from skilled PT to improve overall function and to address impairments and limitations listed below.  OBJECTIVE IMPAIRMENTS: decreased activity tolerance for ADL's,decreased mobility, decreased ROM, decreased strength, impaired flexibility, impaired UE use, and pain.  ACTIVITY LIMITATIONS: lifting, sleeping, driving, reaching, carry, occupation  PERSONAL FACTORS: see above PMH  also affecting patient's functional outcome.  REHAB POTENTIAL: Good  CLINICAL DECISION MAKING: Stable/uncomplicated  EVALUATION COMPLEXITY: Low    GOALS: Short term PT Goals Target date: 07/31/2024   Pt will be I and compliant with HEP. Baseline:  Goal status: New Pt will decrease pain by 25% overall Baseline: Goal status: New  Long term PT goals Target date:08/14/2024   Pt will improve bilat shoulder ROM to [facility] at least 135 degrees of flexion/scaption to improve functional mobility Baseline: Goal status: New Pt will improve shoulder strength to at least 4+/5 MMT to improve functional strength Baseline: Goal status: New Pt will improve Patient specific functional scale (PSFS) to at least 6/10 to show improved function level Baseline: Goal status: New Pt will reduce pain to overall less than 3/10 with usual activity and work activity. Baseline: Goal status: New PLAN: PT FREQUENCY: 1-2 times per week   PT DURATION: 4-6 weeks  PLANNED INTERVENTIONS  97110-Therapeutic exercises, 97530- Therapeutic activity, 97112- Neuromuscular re-education, 97535- Self Care, [id number]- Manual therapy, 97035- Ultrasound, and 97033- Ionotophoresis 4mg /ml Dexamethasone  PLAN FOR NEXT SESSION:How was US  and KT tape, review HEP  She did try PT at another company in the past but had a bad reaction to the TENS therapy and has burn marks from this so no TENS here  NEXT MD VISIT: not scheduled at eval  Redell JONELLE Moose, PT,DPT 07/03/2024, 12:13 PM

## 2024-07-16 ENCOUNTER — Ambulatory Visit: Admitting: Physical Therapy

## 2024-07-25 ENCOUNTER — Encounter: Payer: Self-pay | Admitting: Physical Therapy

## 2024-07-25 ENCOUNTER — Ambulatory Visit: Attending: Orthopedic Surgery | Admitting: Physical Therapy

## 2024-07-25 DIAGNOSIS — M25511 Pain in right shoulder: Secondary | ICD-10-CM | POA: Diagnosis present

## 2024-07-25 DIAGNOSIS — M6281 Muscle weakness (generalized): Secondary | ICD-10-CM | POA: Diagnosis present

## 2024-07-25 DIAGNOSIS — M25612 Stiffness of left shoulder, not elsewhere classified: Secondary | ICD-10-CM | POA: Insufficient documentation

## 2024-07-25 DIAGNOSIS — G8929 Other chronic pain: Secondary | ICD-10-CM | POA: Insufficient documentation

## 2024-07-25 DIAGNOSIS — M25512 Pain in left shoulder: Secondary | ICD-10-CM | POA: Insufficient documentation

## 2024-07-25 NOTE — Therapy (Signed)
 OUTPATIENT PHYSICAL THERAPY TREATMENT   Patient Name: Amy Snow MRN: [id number] DOB:April 23, 1966, 58 y.o., female Today's Date: 07/25/2024  END OF SESSION:  PT End of Session - 07/25/24 1149     Visit Number 2    Number of Visits 8    Date for PT Re-Evaluation 08/28/24    PT Start Time 1150    PT Stop Time 1230    PT Time Calculation (min) 40 min    Activity Tolerance Patient tolerated treatment well    Behavior During Therapy [facility] for tasks assessed/performed           Past Medical History:  Diagnosis Date   Anxiety and depression    CAD (coronary artery disease)    a. s/p STEMI 2016 s/p DES x 2 PDA with residual dz treated medically. b. [place]  in 2020 with cath s/p DES to long high grade lesion in LAD, residual disease treated medically, EF 50%.   Diabetes mellitus, type II (HCC)    Family history of adverse reaction to anesthesia    PONV   Fibromyalgia    GERD (gastroesophageal reflux disease)    Hyperlipidemia    Hypertension    Ischemic cardiomyopathy    a. EF <25% in 2016, improved to normal thereafter. b. EF 50% in 05/2019.   Myocardial infarct [facility])    august 2016   Past Surgical History:  Procedure Laterality Date   BIOPSY  08/15/2020   Procedure: BIOPSY;  Surgeon: Eartha Angelia Sieving, MD;  Location: [facility];  Service: Gastroenterology;;   CARDIAC CATHETERIZATION N/A 07/05/2015   Procedure: Left Heart Cath and Coronary Angiography;  Surgeon: Dorn JINNY Lesches, MD;  Location: [facility] INVASIVE CV LAB;  Service: Cardiovascular;  Laterality: N/A;   CESAREAN SECTION     COLONOSCOPY WITH PROPOFOL  N/A 08/15/2020   Procedure: COLONOSCOPY WITH PROPOFOL ;  Surgeon: Eartha Angelia Sieving, MD;  Location: [facility];  Service: Gastroenterology;  Laterality: N/A;  1230   CORONARY STENT INTERVENTION N/A 05/24/2019   Procedure: CORONARY STENT INTERVENTION;  Surgeon: Lesches Dorn JINNY, MD;  Location: [facility] INVASIVE CV LAB;  Service: Cardiovascular;  Laterality: N/A;    ESOPHAGEAL DILATION N/A 08/15/2020   Procedure: ESOPHAGEAL DILATION;  Surgeon: Eartha Angelia Sieving, MD;  Location: [facility];  Service: Gastroenterology;  Laterality: N/A;   ESOPHAGOGASTRODUODENOSCOPY (EGD) WITH PROPOFOL  N/A 08/15/2020   Procedure: ESOPHAGOGASTRODUODENOSCOPY (EGD) WITH PROPOFOL ;  Surgeon: Eartha Angelia Sieving, MD;  Location: [facility];  Service: Gastroenterology;  Laterality: N/A;   HERNIA REPAIR Left    x2 ventral and inguinal   LEFT HEART CATH AND CORONARY ANGIOGRAPHY N/A 05/24/2019   Procedure: LEFT HEART CATH AND CORONARY ANGIOGRAPHY;  Surgeon: Lesches Dorn JINNY, MD;  Location: [facility] INVASIVE CV LAB;  Service: Cardiovascular;  Laterality: N/A;   LEFT HEART CATH AND CORONARY ANGIOGRAPHY N/A 05/05/2021   Procedure: LEFT HEART CATH AND CORONARY ANGIOGRAPHY;  Surgeon: Burnard Debby LABOR, MD;  Location: [facility] INVASIVE CV LAB;  Service: Cardiovascular;  Laterality: N/A;   POLYPECTOMY  08/15/2020   Procedure: POLYPECTOMY;  Surgeon: Eartha Angelia Sieving, MD;  Location: [facility];  Service: Gastroenterology;;   TONSILLECTOMY     TUBAL LIGATION     Patient Active Problem List   Diagnosis Date Noted   Hyperkalemia 06/18/2023   AKI (acute kidney injury) (HCC) 06/16/2023   Sepsis (HCC) 07/24/2022   CAP (community acquired pneumonia) 07/24/2022   Elevated troponin    Chest pain 05/04/2021   Dysphagia 08/11/2020   Chronic  diarrhea 08/11/2020   Screening for colorectal cancer 08/11/2020   GERD (gastroesophageal reflux disease) 08/11/2020   Unstable angina (HCC) 05/25/2019   CAD in native artery 05/24/2019   Diabetes mellitus (HCC) 07/06/2015   Acute heart failure (HCC) 07/06/2015   Essential hypertension 07/06/2015   Hyperlipidemia 07/06/2015   Fibromyalgia 07/06/2015   Acute ST elevation myocardial infarction (STEMI) involving other coronary artery of anterior wall (HCC) 07/05/2015   ST elevation myocardial infarction (STEMI) of inferior wall (HCC)    Depression  with anxiety 10/10/2013    PCP: Alston Silvio BROCKS, FNP   REFERRING PROVIDER: Onesimo Oneil LABOR, MD   REFERRING DIAG: [phone] (ICD-10-CM) - Chronic left shoulder pain   Rationale for Evaluation and Treatment:  Rehabiliation  THERAPY DIAG:  Chronic left shoulder pain  Chronic right shoulder pain  Muscle weakness (generalized)  Stiffness of left shoulder, not elsewhere classified  ONSET DATE: Over a year    SUBJECTIVE:                                                                                                                                                                                           SUBJECTIVE STATEMENT: Pt states she was sick last week. Shoulder is so so. No pain at rest. Pt reports she did like the taping.   From eval: Both of her shoulders hurt but her left is worse.  She has known RTC tear on her right side so she has been compensating and having to do more with her left shoulder which now hurts more that her right. She did get recent injections helped at first then then second one did not seem to help. 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 She did try PT at another company in the past but had a bad reaction to the TENS therapy and has burn marks from this   PERTINENT HISTORY:  See above PMH  PAIN:  NPRS scale: 9/10 for left shoulder, 8/10 for right shoulder Pain location:posterior-lateral shoulder Pain description: constant, achy, sharp Aggravating factors: picking up babies, reaching up, sleeping Relieving factors: holding her shoulder, heat   PRECAUTIONS: ,  None  RED FLAGS: None   WEIGHT BEARING RESTRICTIONS:  No  FALLS:  Has patient fallen in last 6 months? No   OCCUPATION:  Works at daycare  PLOF:  Independent  PATIENT GOALS:  Reduce pain  OBJECTIVE:  Note: Objective measures were completed at Evaluation unless otherwise noted.  DIAGNOSTIC FINDINGS:  MRI Right shoulder 2023 IMPRESSION: 1. High-grade  partial-thickness (> 75%) articular surface tear of the supraspinatus tendon measuring approximately 1.3 cm in AP dimension  approximately 1.4 cm from the footprint.   2. Tendinopathy of the infraspinatus and subscapularis without discrete tear.   3. Moderate acromioclavicular osteoarthritis with small subacromial osteophytes.   4.  No evidence of fracture or dislocation.  PATIENT SURVEYS:  Patient-Specific Activity Scoring Scheme  0 represents "unable to perform." 10 represents "able to perform at prior level. 0 1 2 3 4 5 6 7 8 9  10 (Date and Score)   Activity Eval     1. sleeping 1     2. Reaching up  2    3. Picking up babies 5   4.    5.    Score 2.66/10    Total score = sum of the activity scores/number of activities Minimum detectable change (90%CI) for average score = 2 points Minimum detectable change (90%CI) for single activity score = 3 points     EDEMA:  No  POSTURE:  rounded shoulders   UPPER EXTREMITY ROM:  Active ROM/Passive ROM Right eval Left eval  Shoulder flexion 110 85/135  Shoulder extension 115 90/130  Shoulder abduction    Shoulder adduction    Shoulder extension    Shoulder internal rotation L2 reaching behind back Left illiac crest reaching behind back  Shoulder external rotation C3 reaching behind head Lt ear reaching behind head  Elbow flexion    Elbow extension    Wrist flexion    Wrist extension    Wrist ulnar deviation    Wrist radial deviation    Wrist pronation    Wrist supination     (Blank rows = not tested)   UPPER EXTREMITY MMT:  MMT Right eval Left eval  Shoulder flexion 4 4  Shoulder extension    Shoulder abduction 4 4  Shoulder adduction    Shoulder extension    Shoulder internal rotation 4 4  Shoulder external rotation 3 3  Middle trapezius    Lower trapezius    Elbow flexion 4+ 4+  Elbow extension 4+ 4+  Wrist flexion    Wrist extension    Wrist ulnar deviation    Wrist radial deviation     Wrist pronation    Wrist supination    Grip strength     (Blank rows = not tested)    SPECIAL TESTS:  Upper Extremity Rotator cuff assessment: Drop arm test: negative, Belly press test: negative, and + pain with resisted ER movements Slightly decreased GH mobility with inferior glides                                                                                                                        TREATMENT DATE:  07/25/24 Seated shoulder flexion table AAROM x10 Seated shoulder scaption table AAROM x10 Seated shoulder ER table AROM x10 Seated shoulder horizontal shoulder abd table AAROM x10 Supine shoulder flexion AAROM with dowel x10 Supine shoulder abd AAROM with dowel x10 Standing row red TB 2x10 Standing shoulder ext red TB 2x10 Shoulder ER red TB x10 with  towel squeeze to decrease deltoid activation Gentle STM & TPR L pec minor and grade II Glenohumeral PAs KT tape to left shoulder 2 I strips from coracoid process around deltoid with posterior pull and one horizontal I strip across upper trap with horizontal pull   Eval HEP creation and review with demonstration and trial set preformed, see below for details KT tape to left shoulder 2 I strips around deltoid with posterior pull and one horizontal I strip across upper trap with horizontal pull Modalities: US  to left posterior-lateral shoulder X 8 min 1.5 w/cm2, 100%, 1 mhz frequency with Biofreeze    PATIENT EDUCATION: Education details: HEP, PT plan of care, selfcare Person educated: Patient Education method: Explanation, Demonstration, Verbal cues, and Handouts Education comprehension: verbalized understanding, further education recommended   HOME EXERCISE PROGRAM: Access Code: [email or web address] URL: [email or web address] Date: 07/03/2024 Prepared by: Redell Moose  Exercises - Standing Shoulder Posterior Capsule Stretch  - 2 x daily - 3-4 x weekly - 1 sets - 10 reps - 5 sec hold - Doorway Pec Stretch at  90 Degrees Abduction  - 2 x daily - 3-4 x weekly - 1 sets - 10 reps - 5 sec hold - Supine Shoulder Flexion Extension AAROM with Dowel  - 2 x daily - 6 x weekly - 1 sets - 10 reps - 5 sec hold - Supine Shoulder Abduction AAROM with Dowel  - 2 x daily - 6 x weekly - 1 sets - 10 reps - 5 sec hold - Shoulder External Rotation and Scapular Retraction with Resistance  - 2 x daily - 6 x weekly - 2 sets - 10 reps - Standing Shoulder Row with Anchored Resistance  - 2 x daily - 6 x weekly - 2 sets - 10 reps  ASSESSMENT:  CLINICAL IMPRESSION: Treatment focused on reviewing HEP. Worked on proper activation and strengthening of scapular and posterior shoulder muscles. Pt had liked taping so performed again today -- did it a little different with one I strip wrapping around deltoid starting from coracoid process and then along spine of scapula.    From eval: Patient referred to PT for Chronic shoulder pain. She does have RTC tear on right shoulder confirmed on MRI from 2023. However her left shoulder is worse in terms of pain and stiffness. She has not had imaging for the left yet.  Patient will benefit from skilled PT to improve overall function and to address impairments and limitations listed below.  OBJECTIVE IMPAIRMENTS: decreased activity tolerance for ADL's,decreased mobility, decreased ROM, decreased strength, impaired flexibility, impaired UE use, and pain.  ACTIVITY LIMITATIONS: lifting, sleeping, driving, reaching, carry, occupation  PERSONAL FACTORS: see above PMH  also affecting patient's functional outcome.  REHAB POTENTIAL: Good  CLINICAL DECISION MAKING: Stable/uncomplicated  EVALUATION COMPLEXITY: Low    GOALS: Short term PT Goals Target date: 07/31/2024   Pt will be I and compliant with HEP. Baseline:  Goal status: New Pt will decrease pain by 25% overall Baseline: Goal status: New  Long term PT goals Target date:08/14/2024   Pt will improve bilat shoulder ROM to [facility] at  least 135 degrees of flexion/scaption to improve functional mobility Baseline: Goal status: New Pt will improve shoulder strength to at least 4+/5 MMT to improve functional strength Baseline: Goal status: New Pt will improve Patient specific functional scale (PSFS) to at least 6/10 to show improved function level Baseline: Goal status: New Pt will reduce pain to overall less than 3/10 with usual activity and work activity.  Baseline: Goal status: New PLAN: PT FREQUENCY: 1-2 times per week   PT DURATION: 4-6 weeks  PLANNED INTERVENTIONS  97110-Therapeutic exercises, 97530- Therapeutic activity, 97112- Neuromuscular re-education, 97535- Self Care, [id number]- Manual therapy, 97035- Ultrasound, and 97033- Ionotophoresis 4mg /ml Dexamethasone  PLAN FOR NEXT SESSION:How was US  and KT tape, review HEP  She did try PT at another company in the past but had a bad reaction to the TENS therapy and has burn marks from this so no TENS here  NEXT MD VISIT: not scheduled at eval  Breeann Reposa April Ma L Oluwatomisin Hustead, PT,DPT 07/25/2024, 11:50 AM

## 2024-07-26 ENCOUNTER — Other Ambulatory Visit: Payer: Self-pay

## 2024-07-26 MED ORDER — CARVEDILOL 25 MG PO TABS: 25.0000 mg | ORAL_TABLET | Freq: Two times a day (BID) | ORAL | 0 refills | Status: DC

## 2024-07-26 MED ORDER — CLOPIDOGREL BISULFATE 75 MG PO TABS: 75.0000 mg | ORAL_TABLET | Freq: Every day | ORAL | 0 refills | Status: DC

## 2024-08-01 ENCOUNTER — Ambulatory Visit: Admitting: Physical Therapy

## 2024-08-08 ENCOUNTER — Other Ambulatory Visit: Payer: Self-pay | Admitting: Orthopedic Surgery

## 2024-08-08 ENCOUNTER — Other Ambulatory Visit: Payer: Self-pay | Admitting: Nurse Practitioner

## 2024-08-10 ENCOUNTER — Ambulatory Visit: Admitting: Physical Therapy

## 2024-08-13 ENCOUNTER — Ambulatory Visit: Admitting: Physical Therapy

## 2024-08-17 ENCOUNTER — Encounter: Payer: Self-pay | Admitting: Nurse Practitioner

## 2024-08-17 ENCOUNTER — Ambulatory Visit: Attending: Nurse Practitioner | Admitting: Nurse Practitioner

## 2024-08-17 VITALS — BP 112/60 | HR 70 | Ht 59.3 in | Wt 149.4 lb

## 2024-08-17 DIAGNOSIS — E785 Hyperlipidemia, unspecified: Secondary | ICD-10-CM

## 2024-08-17 DIAGNOSIS — Z8679 Personal history of other diseases of the circulatory system: Secondary | ICD-10-CM | POA: Diagnosis not present

## 2024-08-17 DIAGNOSIS — I10 Essential (primary) hypertension: Secondary | ICD-10-CM | POA: Diagnosis not present

## 2024-08-17 DIAGNOSIS — I2510 Atherosclerotic heart disease of native coronary artery without angina pectoris: Secondary | ICD-10-CM | POA: Diagnosis not present

## 2024-08-17 MED ORDER — ASPIRIN 81 MG PO CHEW: 81.0000 mg | CHEWABLE_TABLET | Freq: Every day | ORAL | 3 refills | Status: AC

## 2024-08-17 MED ORDER — CARVEDILOL 25 MG PO TABS: 25.0000 mg | ORAL_TABLET | Freq: Two times a day (BID) | ORAL | 3 refills | Status: AC

## 2024-08-17 MED ORDER — CLOPIDOGREL BISULFATE 75 MG PO TABS: 75.0000 mg | ORAL_TABLET | Freq: Every day | ORAL | 3 refills | Status: AC

## 2024-08-17 MED ORDER — HYDROCHLOROTHIAZIDE 12.5 MG PO CAPS: ORAL_CAPSULE | ORAL | 3 refills | Status: AC

## 2024-08-17 MED ORDER — OLMESARTAN MEDOXOMIL 5 MG PO TABS: 5.0000 mg | ORAL_TABLET | Freq: Every day | ORAL | 3 refills | Status: AC

## 2024-08-17 NOTE — Patient Instructions (Addendum)
 Medication Instructions:  Your physician recommends that you continue on your current medications as directed. Please refer to the Current Medication list given to you today.  Labwork: In 6-8 weeks in [employer]   Testing/Procedures: None   Follow-Up: Your physician recommends that you schedule a follow-up appointment in: 1 Year   Any Other Special Instructions Will Be Listed Below (If Applicable).  If you need a refill on your cardiac medications before your next appointment, please call your pharmacy.

## 2024-08-17 NOTE — Progress Notes (Signed)
 Cardiology Office Note:    Date:  08/17/2024 ID:  Amy Snow, DOB 16-Nov-1966, MRN [id number] PCP:  Alston Silvio BROCKS, FNP [facility] Providers Cardiologist:  Dorn Lesches, MD    Referring MD: Alston Silvio BROCKS, FNP   CC: Here for follow-up  History of Present Illness:    Amy Snow is a 58 y.o. female with a PMH of CAD, hyperlipidemia, hypertension, type 2 diabetes, mild anemia, fibromyalgia, and anxiety, who presents today for overdue follow-up.  Previous history of STEMI and drug-eluting stent x 2 to PDA in 2016, received PCI/DES to LAD and medically treatment in 2020.   I last saw patient in June 2024.  She was doing well at that time overall.  Today she is here for overdue follow-up. Tells me she is doing well.  Denies any acute cardiac complaints or issues.  She tells me she has returned to taking atorvastatin  and Lopid  to lower her cholesterol.  Tolerating these medications well. Denies any chest pain, shortness of breath, palpitations, syncope, presyncope, dizziness, orthopnea, PND, swelling or significant weight changes, acute bleeding, or claudication.  SH: Administrator, sports, works and cares for babies  ROS:     Please see the history of present illness.    All other systems reviewed and are negative.  EKGs/Labs/Other Studies Reviewed:    The following studies were reviewed today:   EKG:   EKG Interpretation Date/Time:  Friday August 17 2024 11:14:57 EDT Ventricular Rate:  70 PR Interval:  142 QRS Duration:  82 QT Interval:  414 QTC Calculation: 447 R Axis:   36  Text Interpretation: Normal sinus rhythm Cannot rule out Anterior infarct , age undetermined When compared with ECG of 16-Jun-2023 12:06, PREVIOUS ECG IS PRESENT Confirmed by Miriam Norris [phone]) on 08/17/2024 11:20:28 AM   2D complete echo on May 06, 2021:  1. Left ventricular ejection fraction, by estimation, is 60 to 65%. The  left ventricle has normal function. The left ventricle  has no regional  wall motion abnormalities. There is moderate concentric left ventricular  hypertrophy. Left ventricular  diastolic parameters are consistent with Grade I diastolic dysfunction  (impaired relaxation).   2. Right ventricular systolic function is normal. The right ventricular  size is normal. Tricuspid regurgitation signal is inadequate for assessing  PA pressure.   3. The mitral valve is grossly normal. No evidence of mitral valve  regurgitation. No evidence of mitral stenosis.   4. The aortic valve is tricuspid. There is mild calcification of the  aortic valve. There is mild thickening of the aortic valve. Aortic valve  regurgitation is not visualized. Mild aortic valve sclerosis is present,  with no evidence of aortic valve  stenosis.   5. The inferior vena cava is dilated in size with >50% respiratory  variability, suggesting right atrial pressure of 8 mmHg.   Comparison(s): A prior study was performed on 06/14/2019. No significant  change from prior study. Prior images reviewed side by side.  Left heart cath on May 05, 2021: Previously placed RPDA stent (unknown type) is widely patent. Prox RCA lesion is 30% stenosed. Mid RCA lesion is 30% stenosed. Previously placed Prox LAD to Mid LAD stent (unknown type) is widely patent. 1st Mrg lesion is 80% stenosed. Prox Cx to Mid Cx lesion is 70% stenosed. Mid Cx to Dist Cx lesion is 80% stenosed. 2nd RPL lesion is 30% stenosed.   Widely patent previously placed stents in the proximal to mid LAD and PDA of the  right coronary artery.  The LAD is otherwise normal and the RCA has mild nonobstructive 30% proximal and mid smooth stenosis.   The left circumflex vessel is very  small caliber with improvement in prior stenoses from 95 and 90% to 70% and 80%.   Normal LV function with EF estimated 55 to 60% without focal segmental wall motion abnormality.  LVEDP 12 mmHg.   RECOMMENDATION: Continue DAPT with aspirin /Plavix .    Resumption of high potency statin therapy with atorvastatin  80 mg and  add Vascepa 2 capsules twice a day and fenofibrate  with marked triglyceride elevation of 851.  We will increase medical therapy with isosorbide  mononitrate to 60 mg and carvedilol  recently increased to 25 mg twice a day.  Plan initial medical management unless recurrent symptomatology.  Left heart cath on May 24, 2019: Successful approximately PCI and drug-eluting stenting of a long high-grade lesion probably responsible for her accelerated symptoms over the last several months.  The circumflex was a relatively small vessel with long segmental disease.  The right was a large vessel with a patent PDA stent and some moderate disease in the mid PDA and PLA.  LV function was preserved although there was mild inferoapical hypokinesia with a visual EF estimated at approximate 50%.  Patient will need uninterrupted dual antiplatelet platelet therapy for 1 year along with risk factor modification.  She will be gently hydrated overnight and discharged home in the morning.   Myoview on August 20, 2015: The left ventricular ejection fraction is normal (55-65%). Nuclear stress EF: 61%. There was no ST segment deviation noted during stress. This is a low risk study. Findings consistent with ischemia.   Low risk stress nuclear study with a moderate size, medium intensity, partially reversible inferior defect consistent with prior inferior MI vs diaphragmatic attenuation and mild inferior ischemia towards the apex; EF 61 with normal wall motion.  2D echo on July 06, 2015: - Left ventricle: The cavity size was normal. Wall thickness was    normal. Systolic function was moderately reduced. The estimated    ejection fraction was in the range of 35% to 40%. Akinesis of the    apicalanteroseptal, inferior, and apical myocardium. Doppler    parameters are consistent with abnormal left ventricular    relaxation (grade 1 diastolic dysfunction).    Left heart cath on July 05, 2015: Ms Delancey had three-vessel disease with severe LV dysfunction. Her infarct related artery was occluded PDA although she has disease in the past. Branch, circumflex obtuse marginal branch and LAD and diagonal branches. Her ejection fraction was approximately 20%. The procedure was performed radially. She will received radial cocktail. She received weight based bivalirudin  and Brilenta. The PDA was crossed with a prolonged or guidewire, angioplasty with a 2 mm balloon and stented with a 2.25 x 16 mm long synergy drug-eluting stent at 18 atm (2.4 mm) resulting reduction with occlusion to 0% residual TIMI-3 flow. Total contrast used during the case was 135 mL. The ACT was 331. The door to balloon time was 36 minutes.   Has has three-vessel disease with severe LV dysfunction. Her infarct vessel was the PDA which was stented with a drug-eluting stent. She has residual disease in the LAD, diagonal branch and circumflex marginal branch. It appears that her LV dysfunction is out of proportion to the degree of CAD. She'll need to be treated with the routine post-STEMI medications including aspirin , Proventil , beta blocker, ACE inhibitor and high-dose statin drug. Consideration will be given for staged  intervention of the circumflex, LAD and diagonal branches. She may require a LIFEVEST  prior to discharge.  Recent Labs: No results found for requested labs within last 365 days.  Recent Lipid Panel    Component Value Date/Time   CHOL 322 (H) 05/05/2021 0315   TRIG 851 (H) 05/05/2021 0315   HDL 29 (L) 05/05/2021 0315   CHOLHDL 11.1 05/05/2021 0315   VLDL UNABLE TO CALCULATE IF TRIGLYCERIDE OVER 400 mg/dL 93/85/7977 9684   LDLCALC UNABLE TO CALCULATE IF TRIGLYCERIDE OVER 400 mg/dL 93/85/7977 9684   LDLDIRECT 123.1 (H) 05/05/2021 0315    Physical Exam:    VS:  BP 112/60   Pulse 70   Ht 4' 11.3 (1.506 m)   Wt 149 lb 6.4 oz (67.8 kg)   LMP 12/05/2015   SpO2 98%    BMI 29.87 kg/m     Wt Readings from Last 3 Encounters:  08/17/24 149 lb 6.4 oz (67.8 kg)  06/19/24 147 lb (66.7 kg)  11/11/23 147 lb (66.7 kg)    GEN: Well nourished, well developed 58 y.o. female in NAD  HEENT: Normal NECK: No JVD; No carotid bruits CARDIAC: S1/S2, RRR, no murmurs, rubs, gallops; 2+ peripheral pulses throughout, strong and equal bilaterally RESPIRATORY:  Clear and diminished to auscultation without rales, wheezing or rhonchi  MUSCULOSKELETAL:  No edema; No deformity  SKIN: Warm and dry NEUROLOGIC:  Alert and oriented x 3, occasional forgetfulness  PSYCHIATRIC:  Normal affect   ASSESSMENT & PLAN:    In order of problems listed above:  CAD Previous history of STEMI, s/p DES x 2 to PDA with medical treatment for residual disease in 2016, received PCI/DES to LAD in medical treatment in 2020. Stable with no anginal symptoms. No indication for ischemic evaluation.  Continue current medication regimen. Heart healthy diet and regular cardiovascular exercise encouraged.  Will obtain FLP/LFT in 6 weeks.  2. History of ischemic cardiomyopathy Stage C, NYHA class I symptoms. EF < 25% in 2016 during the time of her MI.  Most recent echocardiogram 04/2021 revealed EF 60 to 65%.  No RWMA noted along left ventricle with moderate LVH, grade 1 DD. Euvolemic and well compensated on exam. Continue current medication regimen.  Heart healthy, low diet and regular cardiovascular exercise encouraged.   3. Hyperlipidemia Has recently returned to taking Lipitor  and Lopid .  Will obtain FLP/LFT in 6 weeks for evaluation.  Continue current medication regimen.  Heart healthy diet and regular cardiovascular exercise encouraged.   4. Hypertension Blood pressure stable. SBP goal <130.  BP well-controlled at home.  Discussed to monitor BP at home at least 2 hours after medications and sitting for 5-10 minutes and to notify office if SBP is consistently greater than 130.  Continue current medication  regimen.  Heart healthy diet and regular cardiovascular exercise encouraged.    Disposition: Will provide refills per her request. Follow-up with MD/APP in 1 year or sooner if anything changes.   Medication Adjustments/Labs and Tests Ordered: Current medicines are reviewed at length with the patient today.  Concerns regarding medicines are outlined above.  Orders Placed This Encounter  Procedures   Hepatic function panel   Lipid Profile   EKG 12-Lead   Meds ordered this encounter  Medications   aspirin  81 MG chewable tablet    Sig: Chew 1 tablet (81 mg total) by mouth daily.    Dispense:  90 tablet    Refill:  3   carvedilol  (COREG ) 25 MG tablet  Sig: Take 1 tablet (25 mg total) by mouth 2 (two) times daily.    Dispense:  180 tablet    Refill:  3   clopidogrel  (PLAVIX ) 75 MG tablet    Sig: Take 1 tablet (75 mg total) by mouth daily.    Dispense:  90 tablet    Refill:  3    Keep upcoming appointment for additional refills   hydrochlorothiazide  (MICROZIDE ) 12.5 MG capsule    Sig: TAKE 1 CAPSULE BY MOUTH ONCE DAILY-TAKE AN EXTRA CAPSULE DAILY AS NEEDED FOR WEIGHT GAIN OF 3 LBS IN ONE DAY OR 5 LBS IN ONE WEEK    Dispense:  90 capsule    Refill:  3   olmesartan  (BENICAR ) 5 MG tablet    Sig: Take 1 tablet (5 mg total) by mouth daily.    Dispense:  90 tablet    Refill:  3    Patient Instructions  Medication Instructions:  Your physician recommends that you continue on your current medications as directed. Please refer to the Current Medication list given to you today.  Labwork: In 6-8 weeks in [facility]   Testing/Procedures: None   Follow-Up: Your physician recommends that you schedule a follow-up appointment in: 1 Year   Any Other Special Instructions Will Be Listed Below (If Applicable).  If you need a refill on your cardiac medications before your next appointment, please call your pharmacy.   Signed, Almarie Crate, NP

## 2024-08-22 ENCOUNTER — Other Ambulatory Visit: Payer: Self-pay | Admitting: Nurse Practitioner

## 2024-09-06 ENCOUNTER — Other Ambulatory Visit: Payer: Self-pay | Admitting: Orthopedic Surgery

## 2024-09-10 ENCOUNTER — Telehealth: Payer: Self-pay

## 2024-09-10 MED ORDER — TRAMADOL HCL 50 MG PO TABS: 50.0000 mg | ORAL_TABLET | Freq: Two times a day (BID) | ORAL | 0 refills | Status: DC | PRN

## 2024-09-10 NOTE — Telephone Encounter (Signed)
 Tramadol   50 MG Tab   Qty 20 Tablets  Take 1 tablet by mouth every 12 hours as needed.  PATIENT USES [facility]

## 2024-11-05 ENCOUNTER — Other Ambulatory Visit: Payer: Self-pay | Admitting: Orthopedic Surgery

## 2024-12-12 ENCOUNTER — Other Ambulatory Visit: Payer: Self-pay | Admitting: Orthopedic Surgery
